# Patient Record
Sex: Female | Born: 1960 | Race: White | Hispanic: No | Marital: Single
Health system: Midwestern US, Academic
[De-identification: ages and names within clinical notes are randomized; demographics above are authoritative.]

---

## 2017-01-01 MED ORDER — IOPAMIDOL 41 % IT SOLN
2.5 mL | Freq: Once | EPIDURAL | 0 refills | Status: CP
Start: 2017-01-01 — End: ?

## 2017-01-01 MED ORDER — DIAZEPAM 5 MG PO TAB
5 mg | ORAL | 0 refills | Status: DC | PRN
Start: 2017-01-01 — End: 2017-01-01

## 2017-01-01 MED ORDER — NORTRIPTYLINE 10 MG PO CAP
20 mg | ORAL_CAPSULE | Freq: Every evening | ORAL | 2 refills | Status: DC
Start: 2017-01-01 — End: 2017-07-02

## 2017-01-01 MED ORDER — TRIAMCINOLONE ACETONIDE 40 MG/ML IJ SUSP
80 mg | Freq: Once | EPIDURAL | 0 refills | Status: CP
Start: 2017-01-01 — End: ?

## 2017-01-24 MED ORDER — LIDOCAINE 5 % TP PTMD
1 | MEDICATED_PATCH | TOPICAL | 3 refills | 30.00000 days | Status: DC
Start: 2017-01-24 — End: 2018-10-06

## 2017-02-05 MED ORDER — TRIAMCINOLONE ACETONIDE 40 MG/ML IJ SUSP
80 mg | Freq: Once | EPIDURAL | 0 refills | Status: CP
Start: 2017-02-05 — End: ?

## 2017-02-05 MED ORDER — IOPAMIDOL 41 % IT SOLN
2.5 mL | Freq: Once | EPIDURAL | 0 refills | Status: CP
Start: 2017-02-05 — End: ?

## 2017-02-05 MED ORDER — DIAZEPAM 5 MG PO TAB
5 mg | ORAL | 0 refills | Status: DC | PRN
Start: 2017-02-05 — End: 2017-02-06

## 2017-02-20 MED ORDER — PREGABALIN 200 MG PO CAP
200 mg | ORAL_CAPSULE | Freq: Two times a day (BID) | ORAL | 2 refills | Status: DC
Start: 2017-02-20 — End: 2017-05-29

## 2017-03-05 ENCOUNTER — Encounter: Admit: 2017-03-05 | Discharge: 2017-03-05

## 2017-03-05 NOTE — Telephone Encounter
Nurse received v/m from [facility] asking for callback to try to move her follow up visit to sooner date.    Nurse returned call, Kristen Vaughn c/o continued and increased neck pain and headaches, stated the injection she received on 5/15 (C6-C7 ESI) did not help.  Nurse assisted patient with moving up her appointment and will route C-Spine MRI results from 4/3 for Dr. Katrinka BlazingSmith to review [name]- Kristen Vaughn said at her injection appointment he mentioned a possible surgical referral, she would like to get the ball rolling on this if needed.    Nurse to f/u with Kristen Vaughn by phone after Dr. Katrinka BlazingSmith reviews MRI.  Kristen Vaughn v/u and agrees to this plan.

## 2017-03-08 ENCOUNTER — Encounter: Admit: 2017-03-08 | Discharge: 2017-03-08

## 2017-03-08 DIAGNOSIS — M4802 Spinal stenosis, cervical region: Principal | ICD-10-CM

## 2017-03-08 NOTE — Telephone Encounter
[facility] let [facility] know Dr. 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  Patient is agreeable.  Nurse will enter referral & have scheduling contact patient. Pt requested to cancel 6/26 f/u with Dr. Katrinka BlazingSmith at this time.  Pam v/u and expressed satisfaction with this plan.

## 2017-03-12 ENCOUNTER — Encounter: Admit: 2017-03-12 | Discharge: 2017-03-12

## 2017-03-12 NOTE — Telephone Encounter
Neither Dr. Katrinka BlazingSmith or I realized this patient had seen Dr. Debroah LoopArnold for lbp surgical eval in July 2017.  A new referral was provided to Dr. Liam RogersBunch for cervical stenosis.  .  When the patient called to schedule she was told she needs to stay with Dr. Debroah LoopArnold.  The patient was upset, left v/m for the nurse.  Nurse called [facility], explained what happened.  Rinaldo [facility]amela was insistent that she wanted to see Dr. Liam RogersBunch for this appointment.  Email was sent to [facility] to let her know this patient has requested to transfer her care.  Nurse let Rinaldo Cloudamela know [facility] discuss.  Rinaldo CloudPamela v/u.

## 2017-03-19 ENCOUNTER — Encounter: Admit: 2017-03-19 | Discharge: 2017-03-19

## 2017-03-19 NOTE — Telephone Encounter
Attempt to reach patient to discuss referral to Dr. Liam RogersBunch for cervical stenosis. Will assist with scheduling.

## 2017-04-05 ENCOUNTER — Encounter: Admit: 2017-04-05 | Discharge: 2017-04-05

## 2017-04-05 DIAGNOSIS — M4802 Spinal stenosis, cervical region: Principal | ICD-10-CM

## 2017-04-17 ENCOUNTER — Encounter: Admit: 2017-04-17 | Discharge: 2017-04-17

## 2017-04-17 ENCOUNTER — Ambulatory Visit: Admit: 2017-04-17 | Discharge: 2017-04-17

## 2017-04-17 ENCOUNTER — Ambulatory Visit: Admit: 2017-04-17 | Discharge: 2017-04-17 | Payer: MEDICARE

## 2017-04-17 DIAGNOSIS — G959 Disease of spinal cord, unspecified: ICD-10-CM

## 2017-04-17 DIAGNOSIS — J189 Pneumonia, unspecified organism: ICD-10-CM

## 2017-04-17 DIAGNOSIS — K5900 Constipation, unspecified: ICD-10-CM

## 2017-04-17 DIAGNOSIS — G5600 Carpal tunnel syndrome, unspecified upper limb: ICD-10-CM

## 2017-04-17 DIAGNOSIS — I10 Essential (primary) hypertension: ICD-10-CM

## 2017-04-17 DIAGNOSIS — M4802 Spinal stenosis, cervical region: Principal | ICD-10-CM

## 2017-04-17 DIAGNOSIS — M5020 Other cervical disc displacement, unspecified cervical region: ICD-10-CM

## 2017-04-17 DIAGNOSIS — E785 Hyperlipidemia, unspecified: ICD-10-CM

## 2017-04-17 DIAGNOSIS — M797 Fibromyalgia: ICD-10-CM

## 2017-04-17 DIAGNOSIS — M5412 Radiculopathy, cervical region: ICD-10-CM

## 2017-04-17 NOTE — Progress Notes
SPINE CENTER HISTORY AND PHYSICAL      CHIEF COMPLAINT     Chief Complaint   Patient presents with   ??? Neck - Pain          HISTORY OF PRESENT ILLNESS   Kristen Vaughn is a 56 y.o. female.  Is a 56 year old female comes today for further evaluation of neck pain and bilateral arm pain and paresthesias.  Patient notes that this is been going on for quite some time.  She has had a recent intralaminar injection with Dr. Katrinka Blazing without significant long-lasting relief.  Patient notes that she additionally has some pain in her low back.  Patient notes that her neck and upper extremity symptoms are worse.  She has difficulty using her hands for fine motor test.  She has had increasing difficulty with writing and doing her buttons.  She notes that she does have some difficulty with balance as well.  She walks with a single-point cane.  Patient notes that she is trying to stop smoking.  She is down to 2 cigarettes a day.  She notes paresthesias in her thumb index and long finger.  She has significant neck pain at times with pain that radiates down into her right shoulder.  She notes that turning her head really causes the pain to increase.  No previous cervical spine operations patient notes that she has had issues dropping items and holding onto items she did have a recent EMG nerve conduction study completed showing right C6 and C7 radiculopathies in addition to the left C6 radiculopathy    Arm/Leg Pain (%) / Neck/Back Pain (%) -neck and bilateral hand paresthesias and weakness  Previous Spine Operations -  no    NSAIDs/Pain Meds -  Hydrocodone, celebrex  Physical Therapy -  Yes   Injections -  yes          PAST MEDICAL HISTORY     Past Medical History:   Diagnosis Date   ??? Carpal tunnel syndrome    ??? Chest pain    ??? Constipation    ??? Essential hypertension    ??? Fibromyalgia    ??? Hyperlipemia 08/09/2009   ??? Pneumonia        PAST SURGICAL HISTORY     Past Surgical History:   Procedure Laterality Date   ??? HYSTERECTOMY FAMILY HISTORY   family history includes Cancer in her sister and another family member; Coronary Artery Disease in her father, mother, and other family members; Premature Heart Disease in an other family member; Stroke in an other family member.    SOCIAL HISTORY     Social History     Social History   ??? Marital status: Single     Spouse name: N/A   ??? Number of children: N/A   ??? Years of education: N/A     Occupational History   ??? Not on file.     Social History Main Topics   ??? Smoking status: Current Every Day Smoker     Years: 33.00     Types: Cigarettes     Last attempt to quit: 08/08/2009   ??? Smokeless tobacco: Never Used   ??? Alcohol use 0.0 oz/week      Comment: social   ??? Drug use: No   ??? Sexual activity: Not on file     Other Topics Concern   ??? Not on file     Social History Narrative   ??? No narrative on file  ALLERGIES     Allergies   Allergen Reactions   ??? Ascorbic Acid ANAPHYLAXIS   ??? Strawberry UNKNOWN       MEDICATIONS     Current Outpatient Prescriptions:   ???  albuterol (PROAIR HFA) 90 mcg/actuation inhaler, Inhale 2 Puffs by mouth into the lungs every 6 hours as needed for Wheezing or Shortness of Breath. Shake well before use., Disp: , Rfl:   ???  celecoxib (CELEBREX) 200 mg capsule, Take 200 mg by mouth daily., Disp: , Rfl:   ???  desvenlafaxine(+) (PRISTIQ) 50 mg tablet, Take 50 mg by mouth daily., Disp: , Rfl:   ???  HYDROcodone/acetaminophen (NORCO) 5/325 mg tablet, Take 1 tablet by mouth every 4 hours as needed for Pain Indications: PAIN, Disp: 20 tablet, Rfl: 0  ???  HYDROcodone/acetaminophen(+) (NORCO) 10/325 mg tablet, Take 1 tablet by mouth at bedtime daily, Disp: , Rfl: 0  ???  hydrOXYzine (ATARAX) 10 mg tablet, Take 10 mg by mouth three times daily as needed for Itching., Disp: , Rfl:   ???  lidocaine (LIDODERM) 5 % topical patch, Apply 1 patch topically to affected area every 24 hours. Apply patch for 12 hours, then remove for 12 hours before repeating., Disp: 30 patch, Rfl: 3 ???  lidocaine/prilocaine (EMLA) 2.5/2.5 % topical cream, APPLY A SMALL AMOUNT TO AFFECTED AREA FOUR TIMES DAILY AS NEEDED, Disp: 30 g, Rfl: 3  ???  lisinopril (PRINIVIL; ZESTRIL) 10 mg tablet, Take 10 mg by mouth daily., Disp: , Rfl:   ???  LORazepam (ATIVAN) 0.5 mg tablet, TAKE 1 TABLET BY MOUTH TWICE DAILY AS NEEDED FOR ANXIETY, Disp: , Rfl: 0  ???  LORazepam (ATIVAN) 1 mg tablet, , Disp: , Rfl:   ???  mirtazapine (REMERON) 15 mg tablet, TAKE 1 TABLET BY MOUTH AT BEDTIME DO NOT TAKE WITH PAIN PILLS, ANXIETY MEDICATIONS OR MUSCLE RELAXER, Disp: , Rfl: 2  ???  mirtazapine (REMERON) 45 mg tablet, Take 45 mg by mouth at bedtime daily., Disp: , Rfl:   ???  nortriptyline (PAMELOR) 10 mg capsule, Take 2 capsules by mouth at bedtime daily., Disp: 60 capsule, Rfl: 2  ???  oxyCODONE/acetaminophen (PERCOCET) 5/325 mg tablet, Take 1 Tab by mouth every 4 hours as needed for Pain, Disp: , Rfl:   ???  pregabalin (LYRICA) 200 mg capsule, Take 1 capsule by mouth twice daily., Disp: 60 capsule, Rfl: 2  ???  simvastatin (ZOCOR) 20 mg tablet, Take 20 mg by mouth at bedtime daily., Disp: , Rfl:   ???  tiZANidine (ZANAFLEX) 4 mg tablet, Take 4 mg by mouth every 6 hours as needed., Disp: , Rfl:   ???  TRINTELLIX 10 mg tablet, TAKE 1 TABLET BY MOUTH EVERY DAY, Disp: , Rfl: 6    REVIEW OF SYSTEMS   Review of Systems   Constitutional: Positive for activity change and diaphoresis.   HENT: Positive for rhinorrhea and trouble swallowing.    Eyes: Positive for discharge and visual disturbance.   Respiratory: Positive for stridor.    Cardiovascular: Positive for palpitations.   Gastrointestinal: Positive for constipation, nausea and rectal pain.   Endocrine: Positive for heat intolerance.   Genitourinary: Positive for decreased urine volume and frequency.   Musculoskeletal: Positive for arthralgias, back pain, gait problem, joint swelling, myalgias, neck pain and neck stiffness.   Allergic/Immunologic: Positive for food allergies and immunocompromised state. Neurological: Positive for dizziness, weakness, numbness and headaches.   Psychiatric/Behavioral: Positive for confusion, decreased concentration and sleep disturbance. The patient is nervous/anxious.  All other systems reviewed and are negative.      PHYSICAL EXAM     Vitals:    04/17/17 0946   BP: 127/83   Pulse: 64   SpO2: 99%   Weight: 98.4 kg (217 lb)   Height: 170.2 cm (67)     Oswestry Total Score:: 68  Pain Score: Six  Body mass index is 33.99 kg/m???.    Constitutional: Alert, NAD  Psychiatric: Mood and affect appropriate  Eyes: EOMI  Respiratory: Unlabored breathing  Cardiovascular: Regular rate  Skin: No rashes or open wounds appreciated on neck  Musculoskeletal: 5/5 strength throughout bilat UEs (shoulder abduction, elbow flex/ext, wrist flex/ext, grip, hand intrinsics) with exception of decreased strength with grip in addition to more on the right-hand side elbow flexion and extension as well as hand intrinsic function, neck ROM limited secondary to pain  Neurologic: Sensation intact to light touch C5-T1 except paresthesias in her bilateral hands mainly in a C6 and C7 distribution, no hyperreflexia, negative Hoffmann's, negative inverted radial reflex, grossly positive Romberg in addition to slowed rapid alternating hand movements      RADIOGRAPHIC EVALUATION   Imaging was reviewed demonstrating significant disc space collapse and degeneration at C6-7.  Patient has rather marked stenosis at C6-7 with deformation of the cord and to a slightly less degree at C5-6.  I think about this is much less significant.  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  I think that based on the patient's symptoms and complaints I think she does have evidence of some early myelopathy.  That said I do not see any cord signal change on her MRI.  I do not think that this is an emergency.  I think that she would be a surgical candidate if she was able to stop smoking.  I would plan on doing an ACDF at C5-6 and C6-7.  I think that there is enough stenosis at C5-6 to warrant doing this one as well.  Ultimately I think that if her symptoms progressed we might proceed more quickly but I think that as long as her symptoms are where they are now I would really recommend that she try to stop smoking she is very confident that she will be able to do this.  I think that as soon as she was able to do that we put on the surgical schedule.  We will plan on seeing her back in a month to see if she has been able to stop at that point I think if she has been able to and will plan on going ahead and scheduling her for surgery.    This note was created using Dragon, a Chemical engineer.  Please contact my office for any clarification of documentation.

## 2017-05-08 ENCOUNTER — Encounter: Admit: 2017-05-08 | Discharge: 2017-05-08

## 2017-05-29 ENCOUNTER — Encounter: Admit: 2017-05-29 | Discharge: 2017-05-29

## 2017-05-29 MED ORDER — PREGABALIN 200 MG PO CAP
200 mg | ORAL_CAPSULE | Freq: Two times a day (BID) | ORAL | 2 refills | Status: AC
Start: 2017-05-29 — End: 2017-08-21

## 2017-05-29 NOTE — Telephone Encounter
Received faxed request for refill from [facility] for Lyrica 200mg  capsules, dispense qty 60, take 1 capsule by mouth twice daily 2 refills.    Written orders given from Dr. Katrinka BlazingSmith.

## 2017-06-17 ENCOUNTER — Encounter: Admit: 2017-06-17 | Discharge: 2017-06-17

## 2017-06-17 NOTE — Telephone Encounter
RN received v/m from [facility] late Friday afternoon asking Dr. Katrinka Blazing to prescribe something for her numbness, tingling & pain in hands - says Nortriptyline (20mg ) no longer working, unable to sleep well as this wakes her up through the night.    Routing to Dr. Katrinka Blazing for review, will f/u with patient by phone. Of note, patient already taking Lyrica 200mg  bid and has f/u with Dr. Liam Rogers on 06/28/17

## 2017-06-18 NOTE — Telephone Encounter
Kristen Rim, MD  Willette Alma, RN   Caller: Unspecified (4 days ago, 3:58 PM)            Recommend increasing to 30 mg qhs. If tolerated, may take up to 40 mg. If she has side effects we can wean off.        RN returned call, left detailed message on identified v/m - gave patient instructions OK to increase Nortriptyline from 20mg  qhs to 30mg  qhs and if this does not work and patient is not experiencing side effects may increase to 40mg  qhs - advised patient to call us if she has any side effects & she can be weaned off this med.  Also left direct # for nurse [phone].

## 2017-06-28 ENCOUNTER — Ambulatory Visit: Admit: 2017-06-28 | Discharge: 2017-06-29 | Payer: MEDICARE

## 2017-06-28 ENCOUNTER — Encounter: Admit: 2017-06-28 | Discharge: 2017-06-28

## 2017-06-28 NOTE — Progress Notes
SPINE CENTER CLINIC NOTE    Subjective     SUBJECTIVE   Kristen Vaughn is a 56 y.o. female who comes back in today for further evaluation of her neck and bilateral arm pain and hand paresthesias and weakness.  Patient notes that she continues to drop items.  She notes that she has difficulty with fine motor tasks including doing the buttons on her shirt.  Patient also struggling with a trigger finger on her right hand.  Patient would like to proceed with surgical treatment at this time.  She notes that she has stopped smoking for roughly 24 days.         REVIEW OF SYSTEMS   Review of Systems   Constitutional: Positive for activity change, appetite change, diaphoresis and fatigue.   HENT: Positive for mouth sores and trouble swallowing.    Eyes: Positive for photophobia and itching.   Respiratory: Positive for apnea.    Gastrointestinal: Positive for constipation, nausea and rectal pain.   Endocrine: Positive for cold intolerance and heat intolerance.   Genitourinary: Positive for frequency.   Musculoskeletal: Positive for arthralgias, back pain, gait problem, joint swelling, myalgias, neck pain and neck stiffness.   Allergic/Immunologic: Positive for food allergies.   Neurological: Positive for tremors, weakness, light-headedness, numbness and headaches.   Psychiatric/Behavioral: Positive for agitation, behavioral problems, confusion, decreased concentration, dysphoric mood, hallucinations, self-injury, sleep disturbance and suicidal ideas (no plan in place ). The patient is nervous/anxious and is hyperactive.    All other systems reviewed and are negative.      ALLERGIES     Allergies   Allergen Reactions   ??? Ascorbic Acid ANAPHYLAXIS   ??? Strawberry UNKNOWN       MEDICATIONS     Current Outpatient Prescriptions on File Prior to Visit   Medication Sig Dispense Refill   ??? albuterol (PROAIR HFA) 90 mcg/actuation inhaler Inhale 2 Puffs by mouth into the lungs every 6 hours as needed for Wheezing or Shortness of Breath. Shake well before use.     ??? celecoxib (CELEBREX) 200 mg capsule Take 200 mg by mouth daily.     ??? desvenlafaxine(+) (PRISTIQ) 50 mg tablet Take 50 mg by mouth daily.     ??? HYDROcodone/acetaminophen (NORCO) 5/325 mg tablet Take 1 tablet by mouth every 4 hours as needed for Pain Indications: PAIN 20 tablet 0   ??? HYDROcodone/acetaminophen(+) (NORCO) 10/325 mg tablet Take 1 tablet by mouth at bedtime daily  0   ??? hydrOXYzine (ATARAX) 10 mg tablet Take 10 mg by mouth three times daily as needed for Itching.     ??? lidocaine (LIDODERM) 5 % topical patch Apply 1 patch topically to affected area every 24 hours. Apply patch for 12 hours, then remove for 12 hours before repeating. 30 patch 3   ??? lidocaine/prilocaine (EMLA) 2.5/2.5 % topical cream APPLY A SMALL AMOUNT TO AFFECTED AREA FOUR TIMES DAILY AS NEEDED 30 g 3   ??? lisinopril (PRINIVIL; ZESTRIL) 10 mg tablet Take 10 mg by mouth daily.     ??? LORazepam (ATIVAN) 0.5 mg tablet TAKE 1 TABLET BY MOUTH TWICE DAILY AS NEEDED FOR ANXIETY  0   ??? LORazepam (ATIVAN) 1 mg tablet      ??? mirtazapine (REMERON) 15 mg tablet TAKE 1 TABLET BY MOUTH AT BEDTIME DO NOT TAKE WITH PAIN PILLS, ANXIETY MEDICATIONS OR MUSCLE RELAXER  2   ??? mirtazapine (REMERON) 45 mg tablet Take 45 mg by mouth at bedtime daily.     ???  nortriptyline (PAMELOR) 10 mg capsule Take 2 capsules by mouth at bedtime daily. 60 capsule 2   ??? oxyCODONE/acetaminophen (PERCOCET) 5/325 mg tablet Take 1 Tab by mouth every 4 hours as needed for Pain     ??? pregabalin (LYRICA) 200 mg capsule Take one capsule by mouth twice daily. 60 capsule 2   ??? simvastatin (ZOCOR) 20 mg tablet Take 20 mg by mouth at bedtime daily.     ??? tiZANidine (ZANAFLEX) 4 mg tablet Take 4 mg by mouth every 6 hours as needed.     ??? TRINTELLIX 10 mg tablet TAKE 1 TABLET BY MOUTH EVERY DAY  6     No current facility-administered medications on file prior to visit.        PHYSICAL EXAM     Vitals:    06/28/17 1505   BP: 121/79   Pulse: 66   SpO2: 98% Weight: 101.8 kg (224 lb 6.4 oz)   Height: 170.2 cm (67)     Oswestry Total Score:: 78  Pain Score: Seven  Body mass index is 35.15 kg/m???.    Constitutional: Alert, NAD  Head: Atraumatic  Eyes: EOMI  Respiratory: Unlabored breathing  Cardiovascular: Regular rate  Skin: No rashes or open wounds appreciated on back  Musculoskeletal: Strength stable  Neurologic: Sensation stable    RADIOLOGY   On review the patient's MRI again patient has evidence of more stenosis at C6-7 but also at C5-6.         ASSESSMENT / PLAN   Kristen Vaughn is a 56 y.o. female with evidence of early cervical myelopathy and cervical radiculopathy in the setting of worse stenosis at C6-7 but also at C5-6      I am the patient would like to try surgical treatment at this time.  She is already tried physical therapy and injections.  I discussed with her that she needs to continue to not smoke.  Were going to go ahead and let her pick out a surgical date today.  Prior to her preoperative visit we need to get a urine cotinine test to ensure that this is negative.  I discussed with her and been very upfront with her that if this is positive we would delay her surgery.  I think that the plan would be for an ACDF at C5-6 and C6-7.  I think it would make some sense to go ahead and address C5-6 although the stenosis is not quite as bad there is at C6-7.  I discussed with her that I think that a number of her complaints with her arms are secondary to other pain generators and I think that there is a chance she would continue to have pain following this.  I do think that she has significant stenosis and a portion of her pain is likely from this but it is hard to say exactly how much better she will be.  Goal of surgery is to stop the progression of the disease.  I think that she is a very clear expectation of her postoperative goals and prognosis.  She may call with any questions or concerns leading up to surgery.  We will also need to get a DEXA scan to evaluate her bone density.    This note was created using Dragon, a Chemical engineer.  Please contact my office for any clarification of documentation.

## 2017-06-29 DIAGNOSIS — M810 Age-related osteoporosis without current pathological fracture: Secondary | ICD-10-CM

## 2017-06-29 DIAGNOSIS — M5412 Radiculopathy, cervical region: Secondary | ICD-10-CM

## 2017-06-29 DIAGNOSIS — M4802 Spinal stenosis, cervical region: Principal | ICD-10-CM

## 2017-06-29 DIAGNOSIS — G959 Disease of spinal cord, unspecified: ICD-10-CM

## 2017-07-02 MED ORDER — NORTRIPTYLINE 10 MG PO CAP
ORAL_CAPSULE | Freq: Every evening | 2 refills
Start: 2017-07-02 — End: ?

## 2017-07-02 MED ORDER — NORTRIPTYLINE 25 MG PO CAP
25 mg | ORAL_CAPSULE | Freq: Every evening | ORAL | 0 refills | Status: AC
Start: 2017-07-02 — End: 2017-07-26

## 2017-07-02 NOTE — Telephone Encounter
RN received refill request from pharmacy for Nortriptyline 10mg  capsules, take 2 capsules at bedtime.    06/17/17 Dr. verbal order for patient to increase dose from 20mg  to 30mg  for increased symptoms.  Per patient phone call today, she has not increased dose yet but still experiencing increased numbness, tingling & pain in hands at night.      Verbal order from Dr Sandi Mealy, Nortriptyline 25mg  capsule, take 1 capsule by mouth at bedtime, if tolerated & no side effects after 1 week, may take 2 capsules at bedtime, dispense qty 45 capsules.

## 2017-07-15 ENCOUNTER — Ambulatory Visit: Admit: 2017-07-15 | Discharge: 2017-07-15 | Payer: MEDICARE

## 2017-07-15 DIAGNOSIS — M5412 Radiculopathy, cervical region: Principal | ICD-10-CM

## 2017-07-15 DIAGNOSIS — G959 Disease of spinal cord, unspecified: ICD-10-CM

## 2017-07-15 DIAGNOSIS — M4802 Spinal stenosis, cervical region: ICD-10-CM

## 2017-07-19 ENCOUNTER — Ambulatory Visit: Admit: 2017-07-19 | Discharge: 2017-07-19 | Payer: MEDICARE

## 2017-07-19 DIAGNOSIS — M810 Age-related osteoporosis without current pathological fracture: Principal | ICD-10-CM

## 2017-07-19 DIAGNOSIS — M5412 Radiculopathy, cervical region: ICD-10-CM

## 2017-07-19 DIAGNOSIS — M4802 Spinal stenosis, cervical region: ICD-10-CM

## 2017-07-19 DIAGNOSIS — G959 Disease of spinal cord, unspecified: ICD-10-CM

## 2017-07-26 ENCOUNTER — Encounter: Admit: 2017-07-26 | Discharge: 2017-07-26

## 2017-07-26 MED ORDER — NORTRIPTYLINE 25 MG PO CAP
25 mg | ORAL_CAPSULE | Freq: Every evening | ORAL | 0 refills | Status: AC
Start: 2017-07-26 — End: 2017-08-21

## 2017-08-20 ENCOUNTER — Encounter: Admit: 2017-08-20 | Discharge: 2017-08-20

## 2017-08-21 MED ORDER — LYRICA 200 MG PO CAP
ORAL_CAPSULE | Freq: Two times a day (BID) | 2 refills | Status: AC
Start: 2017-08-21 — End: 2017-11-25

## 2017-08-21 MED ORDER — NORTRIPTYLINE 25 MG PO CAP
25 mg | ORAL_CAPSULE | Freq: Every evening | ORAL | 0 refills | Status: AC
Start: 2017-08-21 — End: 2017-10-15

## 2017-09-12 ENCOUNTER — Encounter: Admit: 2017-09-12 | Discharge: 2017-09-12

## 2017-09-12 DIAGNOSIS — M818 Other osteoporosis without current pathological fracture: Principal | ICD-10-CM

## 2017-09-12 DIAGNOSIS — Z8639 Personal history of other endocrine, nutritional and metabolic disease: ICD-10-CM

## 2017-09-12 DIAGNOSIS — G959 Disease of spinal cord, unspecified: ICD-10-CM

## 2017-09-12 DIAGNOSIS — R937 Abnormal findings on diagnostic imaging of other parts of musculoskeletal system: ICD-10-CM

## 2017-09-12 DIAGNOSIS — R531 Weakness: ICD-10-CM

## 2017-09-12 DIAGNOSIS — E1169 Type 2 diabetes mellitus with other specified complication: ICD-10-CM

## 2017-09-12 NOTE — Telephone Encounter
Bone density completed, will need treatment for osteopenia prior surgery with Dr. Liam RogersBunch. Will place order for labs for work up.

## 2017-09-12 NOTE — Telephone Encounter
Patient requesting labs be completed closer to home. Will follow up and get information to facility requested.

## 2017-09-27 NOTE — Telephone Encounter
Faxed lab orders to PCP office [phone]

## 2017-10-15 ENCOUNTER — Encounter: Admit: 2017-10-15 | Discharge: 2017-10-15

## 2017-10-15 ENCOUNTER — Ambulatory Visit: Admit: 2017-10-15 | Discharge: 2017-10-15

## 2017-10-15 ENCOUNTER — Ambulatory Visit: Admit: 2017-10-15 | Discharge: 2017-10-15 | Payer: MEDICARE

## 2017-10-15 DIAGNOSIS — E785 Hyperlipidemia, unspecified: ICD-10-CM

## 2017-10-15 DIAGNOSIS — M810 Age-related osteoporosis without current pathological fracture: Principal | ICD-10-CM

## 2017-10-15 DIAGNOSIS — I10 Essential (primary) hypertension: ICD-10-CM

## 2017-10-15 DIAGNOSIS — M797 Fibromyalgia: ICD-10-CM

## 2017-10-15 DIAGNOSIS — G5600 Carpal tunnel syndrome, unspecified upper limb: ICD-10-CM

## 2017-10-15 DIAGNOSIS — Z8639 Personal history of other endocrine, nutritional and metabolic disease: ICD-10-CM

## 2017-10-15 DIAGNOSIS — J189 Pneumonia, unspecified organism: ICD-10-CM

## 2017-10-15 DIAGNOSIS — K5900 Constipation, unspecified: ICD-10-CM

## 2017-10-15 LAB — PARATHYROID HORMONE: Lab: 49 pg/mL (ref 10–65)

## 2017-10-15 MED ORDER — NORTRIPTYLINE 25 MG PO CAP
ORAL_CAPSULE | Freq: Every evening | 0 refills | Status: AC
Start: 2017-10-15 — End: 2018-10-06

## 2017-10-15 MED ORDER — CHOLECALCIFEROL (VITAMIN D3) 4,000 UNIT PO CAP
1 | ORAL_CAPSULE | Freq: Every day | ORAL | 3 refills | 84.00000 days | Status: AC
Start: 2017-10-15 — End: 2017-12-18
  Filled 2017-10-22 (×2): qty 12, 28d supply, fill #1

## 2017-10-15 MED ORDER — CHOLECALCIFEROL (VITAMIN D3) 50,000 UNIT PO CAP
50000 [IU] | ORAL_CAPSULE | ORAL | 0 refills | 84.00000 days | Status: AC | PRN
Start: 2017-10-15 — End: 2017-12-18

## 2017-10-15 MED ORDER — TERIPARATIDE 20 MCG/DOSE - 600 MCG/2.4 ML SC PNIJ
20 ug | Freq: Every day | SUBCUTANEOUS | 6 refills | 28.00000 days | Status: AC
Start: 2017-10-15 — End: 2019-05-26
  Filled 2017-10-22 (×2): qty 2, 28d supply, fill #1

## 2017-10-21 ENCOUNTER — Encounter: Admit: 2017-10-21 | Discharge: 2017-10-21

## 2017-10-21 MED ORDER — TERIPARATIDE 20 MCG/DOSE - 600 MCG/2.4 ML SC PNIJ
20 ug | Freq: Every day | SUBCUTANEOUS | 6 refills | Status: CN
Start: 2017-10-21 — End: ?

## 2017-10-21 MED ORDER — PEN NEEDLE, DIABETIC 31 GAUGE X 3/16" MISC NDLE
1 | 3 refills | 30.00000 days | Status: AC | PRN
Start: 2017-10-21 — End: ?
  Filled 2017-10-22 (×2): qty 300, 100d supply, fill #1

## 2017-10-22 ENCOUNTER — Encounter: Admit: 2017-10-22 | Discharge: 2017-10-22

## 2017-10-23 ENCOUNTER — Encounter: Admit: 2017-10-23 | Discharge: 2017-10-23

## 2017-10-25 ENCOUNTER — Encounter: Admit: 2017-10-25 | Discharge: 2017-10-25

## 2017-10-28 ENCOUNTER — Encounter: Admit: 2017-10-28 | Discharge: 2017-10-28

## 2017-10-30 ENCOUNTER — Encounter: Admit: 2017-10-30 | Discharge: 2017-10-30

## 2017-10-31 ENCOUNTER — Encounter: Admit: 2017-10-31 | Discharge: 2017-10-31

## 2017-11-05 ENCOUNTER — Encounter: Admit: 2017-11-05 | Discharge: 2017-11-05

## 2017-11-06 ENCOUNTER — Encounter: Admit: 2017-11-06 | Discharge: 2017-11-06

## 2017-11-13 ENCOUNTER — Encounter: Admit: 2017-11-13 | Discharge: 2017-11-13

## 2017-11-20 ENCOUNTER — Encounter: Admit: 2017-11-20 | Discharge: 2017-11-20

## 2017-11-20 MED ORDER — CHOLECALCIFEROL (VITAMIN D3) 50,000 UNIT PO CAP
50000 [IU] | ORAL_CAPSULE | ORAL | 0 refills | PRN
Start: 2017-11-20 — End: ?

## 2017-11-20 MED FILL — TERIPARATIDE 20 MCG/DOSE - 600 MCG/2.4 ML SC PNIJ: 20 mcg/dose - 600 mcg/2.4 mL | SUBCUTANEOUS | 28 days supply | Qty: 2 | Fill #2 | Status: AC

## 2017-11-22 ENCOUNTER — Encounter: Admit: 2017-11-22 | Discharge: 2017-11-22

## 2017-11-25 MED ORDER — LYRICA 200 MG PO CAP
ORAL_CAPSULE | Freq: Two times a day (BID) | 1 refills | Status: AC
Start: 2017-11-25 — End: 2017-12-18

## 2017-12-02 ENCOUNTER — Encounter: Admit: 2017-12-02 | Discharge: 2017-12-02

## 2017-12-02 MED ORDER — LYRICA 200 MG PO CAP
ORAL_CAPSULE | Freq: Two times a day (BID) | 0 refills
Start: 2017-12-02 — End: ?

## 2017-12-03 ENCOUNTER — Encounter: Admit: 2017-12-03 | Discharge: 2017-12-03

## 2017-12-11 ENCOUNTER — Encounter: Admit: 2017-12-11 | Discharge: 2017-12-11

## 2017-12-11 MED FILL — TERIPARATIDE 20 MCG/DOSE - 600 MCG/2.4 ML SC PNIJ: 20 mcg/dose - 600 mcg/2.4 mL | SUBCUTANEOUS | 28 days supply | Qty: 2 | Fill #3 | Status: AC

## 2017-12-12 ENCOUNTER — Encounter: Admit: 2017-12-12 | Discharge: 2017-12-12

## 2017-12-15 ENCOUNTER — Encounter: Admit: 2017-12-15 | Discharge: 2017-12-15

## 2017-12-16 ENCOUNTER — Encounter: Admit: 2017-12-16 | Discharge: 2017-12-16

## 2017-12-16 MED ORDER — CHOLECALCIFEROL (VITAMIN D3) 2,000 UNIT PO TAB
4000 [IU] | ORAL_TABLET | Freq: Every day | ORAL | 3 refills | 84.00000 days | Status: DC
Start: 2017-12-16 — End: 2017-12-18
  Filled 2017-12-17 (×2): qty 360, 30d supply, fill #1

## 2017-12-17 ENCOUNTER — Encounter: Admit: 2017-12-17 | Discharge: 2017-12-17

## 2017-12-18 ENCOUNTER — Encounter: Admit: 2017-12-18 | Discharge: 2017-12-18

## 2017-12-18 ENCOUNTER — Ambulatory Visit: Admit: 2017-12-18 | Discharge: 2017-12-19 | Payer: MEDICARE

## 2017-12-18 DIAGNOSIS — E785 Hyperlipidemia, unspecified: ICD-10-CM

## 2017-12-18 DIAGNOSIS — J189 Pneumonia, unspecified organism: ICD-10-CM

## 2017-12-18 DIAGNOSIS — K5900 Constipation, unspecified: ICD-10-CM

## 2017-12-18 DIAGNOSIS — G959 Disease of spinal cord, unspecified: ICD-10-CM

## 2017-12-18 DIAGNOSIS — I10 Essential (primary) hypertension: ICD-10-CM

## 2017-12-18 DIAGNOSIS — M797 Fibromyalgia: ICD-10-CM

## 2017-12-18 DIAGNOSIS — G5600 Carpal tunnel syndrome, unspecified upper limb: ICD-10-CM

## 2017-12-18 MED ORDER — PREGABALIN 200 MG PO CAP
200 mg | ORAL_CAPSULE | Freq: Two times a day (BID) | ORAL | 1 refills | Status: AC
Start: 2017-12-18 — End: 2020-01-04

## 2017-12-19 DIAGNOSIS — M722 Plantar fascial fibromatosis: ICD-10-CM

## 2017-12-19 DIAGNOSIS — M65341 Trigger finger, right ring finger: Principal | ICD-10-CM

## 2017-12-25 ENCOUNTER — Encounter: Admit: 2017-12-25 | Discharge: 2017-12-25

## 2017-12-26 ENCOUNTER — Encounter: Admit: 2017-12-26 | Discharge: 2017-12-26

## 2017-12-27 ENCOUNTER — Encounter: Admit: 2017-12-27 | Discharge: 2017-12-27

## 2017-12-27 MED FILL — TERIPARATIDE 20 MCG/DOSE - 600 MCG/2.4 ML SC PNIJ: 20 mcg/dose - 600 mcg/2.4 mL | SUBCUTANEOUS | 28 days supply | Qty: 2 | Fill #4 | Status: AC

## 2018-01-20 ENCOUNTER — Encounter: Admit: 2018-01-20 | Discharge: 2018-01-20

## 2018-01-20 MED ORDER — CHOLECALCIFEROL (VITAMIN D3) 2,000 UNIT PO TAB
4000 [IU] | ORAL_TABLET | Freq: Every day | ORAL | 3 refills
Start: 2018-01-20 — End: ?

## 2018-01-21 ENCOUNTER — Encounter: Admit: 2018-01-21 | Discharge: 2018-01-21

## 2018-01-21 MED FILL — TERIPARATIDE 20 MCG/DOSE - 600 MCG/2.4 ML SC PNIJ: 20 mcg/dose - 600 mcg/2.4 mL | SUBCUTANEOUS | 28 days supply | Qty: 2 | Fill #5 | Status: AC

## 2018-01-22 ENCOUNTER — Encounter: Admit: 2018-01-22 | Discharge: 2018-01-22

## 2018-01-24 ENCOUNTER — Encounter: Admit: 2018-01-24 | Discharge: 2018-01-24

## 2018-01-24 MED ORDER — CHOLECALCIFEROL (VITAMIN D3) 50,000 UNIT PO CAP
50000 [IU] | ORAL_CAPSULE | ORAL | 0 refills | PRN
Start: 2018-01-24 — End: ?

## 2018-02-11 ENCOUNTER — Encounter: Admit: 2018-02-11 | Discharge: 2018-02-11

## 2018-02-12 ENCOUNTER — Encounter: Admit: 2018-02-12 | Discharge: 2018-02-12

## 2018-02-12 MED FILL — PEN NEEDLE, DIABETIC 31 GAUGE X 3/16" MISC NDLE: 31 gauge x 3/16" | 100 days supply | Qty: 300 | Fill #2 | Status: AC

## 2018-02-12 MED FILL — TERIPARATIDE 20 MCG/DOSE - 600 MCG/2.4 ML SC PNIJ: 20 mcg/dose - 600 mcg/2.4 mL | SUBCUTANEOUS | 28 days supply | Qty: 2 | Fill #6 | Status: AC

## 2018-02-13 ENCOUNTER — Encounter: Admit: 2018-02-13 | Discharge: 2018-02-13

## 2018-02-27 ENCOUNTER — Encounter: Admit: 2018-02-27 | Discharge: 2018-02-27

## 2018-03-06 ENCOUNTER — Encounter: Admit: 2018-03-06 | Discharge: 2018-03-06

## 2018-03-06 MED FILL — TERIPARATIDE 20 MCG/DOSE - 600 MCG/2.4 ML SC PNIJ: 20 mcg/dose - 600 mcg/2.4 mL | SUBCUTANEOUS | 28 days supply | Qty: 2 | Fill #7 | Status: AC

## 2018-03-07 ENCOUNTER — Encounter: Admit: 2018-03-07 | Discharge: 2018-03-07

## 2018-03-24 ENCOUNTER — Encounter: Admit: 2018-03-24 | Discharge: 2018-03-24

## 2018-03-25 ENCOUNTER — Encounter: Admit: 2018-03-25 | Discharge: 2018-03-25

## 2018-03-25 MED ORDER — TERIPARATIDE 20 MCG/DOSE - 600 MCG/2.4 ML SC PNIJ
20 ug | Freq: Every day | SUBCUTANEOUS | 6 refills
Start: 2018-03-25 — End: ?

## 2018-03-26 ENCOUNTER — Encounter: Admit: 2018-03-26 | Discharge: 2018-03-26

## 2018-04-07 ENCOUNTER — Encounter: Admit: 2018-04-07 | Discharge: 2018-04-07

## 2018-04-08 ENCOUNTER — Encounter: Admit: 2018-04-08 | Discharge: 2018-04-08

## 2018-05-12 ENCOUNTER — Encounter: Admit: 2018-05-12 | Discharge: 2018-05-12

## 2018-05-22 ENCOUNTER — Encounter: Admit: 2018-05-22 | Discharge: 2018-05-22

## 2018-05-22 DIAGNOSIS — M5020 Other cervical disc displacement, unspecified cervical region: Principal | ICD-10-CM

## 2018-05-23 ENCOUNTER — Encounter: Admit: 2018-05-23 | Discharge: 2018-05-23

## 2018-05-23 ENCOUNTER — Ambulatory Visit: Admit: 2018-05-23 | Discharge: 2018-05-23

## 2018-05-23 ENCOUNTER — Ambulatory Visit: Admit: 2018-05-23 | Discharge: 2018-05-23 | Payer: MEDICARE

## 2018-05-23 DIAGNOSIS — J189 Pneumonia, unspecified organism: ICD-10-CM

## 2018-05-23 DIAGNOSIS — M810 Age-related osteoporosis without current pathological fracture: ICD-10-CM

## 2018-05-23 DIAGNOSIS — M5020 Other cervical disc displacement, unspecified cervical region: ICD-10-CM

## 2018-05-23 DIAGNOSIS — K5900 Constipation, unspecified: ICD-10-CM

## 2018-05-23 DIAGNOSIS — E785 Hyperlipidemia, unspecified: ICD-10-CM

## 2018-05-23 DIAGNOSIS — M4802 Spinal stenosis, cervical region: ICD-10-CM

## 2018-05-23 DIAGNOSIS — I10 Essential (primary) hypertension: ICD-10-CM

## 2018-05-23 DIAGNOSIS — G959 Disease of spinal cord, unspecified: Secondary | ICD-10-CM

## 2018-05-23 DIAGNOSIS — M797 Fibromyalgia: ICD-10-CM

## 2018-05-23 DIAGNOSIS — G5600 Carpal tunnel syndrome, unspecified upper limb: ICD-10-CM

## 2018-06-02 ENCOUNTER — Ambulatory Visit: Admit: 2018-06-02 | Discharge: 2018-06-02 | Payer: MEDICARE

## 2018-06-02 ENCOUNTER — Ambulatory Visit: Admit: 2018-06-02 | Discharge: 2018-06-03

## 2018-06-02 DIAGNOSIS — M4802 Spinal stenosis, cervical region: ICD-10-CM

## 2018-06-02 DIAGNOSIS — M810 Age-related osteoporosis without current pathological fracture: ICD-10-CM

## 2018-06-02 DIAGNOSIS — G959 Disease of spinal cord, unspecified: Principal | ICD-10-CM

## 2018-06-10 ENCOUNTER — Encounter: Admit: 2018-06-10 | Discharge: 2018-06-10

## 2018-06-10 ENCOUNTER — Ambulatory Visit: Admit: 2018-06-10 | Discharge: 2018-06-10 | Payer: MEDICARE

## 2018-06-10 ENCOUNTER — Ambulatory Visit: Admit: 2018-06-10 | Discharge: 2018-06-10

## 2018-06-10 DIAGNOSIS — K5900 Constipation, unspecified: ICD-10-CM

## 2018-06-10 DIAGNOSIS — M797 Fibromyalgia: ICD-10-CM

## 2018-06-10 DIAGNOSIS — Z8639 Personal history of other endocrine, nutritional and metabolic disease: ICD-10-CM

## 2018-06-10 DIAGNOSIS — M810 Age-related osteoporosis without current pathological fracture: Principal | ICD-10-CM

## 2018-06-10 DIAGNOSIS — E785 Hyperlipidemia, unspecified: ICD-10-CM

## 2018-06-10 DIAGNOSIS — I10 Essential (primary) hypertension: ICD-10-CM

## 2018-06-10 DIAGNOSIS — G5600 Carpal tunnel syndrome, unspecified upper limb: ICD-10-CM

## 2018-06-10 DIAGNOSIS — J189 Pneumonia, unspecified organism: ICD-10-CM

## 2018-06-10 LAB — COMPREHENSIVE METABOLIC PANEL
Lab: 137 MMOL/L (ref 137–147)
Lab: 22 MMOL/L (ref 21–30)
Lab: 4.3 MMOL/L (ref 3.5–5.1)

## 2018-06-10 LAB — CBC
Lab: 13 % (ref 11–15)
Lab: 13 g/dL (ref 12.0–15.0)
Lab: 287 K/UL (ref 150–400)
Lab: 30 pg (ref 26–34)
Lab: 33 g/dL (ref 32.0–36.0)
Lab: 4.5 M/UL — ABNORMAL HIGH (ref 4.0–5.0)
Lab: 41 % (ref 36–45)
Lab: 5.6 K/UL (ref 4.5–11.0)
Lab: 90 FL (ref 80–100)

## 2018-06-10 LAB — TSH WITH FREE T4 REFLEX: Lab: 1.1 uU/mL (ref 0.35–5.00)

## 2018-06-10 LAB — PARATHYROID HORMONE: Lab: 39 pg/mL (ref 10–65)

## 2018-06-10 MED ORDER — TERIPARATIDE 20 MCG/DOSE - 600 MCG/2.4 ML SC PNIJ
20 ug | Freq: Every day | SUBCUTANEOUS | 3 refills | 28.00000 days | Status: AC
Start: 2018-06-10 — End: 2018-11-03
  Filled 2018-06-17 (×2): qty 3, 30d supply, fill #1

## 2018-06-10 MED ORDER — PEN NEEDLE, DIABETIC 32 GAUGE X 5/32" MISC NDLE
1 | 3 refills | 30.00000 days | Status: AC | PRN
Start: 2018-06-10 — End: ?

## 2018-06-10 MED ORDER — CHOLECALCIFEROL (VITAMIN D3) 2,000 UNIT PO CAP
2 | ORAL_CAPSULE | Freq: Every day | ORAL | 11 refills | 84.00000 days | Status: AC
Start: 2018-06-10 — End: ?

## 2018-06-10 MED ORDER — CALCIUM CARB AND CITRATE-VITD3 600 MG CALCIUM- 500 UNIT PO TBER
1 | ORAL_TABLET | Freq: Three times a day (TID) | ORAL | 11 refills | 33.00000 days | Status: AC
Start: 2018-06-10 — End: 2019-05-26

## 2018-06-11 ENCOUNTER — Encounter: Admit: 2018-06-11 | Discharge: 2018-06-11

## 2018-06-11 LAB — 25-OH VITAMIN D (D2 + D3): Lab: 25 ng/mL — ABNORMAL LOW (ref 30–80)

## 2018-06-12 ENCOUNTER — Encounter: Admit: 2018-06-12 | Discharge: 2018-06-12

## 2018-06-16 ENCOUNTER — Encounter: Admit: 2018-06-16 | Discharge: 2018-06-16

## 2018-06-17 ENCOUNTER — Encounter: Admit: 2018-06-17 | Discharge: 2018-06-17

## 2018-06-17 MED FILL — PEN NEEDLE, DIABETIC 31 GAUGE X 3/16" MISC NDLE: 31 gauge x 3/16" | 100 days supply | Qty: 300 | Fill #3 | Status: AC

## 2018-07-07 ENCOUNTER — Encounter: Admit: 2018-07-07 | Discharge: 2018-07-07

## 2018-07-11 ENCOUNTER — Encounter: Admit: 2018-07-11 | Discharge: 2018-07-11

## 2018-07-14 MED FILL — TERIPARATIDE 20 MCG/DOSE - 600 MCG/2.4 ML SC PNIJ: 20 mcg/dose - 600 mcg/2.4 mL | SUBCUTANEOUS | 30 days supply | Qty: 3 | Fill #2 | Status: AC

## 2018-07-15 ENCOUNTER — Encounter: Admit: 2018-07-15 | Discharge: 2018-07-15

## 2018-08-11 ENCOUNTER — Encounter: Admit: 2018-08-11 | Discharge: 2018-08-11

## 2018-08-11 DIAGNOSIS — Z01818 Encounter for other preprocedural examination: ICD-10-CM

## 2018-08-11 DIAGNOSIS — G959 Disease of spinal cord, unspecified: Principal | ICD-10-CM

## 2018-08-11 DIAGNOSIS — R799 Abnormal finding of blood chemistry, unspecified: ICD-10-CM

## 2018-08-11 DIAGNOSIS — R791 Abnormal coagulation profile: ICD-10-CM

## 2018-08-14 ENCOUNTER — Encounter: Admit: 2018-08-14 | Discharge: 2018-08-14

## 2018-08-14 MED FILL — TERIPARATIDE 20 MCG/DOSE - 600 MCG/2.4 ML SC PNIJ: 20 mcg/dose - 600 mcg/2.4 mL | SUBCUTANEOUS | 28 days supply | Qty: 3 | Fill #3 | Status: AC

## 2018-09-11 ENCOUNTER — Encounter: Admit: 2018-09-11 | Discharge: 2018-09-11

## 2018-09-12 ENCOUNTER — Encounter: Admit: 2018-09-12 | Discharge: 2018-09-12

## 2018-09-15 ENCOUNTER — Encounter: Admit: 2018-09-15 | Discharge: 2018-09-15

## 2018-09-15 MED FILL — TERIPARATIDE 20 MCG/DOSE - 600 MCG/2.4 ML SC PNIJ: 20 mcg/dose - 600 mcg/2.4 mL | SUBCUTANEOUS | 28 days supply | Qty: 3 | Fill #4 | Status: AC

## 2018-09-18 ENCOUNTER — Encounter: Admit: 2018-09-18 | Discharge: 2018-09-18

## 2018-09-30 ENCOUNTER — Encounter: Admit: 2018-09-30 | Discharge: 2018-09-30

## 2018-09-30 DIAGNOSIS — J189 Pneumonia, unspecified organism: Secondary | ICD-10-CM

## 2018-09-30 DIAGNOSIS — G959 Disease of spinal cord, unspecified: Secondary | ICD-10-CM

## 2018-09-30 DIAGNOSIS — K5900 Constipation, unspecified: Secondary | ICD-10-CM

## 2018-09-30 DIAGNOSIS — M5020 Other cervical disc displacement, unspecified cervical region: Secondary | ICD-10-CM

## 2018-09-30 DIAGNOSIS — E785 Hyperlipidemia, unspecified: Secondary | ICD-10-CM

## 2018-09-30 DIAGNOSIS — M4802 Spinal stenosis, cervical region: Secondary | ICD-10-CM

## 2018-09-30 DIAGNOSIS — Z8679 Personal history of other diseases of the circulatory system: Secondary | ICD-10-CM

## 2018-09-30 DIAGNOSIS — I10 Essential (primary) hypertension: Secondary | ICD-10-CM

## 2018-09-30 DIAGNOSIS — M797 Fibromyalgia: Secondary | ICD-10-CM

## 2018-09-30 DIAGNOSIS — G5600 Carpal tunnel syndrome, unspecified upper limb: Secondary | ICD-10-CM

## 2018-10-06 ENCOUNTER — Encounter: Admit: 2018-10-06 | Discharge: 2018-10-06

## 2018-10-06 ENCOUNTER — Ambulatory Visit: Admit: 2018-10-06 | Discharge: 2018-10-07 | Payer: MEDICARE

## 2018-10-06 DIAGNOSIS — Z86010 Personal history of colonic polyps: Secondary | ICD-10-CM

## 2018-10-06 DIAGNOSIS — G5600 Carpal tunnel syndrome, unspecified upper limb: Secondary | ICD-10-CM

## 2018-10-06 DIAGNOSIS — M5020 Other cervical disc displacement, unspecified cervical region: Secondary | ICD-10-CM

## 2018-10-06 DIAGNOSIS — Z8679 Personal history of other diseases of the circulatory system: Secondary | ICD-10-CM

## 2018-10-06 DIAGNOSIS — I10 Essential (primary) hypertension: Secondary | ICD-10-CM

## 2018-10-06 DIAGNOSIS — M797 Fibromyalgia: Secondary | ICD-10-CM

## 2018-10-06 DIAGNOSIS — G959 Disease of spinal cord, unspecified: Secondary | ICD-10-CM

## 2018-10-06 DIAGNOSIS — E785 Hyperlipidemia, unspecified: Secondary | ICD-10-CM

## 2018-10-06 DIAGNOSIS — Z8614 Personal history of Methicillin resistant Staphylococcus aureus infection: Secondary | ICD-10-CM

## 2018-10-06 DIAGNOSIS — K219 Gastro-esophageal reflux disease without esophagitis: Secondary | ICD-10-CM

## 2018-10-06 DIAGNOSIS — K5900 Constipation, unspecified: Secondary | ICD-10-CM

## 2018-10-06 DIAGNOSIS — M4802 Spinal stenosis, cervical region: Secondary | ICD-10-CM

## 2018-10-06 DIAGNOSIS — J189 Pneumonia, unspecified organism: Secondary | ICD-10-CM

## 2018-10-06 DIAGNOSIS — F419 Anxiety disorder, unspecified: Secondary | ICD-10-CM

## 2018-10-06 LAB — URINALYSIS DIPSTICK
Lab: NEGATIVE
Lab: NEGATIVE
Lab: 1 (ref 1.003–1.035)
Lab: NEGATIVE
Lab: NEGATIVE
Lab: NEGATIVE
Lab: NEGATIVE
Lab: NEGATIVE
Lab: NEGATIVE

## 2018-10-06 LAB — CBC AND DIFF
Lab: 0 10*3/uL (ref 0–0.20)
Lab: 0.2 10*3/uL (ref 0–0.45)
Lab: 0.4 10*3/uL (ref 0–0.80)
Lab: 1 % (ref 0–2)
Lab: 1.7 10*3/uL (ref 1.0–4.8)
Lab: 13 % (ref 11–15)
Lab: 14 g/dL (ref 12.0–15.0)
Lab: 242 10*3/uL (ref 150–400)
Lab: 29 % (ref 24–44)
Lab: 3.6 10*3/uL (ref 1.8–7.0)
Lab: 31 pg (ref 26–34)
Lab: 33 g/dL (ref 32.0–36.0)
Lab: 4 % (ref 0–5)
Lab: 4.5 M/UL (ref 4.0–5.0)
Lab: 42 % (ref 36–45)
Lab: 5.9 10*3/uL (ref 4.5–11.0)
Lab: 59 % (ref 41–77)
Lab: 7 % (ref 4–12)
Lab: 9.2 FL (ref 7–11)
Lab: 92 FL (ref 80–100)

## 2018-10-06 LAB — COMPREHENSIVE METABOLIC PANEL
Lab: 0.7 mg/dL (ref 0.4–1.00)
Lab: 10 mg/dL (ref 8.5–10.6)
Lab: 105 MMOL/L — ABNORMAL LOW (ref 98–110)
Lab: 142 MMOL/L — ABNORMAL LOW (ref 137–147)
Lab: 17 U/L (ref 7–40)
Lab: 29 MMOL/L (ref 21–30)
Lab: 4.3 g/dL (ref 3.5–5.0)
Lab: 4.4 MMOL/L (ref 3.5–5.1)
Lab: 7.2 g/dL — ABNORMAL LOW (ref 6.0–8.0)
Lab: 8 (ref 3–12)
Lab: 86 U/L (ref 25–110)
Lab: 92 mg/dL (ref 70–100)
Lab: >60 mL/min (ref >60)
Lab: >60 mL/min (ref >60)

## 2018-10-06 LAB — PTT (APTT): Lab: 29 s (ref 24.0–36.5)

## 2018-10-06 LAB — HEMOGLOBIN A1C: Lab: 5.8 % (ref 4.0–6.0)

## 2018-10-06 LAB — PROTIME INR (PT): Lab: 0.9 U/L (ref 0.8–1.2)

## 2018-10-06 LAB — PREALBUMIN: Lab: 36 mg/dL — ABNORMAL HIGH (ref 17–34)

## 2018-10-06 LAB — URINALYSIS, MICROSCOPIC

## 2018-10-07 DIAGNOSIS — R799 Abnormal finding of blood chemistry, unspecified: Secondary | ICD-10-CM

## 2018-10-07 DIAGNOSIS — G959 Disease of spinal cord, unspecified: Secondary | ICD-10-CM

## 2018-10-07 DIAGNOSIS — R791 Abnormal coagulation profile: Secondary | ICD-10-CM

## 2018-10-07 DIAGNOSIS — Z01818 Encounter for other preprocedural examination: Secondary | ICD-10-CM

## 2018-10-07 LAB — MRSA SCREEN

## 2018-10-08 ENCOUNTER — Ambulatory Visit: Admit: 2018-10-08 | Discharge: 2018-10-09 | Payer: MEDICARE

## 2018-10-08 ENCOUNTER — Encounter: Admit: 2018-10-08 | Discharge: 2018-10-08

## 2018-10-08 DIAGNOSIS — Z86010 Personal history of colonic polyps: Secondary | ICD-10-CM

## 2018-10-08 DIAGNOSIS — E785 Hyperlipidemia, unspecified: Secondary | ICD-10-CM

## 2018-10-08 DIAGNOSIS — Z8614 Personal history of Methicillin resistant Staphylococcus aureus infection: Secondary | ICD-10-CM

## 2018-10-08 DIAGNOSIS — M797 Fibromyalgia: Secondary | ICD-10-CM

## 2018-10-08 DIAGNOSIS — Z8679 Personal history of other diseases of the circulatory system: Secondary | ICD-10-CM

## 2018-10-08 DIAGNOSIS — G959 Disease of spinal cord, unspecified: Secondary | ICD-10-CM

## 2018-10-08 DIAGNOSIS — F419 Anxiety disorder, unspecified: Secondary | ICD-10-CM

## 2018-10-08 DIAGNOSIS — G5600 Carpal tunnel syndrome, unspecified upper limb: Secondary | ICD-10-CM

## 2018-10-08 DIAGNOSIS — M4802 Spinal stenosis, cervical region: Secondary | ICD-10-CM

## 2018-10-08 DIAGNOSIS — M5020 Other cervical disc displacement, unspecified cervical region: Secondary | ICD-10-CM

## 2018-10-08 DIAGNOSIS — I10 Essential (primary) hypertension: Secondary | ICD-10-CM

## 2018-10-08 DIAGNOSIS — K5900 Constipation, unspecified: Secondary | ICD-10-CM

## 2018-10-08 DIAGNOSIS — K219 Gastro-esophageal reflux disease without esophagitis: Secondary | ICD-10-CM

## 2018-10-08 DIAGNOSIS — J189 Pneumonia, unspecified organism: Secondary | ICD-10-CM

## 2018-10-09 DIAGNOSIS — G959 Disease of spinal cord, unspecified: Secondary | ICD-10-CM

## 2018-10-09 DIAGNOSIS — Z01818 Encounter for other preprocedural examination: Secondary | ICD-10-CM

## 2018-10-10 ENCOUNTER — Encounter: Admit: 2018-10-10 | Discharge: 2018-10-10

## 2018-10-10 MED FILL — TERIPARATIDE 20 MCG/DOSE - 600 MCG/2.4 ML SC PNIJ: 20 mcg/dose - 600 mcg/2.4 mL | SUBCUTANEOUS | 28 days supply | Qty: 3 | Fill #5 | Status: AC

## 2018-10-14 ENCOUNTER — Ambulatory Visit: Admit: 2018-10-14 | Discharge: 2018-10-14 | Payer: MEDICARE

## 2018-10-14 ENCOUNTER — Encounter: Admit: 2018-10-14 | Discharge: 2018-10-14

## 2018-10-14 DIAGNOSIS — M810 Age-related osteoporosis without current pathological fracture: Secondary | ICD-10-CM

## 2018-10-15 ENCOUNTER — Encounter: Admit: 2018-10-15 | Discharge: 2018-10-15

## 2018-10-16 ENCOUNTER — Encounter: Admit: 2018-10-16 | Discharge: 2018-10-16

## 2018-10-17 ENCOUNTER — Encounter: Admit: 2018-10-17 | Discharge: 2018-10-17

## 2018-10-24 ENCOUNTER — Encounter: Admit: 2018-10-24 | Discharge: 2018-10-24

## 2018-10-24 ENCOUNTER — Ambulatory Visit: Admit: 2018-10-24 | Discharge: 2018-10-25 | Payer: MEDICARE

## 2018-10-24 DIAGNOSIS — M5020 Other cervical disc displacement, unspecified cervical region: Secondary | ICD-10-CM

## 2018-10-24 DIAGNOSIS — K219 Gastro-esophageal reflux disease without esophagitis: Secondary | ICD-10-CM

## 2018-10-24 DIAGNOSIS — M797 Fibromyalgia: Secondary | ICD-10-CM

## 2018-10-24 DIAGNOSIS — Z8614 Personal history of Methicillin resistant Staphylococcus aureus infection: Secondary | ICD-10-CM

## 2018-10-24 DIAGNOSIS — G959 Disease of spinal cord, unspecified: Secondary | ICD-10-CM

## 2018-10-24 DIAGNOSIS — M4802 Spinal stenosis, cervical region: Secondary | ICD-10-CM

## 2018-10-24 DIAGNOSIS — J189 Pneumonia, unspecified organism: Secondary | ICD-10-CM

## 2018-10-24 DIAGNOSIS — Z86010 Personal history of colonic polyps: Secondary | ICD-10-CM

## 2018-10-24 DIAGNOSIS — F419 Anxiety disorder, unspecified: Secondary | ICD-10-CM

## 2018-10-24 DIAGNOSIS — G5600 Carpal tunnel syndrome, unspecified upper limb: Secondary | ICD-10-CM

## 2018-10-24 DIAGNOSIS — K5900 Constipation, unspecified: Secondary | ICD-10-CM

## 2018-10-24 DIAGNOSIS — E785 Hyperlipidemia, unspecified: Secondary | ICD-10-CM

## 2018-10-24 DIAGNOSIS — R0789 Other chest pain: Secondary | ICD-10-CM

## 2018-10-24 DIAGNOSIS — I10 Essential (primary) hypertension: Secondary | ICD-10-CM

## 2018-10-24 DIAGNOSIS — Z8679 Personal history of other diseases of the circulatory system: Secondary | ICD-10-CM

## 2018-10-25 DIAGNOSIS — I10 Essential (primary) hypertension: Secondary | ICD-10-CM

## 2018-10-25 DIAGNOSIS — I4510 Unspecified right bundle-branch block: Secondary | ICD-10-CM

## 2018-10-25 DIAGNOSIS — Z01810 Encounter for preprocedural cardiovascular examination: Secondary | ICD-10-CM

## 2018-10-25 DIAGNOSIS — Z720 Tobacco use: Secondary | ICD-10-CM

## 2018-10-25 DIAGNOSIS — E7800 Pure hypercholesterolemia, unspecified: Secondary | ICD-10-CM

## 2018-10-25 DIAGNOSIS — R079 Chest pain, unspecified: Secondary | ICD-10-CM

## 2018-10-25 DIAGNOSIS — E782 Mixed hyperlipidemia: Secondary | ICD-10-CM

## 2018-10-25 DIAGNOSIS — M4802 Spinal stenosis, cervical region: Secondary | ICD-10-CM

## 2018-10-25 DIAGNOSIS — Z952 Presence of prosthetic heart valve: Secondary | ICD-10-CM

## 2018-10-25 DIAGNOSIS — M5412 Radiculopathy, cervical region: Secondary | ICD-10-CM

## 2018-10-25 DIAGNOSIS — Z8249 Family history of ischemic heart disease and other diseases of the circulatory system: Secondary | ICD-10-CM

## 2018-10-27 ENCOUNTER — Encounter: Admit: 2018-10-27 | Discharge: 2018-10-27

## 2018-10-27 ENCOUNTER — Ambulatory Visit: Admit: 2018-10-27 | Discharge: 2018-10-27 | Payer: MEDICARE

## 2018-10-27 DIAGNOSIS — R079 Chest pain, unspecified: Principal | ICD-10-CM

## 2018-10-27 MED ORDER — ALBUTEROL SULFATE 90 MCG/ACTUATION IN HFAA
2 | RESPIRATORY_TRACT | 0 refills | Status: DC | PRN
Start: 2018-10-27 — End: 2018-11-01

## 2018-10-27 MED ORDER — AMINOPHYLLINE 500 MG/20 ML IV SOLN
50 mg | INTRAVENOUS | 0 refills | Status: AC | PRN
Start: 2018-10-27 — End: ?
  Administered 2018-10-27: 17:00:00 50 mg via INTRAVENOUS

## 2018-10-27 MED ORDER — SODIUM CHLORIDE 0.9 % IV SOLP
250 mL | INTRAVENOUS | 0 refills | Status: AC | PRN
Start: 2018-10-27 — End: ?

## 2018-10-27 MED ORDER — BENZOCAINE-MENTHOL 6-10 MG MM LOZG
1 | Freq: Once | ORAL | 0 refills | Status: AC | PRN
Start: 2018-10-27 — End: ?

## 2018-10-27 MED ORDER — REGADENOSON 0.4 MG/5 ML IV SYRG
.4 mg | Freq: Once | INTRAVENOUS | 0 refills | Status: CP
Start: 2018-10-27 — End: ?
  Administered 2018-10-27: 17:00:00 0.4 mg via INTRAVENOUS

## 2018-10-27 MED ORDER — NITROGLYCERIN 0.4 MG SL SUBL
.4 mg | SUBLINGUAL | 0 refills | Status: AC | PRN
Start: 2018-10-27 — End: ?

## 2018-10-27 NOTE — Telephone Encounter
Attempt to call patient regarding plan of care and surgery tomorrow. NO answer at this time

## 2018-10-28 ENCOUNTER — Encounter: Admit: 2018-10-28 | Discharge: 2018-10-28

## 2018-10-28 ENCOUNTER — Encounter: Admit: 2018-10-28 | Discharge: 2018-10-30 | Payer: MEDICARE

## 2018-10-28 DIAGNOSIS — G959 Disease of spinal cord, unspecified: ICD-10-CM

## 2018-10-28 DIAGNOSIS — M5020 Other cervical disc displacement, unspecified cervical region: ICD-10-CM

## 2018-10-28 DIAGNOSIS — J189 Pneumonia, unspecified organism: ICD-10-CM

## 2018-10-28 DIAGNOSIS — F419 Anxiety disorder, unspecified: ICD-10-CM

## 2018-10-28 DIAGNOSIS — I10 Essential (primary) hypertension: ICD-10-CM

## 2018-10-28 DIAGNOSIS — E785 Hyperlipidemia, unspecified: ICD-10-CM

## 2018-10-28 DIAGNOSIS — M4802 Spinal stenosis, cervical region: ICD-10-CM

## 2018-10-28 DIAGNOSIS — K5900 Constipation, unspecified: ICD-10-CM

## 2018-10-28 DIAGNOSIS — M797 Fibromyalgia: ICD-10-CM

## 2018-10-28 DIAGNOSIS — G5600 Carpal tunnel syndrome, unspecified upper limb: ICD-10-CM

## 2018-10-28 DIAGNOSIS — Z8614 Personal history of Methicillin resistant Staphylococcus aureus infection: ICD-10-CM

## 2018-10-28 DIAGNOSIS — Z8679 Personal history of other diseases of the circulatory system: ICD-10-CM

## 2018-10-28 DIAGNOSIS — K219 Gastro-esophageal reflux disease without esophagitis: ICD-10-CM

## 2018-10-28 DIAGNOSIS — Z86010 Personal history of colonic polyps: ICD-10-CM

## 2018-10-28 MED ORDER — DEXAMETHASONE SODIUM PHOSPHATE 4 MG/ML IJ SOLN
INTRAVENOUS | 0 refills | Status: DC
Start: 2018-10-28 — End: 2018-10-28
  Administered 2018-10-28: 21:00:00 10 mg via INTRAVENOUS

## 2018-10-28 MED ORDER — ARIPIPRAZOLE 10 MG PO TAB
10 mg | Freq: Every day | ORAL | 0 refills | Status: DC
Start: 2018-10-28 — End: 2018-10-30
  Administered 2018-10-29 – 2018-10-30 (×2): 10 mg via ORAL

## 2018-10-28 MED ORDER — ACETAMINOPHEN 500 MG PO TAB
500-1000 mg | ORAL | 0 refills | Status: DC
Start: 2018-10-28 — End: 2018-10-29

## 2018-10-28 MED ORDER — SUCCINYLCHOLINE CHLORIDE 20 MG/ML IJ SOLN
INTRAVENOUS | 0 refills | Status: DC
Start: 2018-10-28 — End: 2018-10-28
  Administered 2018-10-28: 21:00:00 100 mg via INTRAVENOUS

## 2018-10-28 MED ORDER — PROPOFOL 10 MG/ML IV EMUL (INFUSION)(AM)(OR)
0 refills | Status: DC
Start: 2018-10-28 — End: 2018-10-28
  Administered 2018-10-28: 21:00:00 150 ug/kg/min via INTRAVENOUS

## 2018-10-28 MED ORDER — CEFAZOLIN INJ 1GM IVP
2 g | INTRAVENOUS | 0 refills | Status: CP
Start: 2018-10-28 — End: ?
  Administered 2018-10-29 (×2): 2 g via INTRAVENOUS

## 2018-10-28 MED ORDER — ELECTROLYTE-A IV SOLP
0 refills | Status: DC
Start: 2018-10-28 — End: 2018-10-28
  Administered 2018-10-28: 21:00:00 via INTRAVENOUS

## 2018-10-28 MED ORDER — ACETAMINOPHEN 500 MG PO TAB
1000 mg | Freq: Once | ORAL | 0 refills | Status: CP
Start: 2018-10-28 — End: ?
  Administered 2018-10-28: 20:00:00 1000 mg via ORAL

## 2018-10-28 MED ORDER — OXYCODONE 5 MG PO TAB
5-15 mg | ORAL | 0 refills | Status: DC | PRN
Start: 2018-10-28 — End: 2018-10-29
  Administered 2018-10-29 (×2): 10 mg via ORAL

## 2018-10-28 MED ORDER — CYCLOBENZAPRINE 10 MG PO TAB
5 mg | ORAL | 0 refills | Status: DC | PRN
Start: 2018-10-28 — End: 2018-10-29

## 2018-10-28 MED ORDER — LACTATED RINGERS IV SOLP
1000 mL | INTRAVENOUS | 0 refills | Status: DC
Start: 2018-10-28 — End: 2018-10-29
  Administered 2018-10-28: 19:00:00 1000 mL via INTRAVENOUS

## 2018-10-28 MED ORDER — HALOPERIDOL LACTATE 5 MG/ML IJ SOLN
1 mg | Freq: Once | INTRAVENOUS | 0 refills | Status: DC | PRN
Start: 2018-10-28 — End: 2018-10-29

## 2018-10-28 MED ORDER — PHENYLEPHRINE IN 0.9% NACL(PF) 1 MG/10 ML (100 MCG/ML) IV SYRG
0 refills | Status: DC
Start: 2018-10-28 — End: 2018-10-28
  Administered 2018-10-28 (×3): 100 ug via INTRAVENOUS

## 2018-10-28 MED ORDER — LIDOCAINE (PF) 200 MG/10 ML (2 %) IJ SYRG
0 refills | Status: DC
Start: 2018-10-28 — End: 2018-10-28
  Administered 2018-10-28: 21:00:00 100 mg via INTRAVENOUS

## 2018-10-28 MED ORDER — REMIFENTANYL 1000MCG IN NS 20ML (OR)
0 refills | Status: DC
Start: 2018-10-28 — End: 2018-10-28
  Administered 2018-10-28 (×2): .1 ug/kg/min via INTRAVENOUS

## 2018-10-28 MED ORDER — DULOXETINE 60 MG PO CPDR
60 mg | Freq: Every day | ORAL | 0 refills | Status: DC
Start: 2018-10-28 — End: 2018-10-29

## 2018-10-28 MED ORDER — LABETALOL 5 MG/ML IV SYRG
0 refills | Status: DC
Start: 2018-10-28 — End: 2018-10-28
  Administered 2018-10-28: 23:00:00 10 mg via INTRAVENOUS

## 2018-10-28 MED ORDER — KETAMINE 10 MG/ML IJ SOLN (INFUSION)(AM)(OR)
0 refills | Status: DC
Start: 2018-10-28 — End: 2018-10-28
  Administered 2018-10-28: 21:00:00 200 ug/kg/h via INTRAVENOUS

## 2018-10-28 MED ORDER — KETOROLAC 15 MG/ML IJ SOLN
15 mg | INTRAVENOUS | 0 refills | Status: DC
Start: 2018-10-28 — End: 2018-10-30
  Administered 2018-10-29 – 2018-10-30 (×6): 15 mg via INTRAVENOUS

## 2018-10-28 MED ORDER — PROMETHAZINE 25 MG/ML IJ SOLN
6.25 mg | INTRAVENOUS | 0 refills | Status: DC | PRN
Start: 2018-10-28 — End: 2018-10-29

## 2018-10-28 MED ORDER — DIPHENHYDRAMINE HCL 25 MG PO CAP
25 mg | ORAL | 0 refills | Status: DC | PRN
Start: 2018-10-28 — End: 2018-10-30

## 2018-10-28 MED ORDER — PROPOFOL INJ 10 MG/ML IV VIAL
0 refills | Status: DC
Start: 2018-10-28 — End: 2018-10-28
  Administered 2018-10-28: 21:00:00 170 mg via INTRAVENOUS
  Administered 2018-10-28: 21:00:00 30 mg via INTRAVENOUS
  Administered 2018-10-28 (×2): 100 mg via INTRAVENOUS

## 2018-10-28 MED ORDER — IMS MIXTURE TEMPLATE
200 mg | Freq: Two times a day (BID) | ORAL | 0 refills | Status: DC
Start: 2018-10-28 — End: 2018-10-30
  Administered 2018-10-29 – 2018-10-30 (×8): 200 mg via ORAL

## 2018-10-28 MED ORDER — BENZOCAINE-MENTHOL 6-10 MG MM LOZG
1 | ORAL | 0 refills | Status: DC | PRN
Start: 2018-10-28 — End: 2018-10-30

## 2018-10-28 MED ORDER — ONDANSETRON HCL (PF) 4 MG/2 ML IJ SOLN
4 mg | INTRAVENOUS | 0 refills | Status: DC | PRN
Start: 2018-10-28 — End: 2018-10-30

## 2018-10-28 MED ORDER — DULOXETINE 30 MG PO CPDR
90 mg | Freq: Every day | ORAL | 0 refills | Status: DC
Start: 2018-10-28 — End: 2018-10-30
  Administered 2018-10-29 – 2018-10-30 (×2): 90 mg via ORAL

## 2018-10-28 MED ORDER — ACETAMINOPHEN 500 MG PO TAB
1000 mg | ORAL | 0 refills | Status: DC
Start: 2018-10-28 — End: 2018-10-29
  Administered 2018-10-29 (×2): 1000 mg via ORAL

## 2018-10-28 MED ORDER — BISACODYL 10 MG RE SUPP
10 mg | Freq: Every day | RECTAL | 0 refills | Status: DC | PRN
Start: 2018-10-28 — End: 2018-10-30

## 2018-10-28 MED ORDER — PHENYLEPHRINE IV DRIP (DBL CONC)
0 refills | Status: DC
Start: 2018-10-28 — End: 2018-10-28
  Administered 2018-10-28 (×2): .4 ug/kg/min via INTRAVENOUS

## 2018-10-28 MED ORDER — SODIUM CHLORIDE 0.9 % IV SOLP
INTRAVENOUS | 0 refills | Status: DC
Start: 2018-10-28 — End: 2018-10-30
  Administered 2018-10-28 – 2018-10-29 (×2): 1000.000 mL via INTRAVENOUS
  Administered 2018-10-29: 13:00:00 1000 mL via INTRAVENOUS

## 2018-10-28 MED ORDER — OXYCODONE SR 10 MG PO 12HR TABLET
10 mg | Freq: Once | ORAL | 0 refills | Status: CP
Start: 2018-10-28 — End: ?
  Administered 2018-10-28: 20:00:00 10 mg via ORAL

## 2018-10-28 MED ORDER — CEFAZOLIN 1 GRAM IJ SOLR
0 refills | Status: DC
Start: 2018-10-28 — End: 2018-10-28
  Administered 2018-10-28: 21:00:00 2 g via INTRAVENOUS

## 2018-10-28 MED ORDER — FAMOTIDINE 20 MG PO TAB
20 mg | Freq: Two times a day (BID) | ORAL | 0 refills | Status: DC
Start: 2018-10-28 — End: 2018-10-30
  Administered 2018-10-29 – 2018-10-30 (×4): 20 mg via ORAL

## 2018-10-28 MED ORDER — CLONAZEPAM 0.5 MG PO TAB
.5 mg | Freq: Every day | ORAL | 0 refills | Status: DC | PRN
Start: 2018-10-28 — End: 2018-10-30

## 2018-10-28 MED ORDER — TRAZODONE 100 MG PO TAB
100 mg | Freq: Every evening | ORAL | 0 refills | Status: DC | PRN
Start: 2018-10-28 — End: 2018-10-30

## 2018-10-28 MED ORDER — DOCUSATE SODIUM 100 MG PO CAP
100 mg | Freq: Two times a day (BID) | ORAL | 0 refills | Status: DC
Start: 2018-10-28 — End: 2018-10-30
  Administered 2018-10-29 – 2018-10-30 (×4): 100 mg via ORAL

## 2018-10-28 MED ORDER — MAGNESIUM HYDROXIDE 2,400 MG/10 ML PO SUSP
10 mL | Freq: Every day | ORAL | 0 refills | Status: DC
Start: 2018-10-28 — End: 2018-10-30
  Administered 2018-10-29 – 2018-10-30 (×2): 10 mL via ORAL

## 2018-10-28 MED ORDER — OXYCODONE 5 MG PO TAB
5-10 mg | Freq: Once | ORAL | 0 refills | Status: CP | PRN
Start: 2018-10-28 — End: ?
  Administered 2018-10-29: 5 mg via ORAL

## 2018-10-28 MED ORDER — SIMVASTATIN 40 MG PO TAB
40 mg | Freq: Every evening | ORAL | 0 refills | Status: DC
Start: 2018-10-28 — End: 2018-10-30
  Administered 2018-10-29 – 2018-10-30 (×2): 40 mg via ORAL

## 2018-10-28 MED ORDER — ALBUTEROL SULFATE 90 MCG/ACTUATION IN HFAA
2 | RESPIRATORY_TRACT | 0 refills | Status: DC | PRN
Start: 2018-10-28 — End: 2018-10-30
  Administered 2018-10-29: 15:00:00 2 via RESPIRATORY_TRACT

## 2018-10-28 MED ORDER — HYDROMORPHONE (PF) 2 MG/ML IJ SYRG
.5-1 mg | INTRAVENOUS | 0 refills | Status: DC | PRN
Start: 2018-10-28 — End: 2018-10-29

## 2018-10-28 MED ORDER — GABAPENTIN 100 MG PO CAP
100 mg | ORAL | 0 refills | Status: DC
Start: 2018-10-28 — End: 2018-10-29
  Administered 2018-10-29: 100 mg via ORAL

## 2018-10-28 MED ORDER — MELATONIN 3 MG PO TAB
3 mg | Freq: Every evening | ORAL | 0 refills | Status: DC
Start: 2018-10-28 — End: 2018-10-30
  Administered 2018-10-29 – 2018-10-30 (×2): 3 mg via ORAL

## 2018-10-28 MED ORDER — THROMBIN (BOVINE) 5,000 UNIT TP SOLR
0 refills | Status: DC
Start: 2018-10-28 — End: 2018-10-28
  Administered 2018-10-28: 21:00:00 5000 [IU] via TOPICAL

## 2018-10-28 MED ORDER — HYDROMORPHONE (PF) 2 MG/ML IJ SYRG
.5-1 mg | INTRAVENOUS | 0 refills | Status: DC | PRN
Start: 2018-10-28 — End: 2018-10-30

## 2018-10-28 MED ORDER — LACTATED RINGERS IV SOLP
INTRAVENOUS | 0 refills | Status: DC
Start: 2018-10-28 — End: 2018-10-29

## 2018-10-28 MED ORDER — DIPHENHYDRAMINE HCL 50 MG/ML IJ SOLN
25 mg | Freq: Once | INTRAVENOUS | 0 refills | Status: DC | PRN
Start: 2018-10-28 — End: 2018-10-29

## 2018-10-28 MED ORDER — DEXAMETHASONE SODIUM PHOS (PF) 10 MG/ML IJ SOLN
4 mg | INTRAVENOUS | 0 refills | Status: AC
Start: 2018-10-28 — End: ?
  Administered 2018-10-29 (×2): 4 mg via INTRAVENOUS

## 2018-10-28 MED ORDER — FENTANYL CITRATE (PF) 50 MCG/ML IJ SOLN
25-50 ug | INTRAVENOUS | 0 refills | Status: DC | PRN
Start: 2018-10-28 — End: 2018-10-29
  Administered 2018-10-28: 23:00:00 50 ug via INTRAVENOUS

## 2018-10-28 MED ORDER — FENTANYL CITRATE (PF) 50 MCG/ML IJ SOLN
0 refills | Status: DC
Start: 2018-10-28 — End: 2018-10-28
  Administered 2018-10-28: 20:00:00 100 ug via INTRAVENOUS

## 2018-10-28 MED ORDER — DIPHENHYDRAMINE HCL 50 MG/ML IJ SOLN
25 mg | INTRAVENOUS | 0 refills | Status: DC | PRN
Start: 2018-10-28 — End: 2018-10-30

## 2018-10-28 MED ORDER — OXYCODONE 5 MG PO TAB
5-15 mg | ORAL | 0 refills | Status: DC | PRN
Start: 2018-10-28 — End: 2018-10-29

## 2018-10-28 MED ORDER — BACLOFEN 10 MG PO TAB
10 mg | Freq: Two times a day (BID) | ORAL | 0 refills | Status: DC
Start: 2018-10-28 — End: 2018-10-30
  Administered 2018-10-29 – 2018-10-30 (×4): 10 mg via ORAL

## 2018-10-28 MED ORDER — POLYETHYLENE GLYCOL 3350 17 GRAM PO PWPK
1 | Freq: Two times a day (BID) | ORAL | 0 refills | Status: DC
Start: 2018-10-28 — End: 2018-10-30
  Administered 2018-10-29 – 2018-10-30 (×4): 17 g via ORAL

## 2018-10-28 MED ORDER — KETAMINE 10 MG/ML IJ SOLN
0 refills | Status: DC
Start: 2018-10-28 — End: 2018-10-28
  Administered 2018-10-28: 21:00:00 40 mg via INTRAVENOUS

## 2018-10-29 ENCOUNTER — Encounter: Admit: 2018-10-29 | Discharge: 2018-10-29

## 2018-10-29 DIAGNOSIS — G959 Disease of spinal cord, unspecified: Principal | ICD-10-CM

## 2018-10-29 LAB — BASIC METABOLIC PANEL
Lab: 104 MMOL/L — ABNORMAL HIGH (ref 98–110)
Lab: 145 mg/dL — ABNORMAL HIGH (ref >60)

## 2018-10-29 MED ORDER — PNEUMOCOCCAL 23-VAL PS VACCINE 25 MCG/0.5 ML IJ SOLN
.5 mL | Freq: Once | INTRAMUSCULAR | 0 refills | Status: CP
Start: 2018-10-29 — End: ?
  Administered 2018-10-30: 14:00:00 0.5 mL via INTRAMUSCULAR

## 2018-10-29 MED ORDER — DOCUSATE SODIUM 100 MG PO CAP
100 mg | ORAL_CAPSULE | Freq: Two times a day (BID) | ORAL | 1 refills | Status: AC
Start: 2018-10-29 — End: 2019-03-04

## 2018-10-29 MED ORDER — OXYCODONE 5 MG PO TAB
5-15 mg | ORAL_TABLET | ORAL | 0 refills | 6.00000 days | Status: AC | PRN
Start: 2018-10-29 — End: 2018-10-30

## 2018-10-29 MED ORDER — ACETAMINOPHEN 500 MG PO TAB
1000 mg | ORAL_TABLET | ORAL | 1 refills | Status: AC | PRN
Start: 2018-10-29 — End: ?

## 2018-10-29 MED ORDER — OXYCODONE 5 MG PO TAB
5 mg | ORAL_TABLET | ORAL | 0 refills | 6.00000 days | Status: AC | PRN
Start: 2018-10-29 — End: 2018-10-30
  Filled 2018-10-29: qty 90, 7d supply, fill #1

## 2018-10-29 MED ORDER — HYDROCODONE-ACETAMINOPHEN 10-325 MG PO TAB
1-2 | ORAL | 0 refills | Status: DC | PRN
Start: 2018-10-29 — End: 2018-10-30
  Administered 2018-10-29: 23:00:00 1 via ORAL
  Administered 2018-10-30: 14:00:00 2 via ORAL
  Administered 2018-10-30 (×2): 1 via ORAL

## 2018-10-29 MED ORDER — OXYCODONE 5 MG PO TAB
5-10 mg | ORAL_TABLET | ORAL | 0 refills | 6.00000 days | Status: AC | PRN
Start: 2018-10-29 — End: 2018-10-30
  Filled 2018-10-29: qty 90, 7d supply, fill #1

## 2018-10-30 ENCOUNTER — Encounter: Admit: 2018-10-28 | Discharge: 2018-10-28

## 2018-10-30 ENCOUNTER — Encounter: Admit: 2018-10-30 | Discharge: 2018-10-30

## 2018-10-30 ENCOUNTER — Encounter: Admit: 2018-10-29 | Discharge: 2018-10-29

## 2018-10-30 ENCOUNTER — Encounter: Admit: 2018-10-06 | Discharge: 2018-10-06

## 2018-10-30 DIAGNOSIS — F419 Anxiety disorder, unspecified: ICD-10-CM

## 2018-10-30 DIAGNOSIS — Z7951 Long term (current) use of inhaled steroids: Secondary | ICD-10-CM

## 2018-10-30 DIAGNOSIS — Z791 Long term (current) use of non-steroidal anti-inflammatories (NSAID): ICD-10-CM

## 2018-10-30 DIAGNOSIS — E785 Hyperlipidemia, unspecified: Secondary | ICD-10-CM

## 2018-10-30 DIAGNOSIS — M4802 Spinal stenosis, cervical region: ICD-10-CM

## 2018-10-30 DIAGNOSIS — M5010 Cervical disc disorder with radiculopathy, unspecified cervical region: ICD-10-CM

## 2018-10-30 DIAGNOSIS — M50023 Cervical disc disorder at C6-C7 level with myelopathy: ICD-10-CM

## 2018-10-30 DIAGNOSIS — M2578 Osteophyte, vertebrae: ICD-10-CM

## 2018-10-30 DIAGNOSIS — Z7982 Long term (current) use of aspirin: ICD-10-CM

## 2018-10-30 DIAGNOSIS — G959 Disease of spinal cord, unspecified: Principal | ICD-10-CM

## 2018-10-30 DIAGNOSIS — M797 Fibromyalgia: Secondary | ICD-10-CM

## 2018-10-30 DIAGNOSIS — Z8614 Personal history of Methicillin resistant Staphylococcus aureus infection: Secondary | ICD-10-CM

## 2018-10-30 DIAGNOSIS — M5020 Other cervical disc displacement, unspecified cervical region: ICD-10-CM

## 2018-10-30 DIAGNOSIS — Z01818 Encounter for other preprocedural examination: ICD-10-CM

## 2018-10-30 DIAGNOSIS — K219 Gastro-esophageal reflux disease without esophagitis: ICD-10-CM

## 2018-10-30 DIAGNOSIS — F329 Major depressive disorder, single episode, unspecified: ICD-10-CM

## 2018-10-30 DIAGNOSIS — Z794 Long term (current) use of insulin: ICD-10-CM

## 2018-10-30 DIAGNOSIS — Z23 Encounter for immunization: ICD-10-CM

## 2018-10-30 DIAGNOSIS — Z79899 Other long term (current) drug therapy: Secondary | ICD-10-CM

## 2018-10-30 DIAGNOSIS — Z86010 Personal history of colonic polyps: ICD-10-CM

## 2018-10-30 DIAGNOSIS — Z8679 Personal history of other diseases of the circulatory system: ICD-10-CM

## 2018-10-30 DIAGNOSIS — Z881 Allergy status to other antibiotic agents status: Secondary | ICD-10-CM

## 2018-10-30 DIAGNOSIS — I10 Essential (primary) hypertension: ICD-10-CM

## 2018-10-30 DIAGNOSIS — Z87891 Personal history of nicotine dependence: ICD-10-CM

## 2018-10-30 DIAGNOSIS — J189 Pneumonia, unspecified organism: ICD-10-CM

## 2018-10-30 DIAGNOSIS — G5600 Carpal tunnel syndrome, unspecified upper limb: ICD-10-CM

## 2018-10-30 DIAGNOSIS — K5900 Constipation, unspecified: ICD-10-CM

## 2018-10-30 LAB — BASIC METABOLIC PANEL
Lab: 4.6 MMOL/L — ABNORMAL HIGH (ref <1.0)
Lab: 8.8 mg/dL — ABNORMAL HIGH (ref >60)

## 2018-10-30 LAB — CBC: Lab: 3.9 M/UL — ABNORMAL LOW (ref 4.0–5.0)

## 2018-10-30 MED ORDER — HYDROCODONE-ACETAMINOPHEN 10-325 MG PO TAB
1-2 | ORAL_TABLET | ORAL | 0 refills | 30.00000 days | Status: AC | PRN
Start: 2018-10-30 — End: 2019-03-04
  Filled 2018-10-30: qty 80, 14d supply, fill #1

## 2018-10-30 MED ORDER — HYDROCODONE-ACETAMINOPHEN 10-325 MG PO TAB
1 | ORAL_TABLET | ORAL | 0 refills | 15.00000 days | Status: AC | PRN
Start: 2018-10-30 — End: 2018-12-03
  Filled 2018-10-30: qty 80, 14d supply, fill #1

## 2018-10-30 MED ORDER — HYDROCODONE-ACETAMINOPHEN 10-325 MG PO TAB
1-2 | ORAL_TABLET | ORAL | 0 refills | 15.00000 days | Status: AC | PRN
Start: 2018-10-30 — End: 2019-03-04

## 2018-10-31 ENCOUNTER — Encounter: Admit: 2018-10-31 | Discharge: 2018-10-31

## 2018-11-03 ENCOUNTER — Encounter: Admit: 2018-11-03 | Discharge: 2018-11-03

## 2018-11-03 MED ORDER — TERIPARATIDE 20 MCG/DOSE - 600 MCG/2.4 ML SC PNIJ
20 ug | Freq: Every day | SUBCUTANEOUS | 3 refills | 28.00000 days | Status: AC
Start: 2018-11-03 — End: 2019-03-05
  Filled 2018-11-10 (×2): qty 2, 30d supply, fill #1

## 2018-11-10 ENCOUNTER — Encounter: Admit: 2018-11-10 | Discharge: 2018-11-10

## 2018-11-17 ENCOUNTER — Encounter: Admit: 2018-11-17 | Discharge: 2018-11-17

## 2018-11-17 NOTE — Telephone Encounter
Attempted to call Kristen Vaughn to verify compliance and assess tolerance of her specialty medications (Forteo). No answer. Left voicemail asking patient to return call to pharmacist at [phone].    Ladona Mow, PHARMD

## 2018-11-19 ENCOUNTER — Encounter: Admit: 2018-11-19 | Discharge: 2018-11-19

## 2018-11-19 NOTE — Telephone Encounter
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    Kristen Vaughn has been on therapy for 52 weeks. The regimen of 20 mcg subcutaneously daily is planned to continue 52 weeks with a maximum total lifetime duration of 2 years which is appropriate for Kristen Vaughn. No renal or hepatic adjustments are required.     At this time the there is no planned dose titration.    Response to Therapy  Patient assessments:   Subjective clinical assessment: on a scale of 1 to 10, the patient rates they are feeling 10 out of 10 while on treatment.   Subjective Quality of Life Measurement: Kristen Vaughn was unable to participate in social activities over the past 365 days.  Cites pain from walking due to back pain, either from arthritis or osteoporosis.    Overall fall risk assessment: na  DEXA T-scores / bone mineral density changes: 10/14/18  LUMBAR SPINE, L1-L4  Current: 0.866 g/cm2, T-score of -2.7       Previous: 0.755 g/cm2, T-score of -3.5  ???  LEFT FEMORAL NECK    Current: 0.799 g/cm2, T-score of -1.7       Previous: 0.768 g/cm2, T-score of -1.9  ???  LEFT TOTAL HIP    Current: 0.860 g/cm2, T-score of -1.2   Previous: 0.846 g/cm2, T-score of -1.3  ???  RIGHT FEMORAL NECK  Current: 0.690 g/cm2, T-score of -2.5       Previous: 0.782 g/cm2, T-score of -1.8  ???  RIGHT TOTAL HIP  Current: 0.862 g/cm2, T-score of -1.2   Previous: 0.836 g/cm2, T-score of 1.4    Fracture Risk Assessment: na  Lab Results   Component Value Date    VITD25 25.5 (L) 06/10/2018    CA 8.8 10/30/2018     Ht Readings from Last 3 Encounters:   10/28/18 172.7 cm (67.99)   10/24/18 170.2 cm (67)   10/08/18 170.5 cm (67.13)     Wt Readings from Last 3 Encounters:   10/28/18 106.6 kg (235 lb 0.2 oz)   10/24/18 105.6 kg (232 lb 11.2 oz)   10/08/18 105.7 kg (233 lb) As the patient is achieving therapeutic benefit from the therapy the plan is to continue.     Adverse Effects  MYCHAEL BIERNAT is not experiencing any significant adverse effects to this medication regimen.    Adherence  Refill and adherence history were reviewed with the patient. The patient is adherent with refills and is meeting their refill goal. They report no missed doses over the past 30 days.  The patient is meeting their adherence goal. The patient was reminded about the refill process and re-educated on the importance of adherence.    Allergies   Allergies   Allergen Reactions   ??? Ascorbic Acid ANAPHYLAXIS   ??? Erythromycin RASH     Rash on trunk   ??? Strawberry UNKNOWN        Vaccination Status Assessment   Immunization History   Administered Date(s) Administered   ??? Pneumococcal Vaccine (23-Val Adult) 10/30/2018     Vaccine history was reviewed with the patient. The patient was reminded about the importance of receiving an annual influenza vaccine as indicated.     Medication Reconciliation  A medication history and reconciliation were performed (including prescription medications, supplements, over the counter, and herbal products). The medication list was updated and the  patients??? current medication list is included below.     Home Medications    Medication Sig   acetaminophen (TYLENOL) 500 mg tablet Take two tablets by mouth every 6 hours as needed for Pain. Max of 4,000 mg of acetaminophen in 24 hours.   albuterol (PROAIR HFA) 90 mcg/actuation inhaler Inhale 2 Puffs by mouth into the lungs every 6 hours as needed for Wheezing or Shortness of Breath. Shake well before use.   ARIPiprazole (ABILIFY) 10 mg tablet Take 10 mg by mouth daily.   aspirin 325 mg tablet Take 325 mg by mouth daily as needed for Pain. Take with food.   baclofen (LIORESAL) 10 mg tablet Take 10 mg by mouth twice daily.   calcium carb and citrate-vit D3 600 mg calcium- 500 unit ER tablet Take 1 tablet by mouth three times daily. cetirizine (ZYRTEC) 10 mg tablet Take 10 mg by mouth daily as needed for Allergy symptoms.   Cholecalciferol (Vitamin D3) (VITAMIN D-3) 2,000 unit cap Take two capsules by mouth daily.   clonazePAM (KLONOPIN) 0.5 mg tablet Take 0.5 mg by mouth daily as needed.   diclofenac (VOLTAREN) 1 % topical gel Apply 4 g topically to affected area four times daily.   docusate (COLACE) 100 mg capsule Take one capsule by mouth twice daily.   duloxetine DR (CYMBALTA) 30 mg capsule Take 30 mg by mouth daily.   duloxetine DR (CYMBALTA) 60 mg capsule Take 60 mg by mouth daily.   HYDROcodone/acetaminophen (NORCO) 10/325 mg tablet Take one tablet to two tablets by mouth every 4 hours as needed   HYDROcodone/acetaminophen (NORCO) 10/325 mg tablet Take one tablet to two tablets by mouth every 6 hours as needed for Pain   HYDROcodone/acetaminophen (NORCO) 10/325 mg tablet Take one tablet by mouth every 6 hours as needed for Pain   insulin pen needles (disposable) (BD UF NANO PEN NEEDLES) 32 gauge x 5/32 pen needle Use one each as directed as Needed. Use with insulin injections.   insulin pen needles (disposable) (BD ULTRA-FINE MINI PEN NEEDLE) 31 gauge x 3/16 pen needle Use 1 each as directed as Needed. Use with Forteo pen.   lisinopril (PRINIVIL; ZESTRIL) 10 mg tablet Take 10 mg by mouth daily.   melatonin 3 mg tab Take 3 mg by mouth at bedtime daily.   omeprazole DR(+) (PRILOSEC) 40 mg capsule Take 40 mg by mouth daily before breakfast.   pregabalin (LYRICA) 200 mg capsule Take one capsule by mouth twice daily.   simvastatin (ZOCOR) 20 mg tablet Take 40 mg by mouth at bedtime daily.   teriparatide (FORTEO) 20 mcg/dose - 600 mcg/2.4 mL pnij injection pen Inject 20 mcg under the skin daily.   teriparatide(+) (FORTEO) 20 mcg/dose - 600 mcg/2.4 mL pnij injection pen Inject twenty mcg under the skin daily.   traZODone (DESYREL) 100 mg tablet Take 100 mg by mouth at bedtime as needed.

## 2018-11-20 ENCOUNTER — Encounter: Admit: 2018-11-20 | Discharge: 2018-11-20

## 2018-11-20 NOTE — Telephone Encounter
Patient called to request injections for her low back. Attempt to call patient back not answer at this time, VM left

## 2018-11-20 NOTE — Progress Notes
The Prior Authorization for Kristen Vaughn was approved for Kristen Vaughn from 11/20/2018 to 09/24/2019.  The copay is $0.    Kristen Vaughn has stated this copay is affordable.  The specialty pharmacy will pursue additional copay assistance as necessary.  The specialty pharmacy will reach out to the provider's nurse if the copay becomes unaffordable.    The medication will be delivered to patient's prescription address per the patient's request.    Shelva Majestic  Pharmacy Patient Advocate  [phone]

## 2018-12-01 ENCOUNTER — Encounter: Admit: 2018-12-01 | Discharge: 2018-12-01

## 2018-12-01 DIAGNOSIS — M5412 Radiculopathy, cervical region: ICD-10-CM

## 2018-12-01 DIAGNOSIS — M810 Age-related osteoporosis without current pathological fracture: ICD-10-CM

## 2018-12-01 DIAGNOSIS — Z01818 Encounter for other preprocedural examination: ICD-10-CM

## 2018-12-01 DIAGNOSIS — M4802 Spinal stenosis, cervical region: ICD-10-CM

## 2018-12-01 DIAGNOSIS — G959 Disease of spinal cord, unspecified: Principal | ICD-10-CM

## 2018-12-03 ENCOUNTER — Ambulatory Visit: Admit: 2018-12-03 | Discharge: 2018-12-03

## 2018-12-03 ENCOUNTER — Encounter: Admit: 2018-12-03 | Discharge: 2018-12-03

## 2018-12-03 ENCOUNTER — Ambulatory Visit: Admit: 2018-12-03 | Discharge: 2018-12-03 | Payer: MEDICARE

## 2018-12-03 DIAGNOSIS — K219 Gastro-esophageal reflux disease without esophagitis: ICD-10-CM

## 2018-12-03 DIAGNOSIS — I10 Essential (primary) hypertension: ICD-10-CM

## 2018-12-03 DIAGNOSIS — Z981 Arthrodesis status: ICD-10-CM

## 2018-12-03 DIAGNOSIS — G959 Disease of spinal cord, unspecified: ICD-10-CM

## 2018-12-03 DIAGNOSIS — E785 Hyperlipidemia, unspecified: ICD-10-CM

## 2018-12-03 DIAGNOSIS — M810 Age-related osteoporosis without current pathological fracture: ICD-10-CM

## 2018-12-03 DIAGNOSIS — M4802 Spinal stenosis, cervical region: ICD-10-CM

## 2018-12-03 DIAGNOSIS — M5412 Radiculopathy, cervical region: ICD-10-CM

## 2018-12-03 DIAGNOSIS — M5020 Other cervical disc displacement, unspecified cervical region: ICD-10-CM

## 2018-12-03 DIAGNOSIS — K5900 Constipation, unspecified: ICD-10-CM

## 2018-12-03 DIAGNOSIS — Z8614 Personal history of Methicillin resistant Staphylococcus aureus infection: ICD-10-CM

## 2018-12-03 DIAGNOSIS — J189 Pneumonia, unspecified organism: ICD-10-CM

## 2018-12-03 DIAGNOSIS — Z8679 Personal history of other diseases of the circulatory system: ICD-10-CM

## 2018-12-03 DIAGNOSIS — G5600 Carpal tunnel syndrome, unspecified upper limb: ICD-10-CM

## 2018-12-03 DIAGNOSIS — S32000A Wedge compression fracture of unspecified lumbar vertebra, initial encounter for closed fracture: ICD-10-CM

## 2018-12-03 DIAGNOSIS — M797 Fibromyalgia: ICD-10-CM

## 2018-12-03 DIAGNOSIS — Z86010 Personal history of colonic polyps: ICD-10-CM

## 2018-12-03 DIAGNOSIS — F419 Anxiety disorder, unspecified: ICD-10-CM

## 2018-12-03 NOTE — Progress Notes
[facility] CLINIC NOTE    Subjective     SUBJECTIVE   Kristen Vaughn is a 58 y.o. female who presents for initial post-operative check s/p ACDF C6-7 on 10/28/2018.  She is doing fairly well.  Neck pain is tolerable.  She has continued low back pain.  She is not able to sit or stand for prolonged periods of time.  She has continued to refrain from smoking.           REVIEW OF SYSTEMS   Review of Systems   Constitutional: Positive for activity change and unexpected weight change.   HENT: Positive for drooling and postnasal drip.    Eyes: Positive for discharge.   Respiratory: Positive for stridor.    Gastrointestinal: Positive for constipation.   Endocrine: Positive for heat intolerance and polydipsia.   Genitourinary: Positive for frequency.   Musculoskeletal: Positive for arthralgias, back pain, gait problem, myalgias, neck pain and neck stiffness.   Allergic/Immunologic: Positive for food allergies.   Neurological: Positive for tremors, weakness, light-headedness and headaches.   Hematological: Bruises/bleeds easily.   Psychiatric/Behavioral: Positive for agitation, decreased concentration and sleep disturbance. The patient is nervous/anxious.    All other systems reviewed and are negative.      ALLERGIES     Allergies   Allergen Reactions   ??? Ascorbic Acid ANAPHYLAXIS   ??? Erythromycin RASH     Rash on trunk   ??? Strawberry UNKNOWN       MEDICATIONS     Current Outpatient Medications on File Prior to Visit   Medication Sig Dispense Refill   ??? acetaminophen (TYLENOL) 500 mg tablet Take two tablets by mouth every 6 hours as needed for Pain. Max of 4,000 mg of acetaminophen in 24 hours. 500 tablet 1   ??? albuterol (PROAIR HFA) 90 mcg/actuation inhaler Inhale 2 Puffs by mouth into the lungs every 6 hours as needed for Wheezing or Shortness of Breath. Shake well before use.     ??? ARIPiprazole (ABILIFY) 10 mg tablet Take 10 mg by mouth daily.     ??? baclofen (LIORESAL) 10 mg tablet Take 10 mg by mouth twice daily. In the presence of Kristen Rosales, MD , I have taken down these notes, Kristen Vaughn, Scribe. December 03, 2018 10:00 AM

## 2018-12-04 ENCOUNTER — Encounter: Admit: 2018-12-04 | Discharge: 2018-12-04

## 2018-12-04 MED FILL — TERIPARATIDE 20 MCG/DOSE - 600 MCG/2.4 ML SC PNIJ: 20 mcg/dose - 600 mcg/2.4 mL | SUBCUTANEOUS | 30 days supply | Qty: 2 | Fill #2 | Status: AC

## 2018-12-17 ENCOUNTER — Encounter: Admit: 2018-12-17 | Discharge: 2018-12-17

## 2019-01-01 ENCOUNTER — Encounter: Admit: 2019-01-01 | Discharge: 2019-01-01

## 2019-01-02 ENCOUNTER — Encounter: Admit: 2019-01-02 | Discharge: 2019-01-02

## 2019-01-05 ENCOUNTER — Encounter: Admit: 2019-01-05 | Discharge: 2019-01-05

## 2019-01-05 MED FILL — TERIPARATIDE 20 MCG/DOSE - 600 MCG/2.4 ML SC PNIJ: 20 mcg/dose - 600 mcg/2.4 mL | SUBCUTANEOUS | 30 days supply | Qty: 2 | Fill #3 | Status: AC

## 2019-01-06 ENCOUNTER — Encounter: Admit: 2019-01-06 | Discharge: 2019-01-06

## 2019-01-08 ENCOUNTER — Encounter: Admit: 2019-01-08 | Discharge: 2019-01-08

## 2019-01-08 ENCOUNTER — Encounter: Admit: 2019-01-08 | Discharge: 2019-01-08 | Payer: MEDICARE

## 2019-01-08 DIAGNOSIS — M810 Age-related osteoporosis without current pathological fracture: ICD-10-CM

## 2019-01-08 DIAGNOSIS — S32000A Wedge compression fracture of unspecified lumbar vertebra, initial encounter for closed fracture: ICD-10-CM

## 2019-01-08 DIAGNOSIS — Z981 Arthrodesis status: Principal | ICD-10-CM

## 2019-01-20 ENCOUNTER — Encounter: Admit: 2019-01-20 | Discharge: 2019-01-20

## 2019-02-02 ENCOUNTER — Encounter: Admit: 2019-02-02 | Discharge: 2019-02-02

## 2019-02-02 DIAGNOSIS — Z981 Arthrodesis status: Principal | ICD-10-CM

## 2019-02-02 DIAGNOSIS — M5412 Radiculopathy, cervical region: ICD-10-CM

## 2019-02-02 DIAGNOSIS — M4802 Spinal stenosis, cervical region: ICD-10-CM

## 2019-02-04 ENCOUNTER — Encounter: Admit: 2019-02-04 | Discharge: 2019-02-04

## 2019-02-04 ENCOUNTER — Ambulatory Visit: Admit: 2019-02-04 | Discharge: 2019-02-04 | Payer: MEDICARE

## 2019-02-04 ENCOUNTER — Ambulatory Visit: Admit: 2019-02-04 | Discharge: 2019-02-04

## 2019-02-04 DIAGNOSIS — M797 Fibromyalgia: ICD-10-CM

## 2019-02-04 DIAGNOSIS — G959 Disease of spinal cord, unspecified: ICD-10-CM

## 2019-02-04 DIAGNOSIS — Z8614 Personal history of Methicillin resistant Staphylococcus aureus infection: ICD-10-CM

## 2019-02-04 DIAGNOSIS — M4802 Spinal stenosis, cervical region: ICD-10-CM

## 2019-02-04 DIAGNOSIS — G5600 Carpal tunnel syndrome, unspecified upper limb: ICD-10-CM

## 2019-02-04 DIAGNOSIS — Z981 Arthrodesis status: Principal | ICD-10-CM

## 2019-02-04 DIAGNOSIS — M5412 Radiculopathy, cervical region: ICD-10-CM

## 2019-02-04 DIAGNOSIS — E785 Hyperlipidemia, unspecified: ICD-10-CM

## 2019-02-04 DIAGNOSIS — M5020 Other cervical disc displacement, unspecified cervical region: ICD-10-CM

## 2019-02-04 DIAGNOSIS — I10 Essential (primary) hypertension: ICD-10-CM

## 2019-02-04 DIAGNOSIS — J189 Pneumonia, unspecified organism: ICD-10-CM

## 2019-02-04 DIAGNOSIS — Z8679 Personal history of other diseases of the circulatory system: ICD-10-CM

## 2019-02-04 DIAGNOSIS — F419 Anxiety disorder, unspecified: ICD-10-CM

## 2019-02-04 DIAGNOSIS — Z86010 Personal history of colonic polyps: ICD-10-CM

## 2019-02-04 DIAGNOSIS — K219 Gastro-esophageal reflux disease without esophagitis: ICD-10-CM

## 2019-02-04 DIAGNOSIS — K5900 Constipation, unspecified: ICD-10-CM

## 2019-02-04 NOTE — Progress Notes
No current facility-administered medications on file prior to visit.        PHYSICAL EXAM     Vitals:    02/04/19 1346   Weight: 106.1 kg (234 lb)   Height: 172.7 cm (68)   PainSc: Eight        Pain Score: Eight  Body mass index is 35.58 kg/m???.    Constitutional: Alert, NAD  Head: Atraumatic  Eyes: EOMI  Respiratory: Unlabored breathing  Cardiovascular: Regular rate  Skin: No rashes or open wounds appreciated on back  Musculoskeletal: Strength stable  Neurologic: Sensation stable    RADIOLOGY     Imaging is reviewed.  There is no evidence of loosening or failure.  Implants are in stable position.  Alignment looks good.    MRI shows no evidence of any significant stenosis.  She has some mild degenerative disc disease.         ASSESSMENT / PLAN   Kristen Vaughn is a 58 y.o. female s/p ACDF C6-7 on 10/28/2018 with mechanical low back pain.      She is doing well from the standpoint of her neck.  I have no real restrictions for her.  I have discussed fall prevention with her.  I would not plan on any surgical intervention for her low back.  She has an appointment to see Durel Salts in the near future.  I think trying conservative measures to include further injections would be reasonable.  We will plan to follow up with her in 3 months, if not sooner if she has any issues.          In the presence of Criss Rosales, MD , I have taken down these notes, [name], Scribe. Feb 04, 2019 2:04 PM

## 2019-02-05 ENCOUNTER — Encounter: Admit: 2019-02-05 | Discharge: 2019-02-05

## 2019-02-06 ENCOUNTER — Encounter: Admit: 2019-02-06 | Discharge: 2019-02-06

## 2019-02-09 ENCOUNTER — Encounter: Admit: 2019-02-09 | Discharge: 2019-02-09

## 2019-02-11 ENCOUNTER — Encounter: Admit: 2019-02-11 | Discharge: 2019-02-11

## 2019-02-11 MED FILL — TERIPARATIDE 20 MCG/DOSE - 600 MCG/2.4 ML SC PNIJ: 20 mcg/dose - 600 mcg/2.4 mL | SUBCUTANEOUS | 30 days supply | Qty: 2 | Fill #4 | Status: AC

## 2019-02-12 ENCOUNTER — Encounter: Admit: 2019-02-12 | Discharge: 2019-02-12

## 2019-02-20 ENCOUNTER — Encounter: Admit: 2019-02-20 | Discharge: 2019-02-20

## 2019-02-27 ENCOUNTER — Encounter: Admit: 2019-02-27 | Discharge: 2019-02-27

## 2019-03-04 ENCOUNTER — Encounter: Admit: 2019-03-04 | Discharge: 2019-03-04

## 2019-03-04 DIAGNOSIS — K219 Gastro-esophageal reflux disease without esophagitis: Secondary | ICD-10-CM

## 2019-03-04 DIAGNOSIS — M533 Sacrococcygeal disorders, not elsewhere classified: Principal | ICD-10-CM

## 2019-03-04 DIAGNOSIS — G5600 Carpal tunnel syndrome, unspecified upper limb: Secondary | ICD-10-CM

## 2019-03-04 DIAGNOSIS — Z86010 Personal history of colonic polyps: Secondary | ICD-10-CM

## 2019-03-04 DIAGNOSIS — M797 Fibromyalgia: Secondary | ICD-10-CM

## 2019-03-04 DIAGNOSIS — G959 Disease of spinal cord, unspecified: Secondary | ICD-10-CM

## 2019-03-04 DIAGNOSIS — F419 Anxiety disorder, unspecified: Secondary | ICD-10-CM

## 2019-03-04 DIAGNOSIS — Z8679 Personal history of other diseases of the circulatory system: Secondary | ICD-10-CM

## 2019-03-04 DIAGNOSIS — I10 Essential (primary) hypertension: Secondary | ICD-10-CM

## 2019-03-04 DIAGNOSIS — M5020 Other cervical disc displacement, unspecified cervical region: Secondary | ICD-10-CM

## 2019-03-04 DIAGNOSIS — M4802 Spinal stenosis, cervical region: Secondary | ICD-10-CM

## 2019-03-04 DIAGNOSIS — M5442 Lumbago with sciatica, left side: Secondary | ICD-10-CM

## 2019-03-04 DIAGNOSIS — J189 Pneumonia, unspecified organism: Secondary | ICD-10-CM

## 2019-03-04 DIAGNOSIS — Z8614 Personal history of Methicillin resistant Staphylococcus aureus infection: Secondary | ICD-10-CM

## 2019-03-04 DIAGNOSIS — E785 Hyperlipidemia, unspecified: Secondary | ICD-10-CM

## 2019-03-04 DIAGNOSIS — K5900 Constipation, unspecified: Secondary | ICD-10-CM

## 2019-03-04 DIAGNOSIS — G8929 Other chronic pain: Secondary | ICD-10-CM

## 2019-03-04 NOTE — Progress Notes
[facility] CLINIC NOTE       SUBJECTIVE:   Patient is a 58yo female with PMH anxiety, cervical stenosis (s/p decompression/fusion by Dr Liam Rogers 2020), fibromyalgia, and low back pain. She was last seen by our clinic in 11/2017. Previously she had TF L5-S1 followed by SI joint injections x2 for LBP, and had relief after the third injection. Today she reports her pain has worsened the last 4 months. Starts in lower back, radiates down left butt cheek, back of thigh, stops at the knee. Sometimes she feels like her back is going to 'snap at the bottom.' Sitting, walking distances, standing too long all worsen the pain. Pain is 7/10 today.+Associated numbness/tingling of the leg which was problematic in the past. She hasn't had any recent falls, but in the past she had issues with falling after the numbness/tingling set in, so she wants to get it under control now before that happens again. She uses SPC on right for ambulation. Walks her dog daily but no other exercise. She has been taking Tylenol 1000mg  tylenol BID lately because she lost her bottle of Celebrex and insurance won't cover a refill until 6/20. Voltaren gel doesn't help much.          Review of Systems   Constitutional: Positive for activity change and unexpected weight change.   HENT: Negative.    Eyes: Positive for itching.   Respiratory: Positive for apnea and cough.    Cardiovascular: Negative.    Gastrointestinal: Positive for abdominal distention and nausea.   Endocrine: Positive for heat intolerance and polydipsia.   Genitourinary: Positive for frequency.   Musculoskeletal: Positive for arthralgias, back pain, gait problem, myalgias and neck stiffness.   Skin: Positive for rash.   Allergic/Immunologic: Positive for environmental allergies and food allergies.   Neurological: Positive for numbness.   Hematological: Negative.    Psychiatric/Behavioral: Positive for decreased concentration, dysphoric mood and sleep disturbance. The patient is nervous/anxious.    All other systems reviewed and are negative.          Current Outpatient Medications:   ???  acetaminophen (TYLENOL) 500 mg tablet, Take two tablets by mouth every 6 hours as needed for Pain. Max of 4,000 mg of acetaminophen in 24 hours., Disp: 500 tablet, Rfl: 1  ???  albuterol (PROAIR HFA) 90 mcg/actuation inhaler, Inhale 2 Puffs by mouth into the lungs every 6 hours as needed for Wheezing or Shortness of Breath. Shake well before use., Disp: , Rfl:   ???  baclofen (LIORESAL) 10 mg tablet, Take 10 mg by mouth twice daily., Disp: , Rfl:   ???  calcium carb and citrate-vit D3 600 mg calcium- 500 unit ER tablet, Take 1 tablet by mouth three times daily., Disp: 300 tablet, Rfl: 11  ???  cetirizine (ZYRTEC) 10 mg tablet, Take 10 mg by mouth daily as needed for Allergy symptoms., Disp: , Rfl:   ???  Cholecalciferol (Vitamin D3) (VITAMIN D-3) 2,000 unit cap, Take two capsules by mouth daily., Disp: 300 capsule, Rfl: 11  ???  clonazePAM (KLONOPIN) 0.5 mg tablet, Take 0.5 mg by mouth daily as needed., Disp: , Rfl:   ???  cloNIDine (CATAPRESS) 0.1 mg tablet, Take 0.1 mg by mouth at bedtime daily., Disp: , Rfl:   ???  diclofenac (VOLTAREN) 1 % topical gel, Apply 4 g topically to affected area four times daily., Disp: , Rfl:   ???  duloxetine DR (CYMBALTA) 30 mg capsule, Take 30 mg by mouth daily., Disp: , Rfl:   ???  duloxetine DR (CYMBALTA) 60 mg capsule, Take 60 mg by mouth daily., Disp: , Rfl:   ???  insulin pen needles (disposable) (BD UF NANO PEN NEEDLES) 32 gauge x 5/32 pen needle, Use one each as directed as Needed. Use with insulin injections., Disp: 300 each, Rfl: 3  ???  insulin pen needles (disposable) (BD ULTRA-FINE MINI PEN NEEDLE) 31 gauge x 3/16 pen needle, Use 1 each as directed as Needed. Use with Forteo pen., Disp: 300 each, Rfl: 3  ???  lisinopril (PRINIVIL; ZESTRIL) 10 mg tablet, Take 10 mg by mouth daily., Disp: , Rfl: ???  omeprazole DR(+) (PRILOSEC) 40 mg capsule, Take 40 mg by mouth daily before breakfast., Disp: , Rfl:   ???  pregabalin (LYRICA) 200 mg capsule, Take one capsule by mouth twice daily., Disp: 180 capsule, Rfl: 1  ???  simvastatin (ZOCOR) 20 mg tablet, Take 40 mg by mouth at bedtime daily., Disp: , Rfl:   ???  teriparatide (FORTEO) 20 mcg/dose - 600 mcg/2.4 mL pnij injection pen, Inject 20 mcg under the skin daily., Disp: 2.4 mL, Rfl: 3  ???  teriparatide(+) (FORTEO) 20 mcg/dose - 600 mcg/2.4 mL pnij injection pen, Inject twenty mcg under the skin daily., Disp: 2.4 mL, Rfl: 6  ???  traZODone (DESYREL) 100 mg tablet, Take 100 mg by mouth at bedtime as needed., Disp: , Rfl:   Allergies   Allergen Reactions   ??? Ascorbic Acid ANAPHYLAXIS   ??? Erythromycin RASH     Rash on trunk   ??? Strawberry UNKNOWN     Physical Exam  Vitals:    03/04/19 1327   BP: 112/61   Pulse: 62   Resp: 20   Temp: 37 ???C (98.6 ???F)   TempSrc: Oral   SpO2: 98%   Weight: 106.6 kg (235 lb)   Height: 172.7 cm (68)   PainSc: Seven        Pain Score: Seven  Body mass index is 35.73 kg/m???.    Gen: Alert & Oriented X 3  HEENT: EOMI  Neck: Supple, no elevated JVP  Heart: Extremities well perfused  Lungs: non labored breathing  Abdomen: Soft, non-tender, non-distended  Skin: no gross lesions appreciated  Ext: purposeful movement of extremities   MS:  (+) facet loading  (+) exacerbation of LBP with left MMT   Root Right Left   Hip Flexion L2 5 5   Knee Flexion L5/S1 5 5   Knee Extension L3 5 5   Dorsiflexion L4 5 5   Plantarflexion S1 5 5   EHL Extension L5 5 5     Neuro:  DTR's 2+ in bilateral patella   Babinski Plantar Reflex is Downgoing Bilaterally   Lower Extremity Tone Normal            IMPRESSION:  1. Sacral pain    2. Chronic bilateral low back pain with left-sided sciatica    3. Obesity (BMI 30-39.9)        PLAN:   In summary, this is a 58 y.o. with a chronic >1y history of low back pain. In the past her low back pain responded well to SI joint injections. Recent MRI (April 2020) suggests possible sacral ala fracture, which does correlate to area of TTP. D/w patient the need to r/o sacral fracture before proceeding with repeating steroid injection.     1. Lifestyle: discussed importance of healthy weight.   2. Medications: No changes at this time. May need more vitamin D if fracture present (  mild deficiency, last checked 2019).   3. Interventions: Consider left or bilateral SI joint injection after MRI is done. Will postpone injection at least 6w if MRI shows sacral fracture.   4. Diagnostic studies: Ordering sacral MRI.   5. Follow up: will plan to see the patient back for reevaluation after MRI completed.  They were encouraged to call/contact the clinic with any additional concerns, questions, or change in symptoms.       Opal Sidles, DO  Physical Med & Rehabilitation, PGY2    ATTESTATION    I personally performed the key portions of the E/M visit, discussed case with resident and concur with resident documentation of history, physical exam, assessment, and treatment plan unless otherwise noted.    Ms. Fam is a 58 year old female with history of cervical stenosis status post ACDF by Dr. Liam Rogers, fibromyalgia, and anxiety, presents today for follow-up on left axial low back pain.  We have previously performed transforaminal epidural steroid use without benefit.  We have had sacroiliac joint injections, which did provide benefit.  More recently she has had lumbar spine MRI ordered by Dr. Liam Rogers, which demonstrated T2 hyperintensity within the sacral ala, which could be consistent with insufficiency fracture.  VAS pain score is rated as a 7/10.  She continues on Lyrica with some benefit.  She utilizes as needed acetaminophen.    On examination she demonstrates tenderness of the left L5-S1 facet joint, left PSIS, left sacral border.  She has decreased range of motion lumbar flexion and lumbar extension.  She is neurologically intact.  No upper motor neuron signs. MRI lumbar spine from 01/08/2019 was personally reviewed demonstrated T2 hyperintensity within the sacral ala, concerning for insufficiency fracture.  There is mild to moderate lower facet joint arthropathy.    Ms. Puma is a 58 year old female with history of cervical ACDF, who presents for follow-up on left axial low back pain.  History and physical examination consistent with sacral pain secondary to sacral alar insufficiency fracture.  She also has a degree of left arthropathy, although I believe the greatest area of pain is from the sacrum.  I would recommend getting MRI of sacrum for further evaluation.  If she continues to have increase fluid in the sacrum, I would recommend relative rest.  We would need to avoid injection at that time.  If she does not have any significant fluid, I recommend a left sacroiliac joint injection.  We could consider decreasing her dose of Lyrica as her stenosis pain has improved.  We will see her back after MRI.    Staff name: Curly Rim, MD  Date: 03/04/19

## 2019-03-05 ENCOUNTER — Encounter: Admit: 2019-03-05 | Discharge: 2019-03-05

## 2019-03-05 ENCOUNTER — Ambulatory Visit: Admit: 2019-03-04 | Discharge: 2019-03-05

## 2019-03-05 DIAGNOSIS — E669 Obesity, unspecified: Secondary | ICD-10-CM

## 2019-03-05 MED ORDER — FORTEO 20 MCG/DOSE - 600 MCG/2.4 ML SC PNIJ
20 ug | Freq: Every day | SUBCUTANEOUS | 3 refills | 28.00000 days | Status: DC
Start: 2019-03-05 — End: 2019-07-07
  Filled 2019-03-16: qty 2, 30d supply, fill #1

## 2019-03-06 ENCOUNTER — Encounter: Admit: 2019-03-06 | Discharge: 2019-03-06

## 2019-03-09 ENCOUNTER — Encounter: Admit: 2019-03-09 | Discharge: 2019-03-09

## 2019-03-16 ENCOUNTER — Encounter: Admit: 2019-03-16 | Discharge: 2019-03-16

## 2019-03-25 ENCOUNTER — Encounter: Admit: 2019-03-25 | Discharge: 2019-03-25

## 2019-03-25 ENCOUNTER — Ambulatory Visit: Admit: 2019-03-25 | Discharge: 2019-03-25

## 2019-03-25 DIAGNOSIS — M533 Sacrococcygeal disorders, not elsewhere classified: Secondary | ICD-10-CM

## 2019-04-01 ENCOUNTER — Encounter: Admit: 2019-04-01 | Discharge: 2019-04-01

## 2019-04-01 ENCOUNTER — Ambulatory Visit: Admit: 2019-04-01 | Discharge: 2019-04-02

## 2019-04-01 DIAGNOSIS — G8929 Other chronic pain: Secondary | ICD-10-CM

## 2019-04-01 DIAGNOSIS — F419 Anxiety disorder, unspecified: Secondary | ICD-10-CM

## 2019-04-01 DIAGNOSIS — I10 Essential (primary) hypertension: Secondary | ICD-10-CM

## 2019-04-01 DIAGNOSIS — G959 Disease of spinal cord, unspecified: Secondary | ICD-10-CM

## 2019-04-01 DIAGNOSIS — M461 Sacroiliitis, not elsewhere classified: Principal | ICD-10-CM

## 2019-04-01 DIAGNOSIS — Z86010 Personal history of colonic polyps: Secondary | ICD-10-CM

## 2019-04-01 DIAGNOSIS — Z8679 Personal history of other diseases of the circulatory system: Secondary | ICD-10-CM

## 2019-04-01 DIAGNOSIS — M5020 Other cervical disc displacement, unspecified cervical region: Secondary | ICD-10-CM

## 2019-04-01 DIAGNOSIS — E785 Hyperlipidemia, unspecified: Secondary | ICD-10-CM

## 2019-04-01 DIAGNOSIS — J189 Pneumonia, unspecified organism: Secondary | ICD-10-CM

## 2019-04-01 DIAGNOSIS — M797 Fibromyalgia: Secondary | ICD-10-CM

## 2019-04-01 DIAGNOSIS — M4802 Spinal stenosis, cervical region: Secondary | ICD-10-CM

## 2019-04-01 DIAGNOSIS — Z8614 Personal history of Methicillin resistant Staphylococcus aureus infection: Secondary | ICD-10-CM

## 2019-04-01 DIAGNOSIS — K219 Gastro-esophageal reflux disease without esophagitis: Secondary | ICD-10-CM

## 2019-04-01 DIAGNOSIS — M545 Low back pain: Secondary | ICD-10-CM

## 2019-04-01 DIAGNOSIS — G5600 Carpal tunnel syndrome, unspecified upper limb: Secondary | ICD-10-CM

## 2019-04-01 DIAGNOSIS — K5900 Constipation, unspecified: Secondary | ICD-10-CM

## 2019-04-01 NOTE — Progress Notes
SPINE CENTER CLINIC NOTE       SUBJECTIVE:   Patient is a 58yo female with PMH anxiety, cervical stenosis (s/p decompression/fusion by Dr Liam Rogers 2020), fibromyalgia, and low back pain. She was last seen by our clinic in 02/2019.  At that time, she was referred for MRI of the sacrum. Please see diagnostic section for full details. Since that time, the patient reports that her pain has been unchanged. She is taking Tylenol and Celebrex which previously provided her relief but is minimal now. VAS pain score is rated as a 7/10. She has associated numbness and tingling in the left leg. Denies any recent falls or balance difficulties. She continues to use a cane for ambulation.  Denies loss of bowel or bladder function. She is interested in repeated SI joint injection. She has had this done previously x3 with the last one providing the most benefit that lasted 6-8 months.          Review of Systems   Constitutional: Positive for activity change and unexpected weight change.   HENT: Positive for drooling.    Eyes: Negative.    Respiratory: Negative.    Cardiovascular: Negative.    Gastrointestinal: Positive for constipation.   Endocrine: Positive for heat intolerance, polydipsia and polyphagia.   Genitourinary: Negative.    Musculoskeletal: Positive for arthralgias, back pain, gait problem, myalgias, neck pain and neck stiffness.   Skin: Negative.    Allergic/Immunologic: Positive for food allergies.   Neurological: Positive for weakness, light-headedness and numbness.   Hematological: Bruises/bleeds easily.   Psychiatric/Behavioral: Positive for agitation, decreased concentration and sleep disturbance. The patient is nervous/anxious.    All other systems reviewed and are negative.      Current Outpatient Medications:   ???  acetaminophen (TYLENOL) 500 mg tablet, Take two tablets by mouth every 6 hours as needed for Pain. Max of 4,000 mg of acetaminophen in 24 hours., Disp: 500 tablet, Rfl: 1 ???  albuterol (PROAIR HFA) 90 mcg/actuation inhaler, Inhale 2 Puffs by mouth into the lungs every 6 hours as needed for Wheezing or Shortness of Breath. Shake well before use., Disp: , Rfl:   ???  baclofen (LIORESAL) 10 mg tablet, Take 10 mg by mouth twice daily., Disp: , Rfl:   ???  calcium carb and citrate-vit D3 600 mg calcium- 500 unit ER tablet, Take 1 tablet by mouth three times daily., Disp: 300 tablet, Rfl: 11  ???  cetirizine (ZYRTEC) 10 mg tablet, Take 10 mg by mouth daily as needed for Allergy symptoms., Disp: , Rfl:   ???  Cholecalciferol (Vitamin D3) (VITAMIN D-3) 2,000 unit cap, Take two capsules by mouth daily., Disp: 300 capsule, Rfl: 11  ???  clonazePAM (KLONOPIN) 0.5 mg tablet, Take 0.5 mg by mouth daily as needed., Disp: , Rfl:   ???  cloNIDine (CATAPRESS) 0.1 mg tablet, Take 0.1 mg by mouth at bedtime daily., Disp: , Rfl:   ???  diclofenac (VOLTAREN) 1 % topical gel, Apply 4 g topically to affected area four times daily., Disp: , Rfl:   ???  duloxetine DR (CYMBALTA) 30 mg capsule, Take 30 mg by mouth daily., Disp: , Rfl:   ???  duloxetine DR (CYMBALTA) 60 mg capsule, Take 60 mg by mouth daily., Disp: , Rfl:   ???  insulin pen needles (disposable) (BD UF NANO PEN NEEDLES) 32 gauge x 5/32 pen needle, Use one each as directed as Needed. Use with insulin injections., Disp: 300 each, Rfl: 3  ???  insulin pen  needles (disposable) (BD ULTRA-FINE MINI PEN NEEDLE) 31 gauge x 3/16 pen needle, Use 1 each as directed as Needed. Use with Forteo pen., Disp: 300 each, Rfl: 3  ???  lisinopril (PRINIVIL; ZESTRIL) 10 mg tablet, Take 10 mg by mouth daily., Disp: , Rfl:   ???  omeprazole DR(+) (PRILOSEC) 40 mg capsule, Take 40 mg by mouth daily before breakfast., Disp: , Rfl:   ???  pregabalin (LYRICA) 200 mg capsule, Take one capsule by mouth twice daily., Disp: 180 capsule, Rfl: 1  ???  simvastatin (ZOCOR) 20 mg tablet, Take 40 mg by mouth at bedtime daily., Disp: , Rfl: ???  teriparatide (FORTEO) 20 mcg/dose - 600 mcg/2.4 mL pnij injection pen, Inject 20 mcg under the skin daily., Disp: 2.4 mL, Rfl: 3  ???  teriparatide(+) (FORTEO) 20 mcg/dose - 600 mcg/2.4 mL pnij injection pen, Inject twenty mcg under the skin daily., Disp: 2.4 mL, Rfl: 6  ???  traZODone (DESYREL) 100 mg tablet, Take 100 mg by mouth at bedtime as needed., Disp: , Rfl:   Allergies   Allergen Reactions   ??? Ascorbic Acid ANAPHYLAXIS   ??? Erythromycin RASH     Rash on trunk   ??? Strawberry UNKNOWN     Physical Exam  Vitals:    04/01/19 1308   BP: 114/60   BP Source: Arm, Right Upper   Patient Position: Sitting   Pulse: 69   Resp: 20   Temp: 36.7 ???C (98 ???F)   TempSrc: Oral   SpO2: 99%   Weight: 106.6 kg (235 lb)   Height: 172.7 cm (68)   PainSc: Seven        Pain Score: Seven  Body mass index is 35.73 kg/m???.  General: 58 y.o. female appears stated age, in no acute distress  HEENT: Normocephalic, atraumatic  Neck: No thyroidmegaly  Cardiovascular: Well perfused  Pulmonary: Unlabored respirations  Extremities: No cyanosis, clubbing, or edema  Skin: Warm and dry  Psychiatric:  Appropriate mood and affect  Musculoskeletal: Decreased range of motion with lumbar extension and lateral rotation, otherwise full range of motion with flexion. Tender to palpation at PSIS left greater than right extending down the left sacral border more than L5-S1, otherwise nontender to palpation along lumbar paraspinals, lumbar facets, bilateral gluteus medius, and bilateral trochanteric bursa. Facet loading is positive bilaterally.   Neurologic: Lower extremity myotomes are all 5/5.  Lower extremity dermatomes are all intact to light touch.  Deep tendon reflexes are symmetric at patella and achilles.  Negative slump test bilaterally.  Downward Babinski.  No ankle clonus.     Diagnostics:  MRI of sacrococcygeal region on 03/25/2019 was personally reviewed and demonstrated T1 hypointense and T2 hyperintense lesion adjacent to the left sacroiliac joint on the inferior sacral margin consistent with degenerative changes.  No evidence of fracture noted.         IMPRESSION:  1. Chronic bilateral low back pain without sciatica    2. Sacroiliitis (HCC)    3. Arthropathy of left sacroiliac joint          PLAN:   1. Lifestyle modifications. Recommend activity as tolerated.  2. Medications. No changes at this time.  3. Therapy. None indicated at this time.  4. Imaging. None indicated at this time. Reviewed MRI of sacrum with patient today.  5. Interventions. Recommend scheduling for bilateral SI injections for therapeutic benefit.  6. Follow up. Patient to follow up for procedure or sooner if needed.

## 2019-04-02 DIAGNOSIS — M47818 Spondylosis without myelopathy or radiculopathy, sacral and sacrococcygeal region: Secondary | ICD-10-CM

## 2019-04-08 ENCOUNTER — Encounter: Admit: 2019-04-08 | Discharge: 2019-04-08

## 2019-04-13 ENCOUNTER — Encounter: Admit: 2019-04-13 | Discharge: 2019-04-13

## 2019-04-17 ENCOUNTER — Encounter: Admit: 2019-04-17 | Discharge: 2019-04-17

## 2019-04-17 MED FILL — FORTEO 20 MCG/DOSE - 600 MCG/2.4 ML SC PNIJ: 20 mcg/dose - 600 mcg/2.4 mL | SUBCUTANEOUS | 30 days supply | Qty: 2 | Fill #2 | Status: AC

## 2019-04-27 ENCOUNTER — Ambulatory Visit: Admit: 2019-04-27 | Discharge: 2019-04-27

## 2019-04-27 ENCOUNTER — Encounter: Admit: 2019-04-27 | Discharge: 2019-04-27

## 2019-04-27 DIAGNOSIS — M5020 Other cervical disc displacement, unspecified cervical region: Secondary | ICD-10-CM

## 2019-04-27 DIAGNOSIS — Z8679 Personal history of other diseases of the circulatory system: Secondary | ICD-10-CM

## 2019-04-27 DIAGNOSIS — K219 Gastro-esophageal reflux disease without esophagitis: Secondary | ICD-10-CM

## 2019-04-27 DIAGNOSIS — G5600 Carpal tunnel syndrome, unspecified upper limb: Secondary | ICD-10-CM

## 2019-04-27 DIAGNOSIS — F419 Anxiety disorder, unspecified: Secondary | ICD-10-CM

## 2019-04-27 DIAGNOSIS — K5900 Constipation, unspecified: Secondary | ICD-10-CM

## 2019-04-27 DIAGNOSIS — J189 Pneumonia, unspecified organism: Secondary | ICD-10-CM

## 2019-04-27 DIAGNOSIS — E785 Hyperlipidemia, unspecified: Secondary | ICD-10-CM

## 2019-04-27 DIAGNOSIS — Z86010 Personal history of colonic polyps: Secondary | ICD-10-CM

## 2019-04-27 DIAGNOSIS — G959 Disease of spinal cord, unspecified: Secondary | ICD-10-CM

## 2019-04-27 DIAGNOSIS — I10 Essential (primary) hypertension: Secondary | ICD-10-CM

## 2019-04-27 DIAGNOSIS — Z8614 Personal history of Methicillin resistant Staphylococcus aureus infection: Secondary | ICD-10-CM

## 2019-04-27 DIAGNOSIS — M4802 Spinal stenosis, cervical region: Secondary | ICD-10-CM

## 2019-04-27 DIAGNOSIS — M461 Sacroiliitis, not elsewhere classified: Secondary | ICD-10-CM

## 2019-04-27 DIAGNOSIS — M797 Fibromyalgia: Secondary | ICD-10-CM

## 2019-04-27 MED ORDER — DIAZEPAM 5 MG PO TAB
5 mg | ORAL | 0 refills | Status: AC | PRN
Start: 2019-04-27 — End: ?

## 2019-04-27 MED ORDER — TRIAMCINOLONE ACETONIDE 40 MG/ML IJ SUSP
80 mg | Freq: Once | EPIDURAL | 0 refills | Status: CP
Start: 2019-04-27 — End: ?

## 2019-04-27 MED ORDER — IOHEXOL 300 MG IODINE/ML IV SOLN
2 mL | Freq: Once | 0 refills | Status: CP
Start: 2019-04-27 — End: ?

## 2019-04-27 MED ORDER — LIDOCAINE (PF) 10 MG/ML (1 %) IJ SOLN
2 mL | Freq: Once | INTRAMUSCULAR | 0 refills | Status: CP
Start: 2019-04-27 — End: ?

## 2019-04-27 NOTE — Procedures
Attending Surgeon: Curly Rim, MD    Anesthesia: Local    Pre-Procedure Diagnosis:   1. Sacroiliitis (HCC)        Post-Procedure Diagnosis:   1. Sacroiliitis (HCC)        SI Joint Inject Anesth/Steroid (Procedure)  Location: sacroiliac joint : Bilateral sacroiliac joint\    Consent:   Consent obtained: verbal and written  Consent given by: patient  Risks discussed: arrhythmia, damage to surrounding structures, infection, nerve damage, skin discoloration, pain and soft tissue reaction    Discussed with patient the purpose of the treatment/procedure, other ways of treating my condition, including no treatment/ procedure and the risks and benefits of the alternatives. Patient has decided to proceed with treatment/procedure.        Universal Protocol:  Relevant documents: relevant documents present and verified  Imaging studies: imaging studies available  Site marked: the operative site was marked  Patient identity confirmed: Patient identify confirmed verbally with patient.        Time out: Immediately prior to procedure a time out was called to verify the correct patient, procedure, equipment, support staff and site/side marked as required      Procedures Details:   Indications: pain and diagnostic evaluation   Prep: 2% chlorhexidine  Guidance: fluoroscopy  Needle size: 22 G  Patient tolerance: Patient tolerated the procedure well with no immediate complications. Pressure was applied, and hemostasis was accomplished.  Comments: DESCRIPTION OF PROCEDURE:  The procedure risks and benefits were explained.  Informed consent was obtained from the patient.  The patient was placed in prone position on the fluoroscopy table.  Blood pressure cuff and oxygen saturation monitors were attached and the patient was monitored throughout the procedure.  The left SI joint was identified with the use of fluoroscopy in the AP view.  The skin was prepped using Chlorhexadine and draped in aseptic fashion.  C-arm was rotated obliquely towards the right side to line up the anterior and posterior margins of the SI joint.  Skin and subcutaneous tissue were anesthetized using 2.5 mL of 1 percent lidocaine with 25-gauge needle.  A 3-1/2-inch 22-gauge spinal needle was advanced parallel to the x-ray beam towards the lower third of the joint line.  The needle was advanced slowly until the tip of the needle made contact with the bone.  The needle was walked off from the bone to the joint space.  We subsequently injected 0.4 mL of Isovue contrast dye.  An arthrogram was seen.  After negative aspiration, a solution containing 1 mL of 1 percent lidocaine and 40 mg of triamcinolone was injected.  The stylet was re-inserted and needle was then removed.  There was no sensory or motor deficit in the extremity noted.     Attention was then taken to the right SI joint, which was identified with the use of fluoroscopy in the AP view.  The skin was prepped using Chlorhexadine and draped in aseptic fashion.  The C-arm was rotated obliquely towards the left side to line up the anterior and posterior margins at the SI joint.  The skin and subcutaneous tissue was anesthetized using 2.5 mL of 1 percent lidocaine with 25-gauge needle.  A 3-1/2-inch, 22-gauge spinal needle was advanced parallel to the x-ray beam towards the lower third of the joint line.  The needle was advanced slowly until the tip of the needle made contact with the bone.  The needle was walked off from the bone to the joint space.  We injected 0.4  mL of Isovue contrast dye and an arthrogram was seen.  After negative aspiration, a solution containing 1 mL of 1 percent lidocaine and 40 mg of triamcinolone was injected.  The stylet was re-inserted and the needle was then removed.  There was no sensory or motor deficit in the extremity noted.      After the procedure, the patient's blood pressure, heart rate, oxygen saturation, and VAS were recorded in the chart.  There were no complications.  The patient tolerated the procedure well and was brought to the recovery room for observation in stable condition and discharged with written discharge instructions.     PLAN OF CARE:  The patient is to follow up in the interventional spine clinic in 4-6 weeks.     The patient was advised to contact the interventional spine center for any of the following:    Fever, chills, or night sweats.  New onset severe sharp pain.  Any new upper or lower extremity weakness or numbness.  Any questions regarding the procedure.     If unable to contact the interventional spine center, the patient was instructed to go to the local emergency room.           Estimated blood loss: none or minimal  Specimens: none  Patient tolerated the procedure well with no immediate complications. Pressure was applied, and hemostasis was accomplished.

## 2019-04-27 NOTE — Progress Notes
SPINE CENTER  INTERVENTIONAL PAIN PROCEDURE HISTORY AND PHYSICAL    Chief Complaint   Patient presents with   ??? Procedure       HISTORY OF PRESENT ILLNESS:  Kristen Vaughn is a 58 y.o. year old female who presents for injection.  Denies fevers, chills, or recent hospitalizations.  No allergies to contrast, latex, seafood, iodine, or shellfish.  Patient denies blood thinning medications.        Medical History:   Diagnosis Date   ??? Anxiety and depression    ??? Carpal tunnel syndrome    ??? Cervical myelopathy (HCC)    ??? Cervical stenosis of spine    ??? Chest pain    ??? Constipation    ??? Essential hypertension    ??? Fibromyalgia    ??? GERD (gastroesophageal reflux disease)    ??? Herniated disc, cervical    ??? History of abnormal electrocardiogram 01/2016   ??? History of colon polyps 2015   ??? History of MRSA infection 2019    right hand/[facility]   ??? Hyperlipemia 08/09/2009   ??? Pneumonia        Surgical History:   Procedure Laterality Date   ??? HX HYSTERECTOMY  1985   ??? UPPER GASTROINTESTINAL ENDOSCOPY  2015   ??? COLONOSCOPY  2015    hx colon polyps x2   ??? CARDIOVASCULAR STRESS TEST  01/2016   ??? FINGER TRIGGER RELEASE Right 2019   ??? CARPAL TUNNEL RELEASE Right 2019   ??? ANTERIOR CERVICAL DECOMPRESSION AND FUSION AT CERVICAL 6-7 N/A 10/28/2018    Performed by Criss Rosales, MD at [facility] OR   ??? ARTHRODESIS SPINE ANTERIOR INTERBODY WITH DISCECTOMY/ OSTEOPHYTECTOMY/ DECOMPRESSION - CERVICAL BELOW C2 - EACH ADDITIONAL INTERSPACE N/A 10/28/2018    Performed by Criss Rosales, MD at [facility] OR   ??? ANTERIOR INSTRUMENTATION - 2 TO 3 VERTEBRAL SEGMENTS N/A 10/28/2018    Performed by Criss Rosales, MD at [facility] OR   ??? DILATION AND CURETTAGE  1980's       family history includes Cancer in her sister and another family member; Coronary Artery Disease in her father, mother, and other family members; Premature Heart Disease in an other family member; Stroke in an other family member.    Social History     Socioeconomic History ??? Marital status: Single     Spouse name: Not on file   ??? Number of children: Not on file   ??? Years of education: Not on file   ??? Highest education level: Not on file   Occupational History   ??? Not on file   Tobacco Use   ??? Smoking status: Former Smoker     Packs/day: 1.00     Years: 40.00     Pack years: 40.00     Types: Cigarettes     Last attempt to quit: 12/18/2017     Years since quitting: 1.3   ??? Smokeless tobacco: Never Used   ??? Tobacco comment: variable 1-1/2 a day   Substance and Sexual Activity   ??? Alcohol use: Yes     Alcohol/week: 0.0 standard drinks     Comment: social   ??? Drug use: Yes     Types: Marijuana     Comment: rare for back pain   ??? Sexual activity: Not on file   Other Topics Concern   ??? Not on file   Social History Narrative   ??? Not on file  Allergies   Allergen Reactions   ??? Ascorbic Acid ANAPHYLAXIS   ??? Erythromycin RASH     Rash on trunk   ??? Strawberry UNKNOWN       Vitals:    04/27/19 1422   Temp: 36.2 ???C (97.2 ???F)       REVIEW OF SYSTEMS: 10 point ROS obtained and negative except for back pain      PHYSICAL EXAM:  General: 58 y.o. female appears stated age, in no acute distress  HEENT: Normocephalic, atraumatic  Neck: No thyroidmegaly  Cardiovascular: Well perfused  Pulmonary: Unlabored respirations  Extremities: No cyanosis, clubbing, or edema  Skin: No lesions seen on exposed skin  Psychiatric:  Appropriate mood and affect  Musculoskeletal: No atrophy.   Neurologic: Antigravity strength in all extremities. CN II -XII grossly intact.  Alert and oriented x 3.         IMPRESSION:    1. Sacroiliitis (HCC)         PLAN:   Bilateral SI joint injection

## 2019-04-27 NOTE — Discharge Instructions - Supplementary Instructions
GENERAL POST PROCEDURE INSTRUCTIONS    Physician: _________________________________    Procedure Completed Today:  o Joint Injection (hip, knee, shoulder)  o Cervical Epidural Steroid Injection  o Cervical Transforaminal Steroid Injection  o Trigger Point Injection  o Other___________________________ o Thoracic Epidural Steroid Injection  o Lumbar Epidural Steroid Injection  o Lumbar Transforaminal Steroid Injection  o Facet Joint Injection     Important information following your procedure today:  o You may drive today     o If you had sedation, you may NOT drive today  ? Rest at home for the next 6 hours.  You may then begin to resume your normal activities.  ? DO NOT drive any vehicle, operate any power tools, drink alcohol, make any major decisions, or sign any legal documents for the next 12 hours.  1. Pain relief may not be immediate. It is possible you may even experience an increase in pain during the first 24-48 hours followed by a gradual decrease of your pain.  2. Though the procedure is generally safe and complications are rare, we do ask that you be aware of any of the following:  ? Any swelling, persistent redness, new bleeding or drainage from the site of the injection.  ? You should not experience a severe headache.  ? You should not run a fever over 101oF.  ? New onset of sharp, severe back and or neck pain.  ? New onset of upper or lower extremity numbness or weakness.  ? New difficulty controlling bowel or bladder function after injection.  ? New shortness of breath.  If any of these occur, please call to report this occurrence to a nurse at [phone]. If you are calling after 4:00pm, on the weekends or holidays please call [phone] and ask to have the pain management resident physician on call for the physician paged or go to your local emergency room.   3. You may experience soreness at the injection site. Ice can be applied at 20 minute intervals for the first 24 hours. The following day you may alternate ice with heat if you are experiencing muscle tightness, otherwise continue with ice. Ice works best at decreasing pain. Avoid application of direct heat, hot showers or hot tubs today.  4. Avoid strenuous activity today. You many resume your regular activities and exercise tomorrow.  5. Patients with diabetes may see an elevation in blood sugars for 7-10 days after the injection. It is important to pay close attention to your diet, check your blood sugars daily and repost extreme elevations to the physician that treats your diabetes.  6. Patients taking daily blood thinners can resume their regular dose this evening.  7. It is important that you take all medications ordered by your pain physician. Taking medications as ordered is an important part of you pain care plan. If you cannot continue the medication plan, please notify the physician.    Possible side effects to steroids that may occur:  ? Flushing or redness of the face  ? Irritability  ? Fluid retention  ? Change in women's menses      Follow up appointment as needed if in the event you are unable to keep an appointment please notify the scheduler 24 hours in advance at [phone]. If you have questions for the surgery center, call [facility] at [phone].

## 2019-05-04 ENCOUNTER — Encounter: Admit: 2019-05-04 | Discharge: 2019-05-04

## 2019-05-04 DIAGNOSIS — G959 Disease of spinal cord, unspecified: Secondary | ICD-10-CM

## 2019-05-04 DIAGNOSIS — Z981 Arthrodesis status: Secondary | ICD-10-CM

## 2019-05-04 DIAGNOSIS — M4802 Spinal stenosis, cervical region: Secondary | ICD-10-CM

## 2019-05-04 DIAGNOSIS — M5412 Radiculopathy, cervical region: Secondary | ICD-10-CM

## 2019-05-04 NOTE — Patient Instructions
It was a pleasure seeing you in clinic today.    Neiko Trivedi RN, CNC  Dr. Joshua T. Bunch/Ortho Spine Surgery  [facility]  [facility]  [address]  [place],  [address]  Phone: [phone]   Fax [phone]  Scheduling [phone]  [email or web address]

## 2019-05-12 ENCOUNTER — Encounter: Admit: 2019-05-12 | Discharge: 2019-05-12 | Payer: MEDICARE

## 2019-05-12 MED FILL — FORTEO 20 MCG/DOSE - 600 MCG/2.4 ML SC PNIJ: 20 mcg/dose (600mcg/2.4mL) | SUBCUTANEOUS | 30 days supply | Qty: 2 | Fill #3 | Status: AC

## 2019-05-13 ENCOUNTER — Ambulatory Visit: Admit: 2019-05-13 | Discharge: 2019-05-13

## 2019-05-13 ENCOUNTER — Encounter: Admit: 2019-05-13 | Discharge: 2019-05-13

## 2019-05-13 DIAGNOSIS — M5412 Radiculopathy, cervical region: Secondary | ICD-10-CM

## 2019-05-13 DIAGNOSIS — M797 Fibromyalgia: Secondary | ICD-10-CM

## 2019-05-13 DIAGNOSIS — Z981 Arthrodesis status: Secondary | ICD-10-CM

## 2019-05-13 DIAGNOSIS — Z86010 Personal history of colonic polyps: Secondary | ICD-10-CM

## 2019-05-13 DIAGNOSIS — F419 Anxiety disorder, unspecified: Secondary | ICD-10-CM

## 2019-05-13 DIAGNOSIS — G959 Disease of spinal cord, unspecified: Secondary | ICD-10-CM

## 2019-05-13 DIAGNOSIS — M4802 Spinal stenosis, cervical region: Secondary | ICD-10-CM

## 2019-05-13 DIAGNOSIS — Z8679 Personal history of other diseases of the circulatory system: Secondary | ICD-10-CM

## 2019-05-13 DIAGNOSIS — E785 Hyperlipidemia, unspecified: Secondary | ICD-10-CM

## 2019-05-13 DIAGNOSIS — K219 Gastro-esophageal reflux disease without esophagitis: Secondary | ICD-10-CM

## 2019-05-13 DIAGNOSIS — G5600 Carpal tunnel syndrome, unspecified upper limb: Secondary | ICD-10-CM

## 2019-05-13 DIAGNOSIS — K5900 Constipation, unspecified: Secondary | ICD-10-CM

## 2019-05-13 DIAGNOSIS — Z8614 Personal history of Methicillin resistant Staphylococcus aureus infection: Secondary | ICD-10-CM

## 2019-05-13 DIAGNOSIS — I10 Essential (primary) hypertension: Secondary | ICD-10-CM

## 2019-05-13 DIAGNOSIS — J189 Pneumonia, unspecified organism: Secondary | ICD-10-CM

## 2019-05-13 DIAGNOSIS — M5020 Other cervical disc displacement, unspecified cervical region: Secondary | ICD-10-CM

## 2019-05-13 NOTE — Progress Notes
SPINE CENTER CLINIC NOTE    Subjective     SUBJECTIVE   Kristen Vaughn is a 58 y.o. female who returns for repeat evaluation 6 1/2 months s/p ACDF C6-7 on 10/28/2018.  She complains of quite a bit of stiffness.  Other than this, she reports she is doing fairly well.  She is ambulating with a cane.  She reports she had another fall.  She has fallen three times in the last month and a half.  She reports she is smoking.         REVIEW OF SYSTEMS   Review of Systems   Musculoskeletal: Positive for back pain and neck stiffness.   All other systems reviewed and are negative.      ALLERGIES     Allergies   Allergen Reactions   ??? Ascorbic Acid ANAPHYLAXIS   ??? Erythromycin RASH     Rash on trunk   ??? Strawberry UNKNOWN       MEDICATIONS     Current Outpatient Medications on File Prior to Visit   Medication Sig Dispense Refill   ??? acetaminophen (TYLENOL) 500 mg tablet Take two tablets by mouth every 6 hours as needed for Pain. Max of 4,000 mg of acetaminophen in 24 hours. 500 tablet 1   ??? albuterol (PROAIR HFA) 90 mcg/actuation inhaler Inhale 2 Puffs by mouth into the lungs every 6 hours as needed for Wheezing or Shortness of Breath. Shake well before use.     ??? baclofen (LIORESAL) 10 mg tablet Take 10 mg by mouth twice daily.     ??? calcium carb and citrate-vit D3 600 mg calcium- 500 unit ER tablet Take 1 tablet by mouth three times daily. 300 tablet 11   ??? cetirizine (ZYRTEC) 10 mg tablet Take 10 mg by mouth daily as needed for Allergy symptoms.     ??? Cholecalciferol (Vitamin D3) (VITAMIN D-3) 2,000 unit cap Take two capsules by mouth daily. 300 capsule 11   ??? clonazePAM (KLONOPIN) 0.5 mg tablet Take 0.5 mg by mouth daily as needed.     ??? cloNIDine (CATAPRESS) 0.1 mg tablet Take 0.1 mg by mouth at bedtime daily.     ??? diclofenac (VOLTAREN) 1 % topical gel Apply 4 g topically to affected area four times daily.     ??? duloxetine DR (CYMBALTA) 30 mg capsule Take 30 mg by mouth daily. ??? duloxetine DR (CYMBALTA) 60 mg capsule Take 60 mg by mouth daily.     ??? insulin pen needles (disposable) (BD UF NANO PEN NEEDLES) 32 gauge x 5/32 pen needle Use one each as directed as Needed. Use with insulin injections. 300 each 3   ??? insulin pen needles (disposable) (BD ULTRA-FINE MINI PEN NEEDLE) 31 gauge x 3/16 pen needle Use 1 each as directed as Needed. Use with Forteo pen. 300 each 3   ??? lisinopril (PRINIVIL; ZESTRIL) 10 mg tablet Take 10 mg by mouth daily.     ??? omeprazole DR(+) (PRILOSEC) 40 mg capsule Take 40 mg by mouth daily before breakfast.     ??? pregabalin (LYRICA) 200 mg capsule Take one capsule by mouth twice daily. 180 capsule 1   ??? simvastatin (ZOCOR) 20 mg tablet Take 40 mg by mouth at bedtime daily.     ??? teriparatide (FORTEO) 20 mcg/dose - 600 mcg/2.4 mL pnij injection pen Inject 20 mcg under the skin daily. 2.4 mL 3   ??? teriparatide(+) (FORTEO) 20 mcg/dose - 600 mcg/2.4 mL pnij injection pen Inject twenty  mcg under the skin daily. 2.4 mL 6   ??? traZODone (DESYREL) 100 mg tablet Take 100 mg by mouth at bedtime as needed.       Current Facility-Administered Medications on File Prior to Visit   Medication Dose Route Frequency Provider Last Rate Last Dose   ??? diazePAM (VALIUM) tablet 5 mg  5 mg Oral PRN Curly Rim, MD   5 mg at 04/27/19 1429       PHYSICAL EXAM     Vitals:    05/13/19 1217   BP: 110/76   Pulse: 66   Resp: 18   SpO2: 99%   Weight: 106.6 kg (235 lb)   Height: 172.7 cm (68)   PainSc: Seven        Pain Score: Seven  Body mass index is 35.73 kg/m???.    Constitutional: Alert, NAD  Head: Atraumatic  Eyes: EOMI  Respiratory: Unlabored breathing  Cardiovascular: Regular rate  Skin: No rashes or open wounds appreciated on back  Musculoskeletal: Strength stable  Neurologic: Sensation stable    RADIOLOGY     She has some subsidence at C6-7.  I don't see any new fractures above this.  This is probably slightly progressed from last time and consistent with patient's multiple falls. ASSESSMENT / PLAN   Kristen Vaughn is a 58 y.o. female 6 1/2 months s/p ACDF C6-7 on 10/28/2018.      She has unfortunately had several falls in the last month.  I think some physical therapy to work on her gait and the stiffness is certainly reasonable.  She is smoking.  I have stressed the importance of stopping smoking and refraining from nicotine products and that her continuing to smoke places her at increased for nonunion.  She voices understanding.  We will plan to see her back in six months.  If she has any issues in between now and then, I would be happy to see her back sooner.           In the presence of Criss Rosales, MD , I have taken down these notes, [name], Scribe. May 13, 2019 12:33 PM

## 2019-05-26 ENCOUNTER — Encounter: Admit: 2019-05-26 | Discharge: 2019-05-26

## 2019-05-26 ENCOUNTER — Ambulatory Visit: Admit: 2019-05-26 | Discharge: 2019-05-27

## 2019-05-26 DIAGNOSIS — E785 Hyperlipidemia, unspecified: Secondary | ICD-10-CM

## 2019-05-26 DIAGNOSIS — M5020 Other cervical disc displacement, unspecified cervical region: Secondary | ICD-10-CM

## 2019-05-26 DIAGNOSIS — Z8249 Family history of ischemic heart disease and other diseases of the circulatory system: Secondary | ICD-10-CM

## 2019-05-26 DIAGNOSIS — Z8679 Personal history of other diseases of the circulatory system: Secondary | ICD-10-CM

## 2019-05-26 DIAGNOSIS — I10 Essential (primary) hypertension: Secondary | ICD-10-CM

## 2019-05-26 DIAGNOSIS — G959 Disease of spinal cord, unspecified: Secondary | ICD-10-CM

## 2019-05-26 DIAGNOSIS — J189 Pneumonia, unspecified organism: Secondary | ICD-10-CM

## 2019-05-26 DIAGNOSIS — E782 Mixed hyperlipidemia: Secondary | ICD-10-CM

## 2019-05-26 DIAGNOSIS — Z8614 Personal history of Methicillin resistant Staphylococcus aureus infection: Secondary | ICD-10-CM

## 2019-05-26 DIAGNOSIS — E6609 Other obesity due to excess calories: Secondary | ICD-10-CM

## 2019-05-26 DIAGNOSIS — M797 Fibromyalgia: Secondary | ICD-10-CM

## 2019-05-26 DIAGNOSIS — G5600 Carpal tunnel syndrome, unspecified upper limb: Secondary | ICD-10-CM

## 2019-05-26 DIAGNOSIS — R296 Repeated falls: Secondary | ICD-10-CM

## 2019-05-26 DIAGNOSIS — G5732 Lesion of lateral popliteal nerve, left lower limb: Secondary | ICD-10-CM

## 2019-05-26 DIAGNOSIS — K219 Gastro-esophageal reflux disease without esophagitis: Secondary | ICD-10-CM

## 2019-05-26 DIAGNOSIS — F419 Anxiety disorder, unspecified: Secondary | ICD-10-CM

## 2019-05-26 DIAGNOSIS — M79605 Pain in left leg: Secondary | ICD-10-CM

## 2019-05-26 DIAGNOSIS — K5900 Constipation, unspecified: Secondary | ICD-10-CM

## 2019-05-26 DIAGNOSIS — Z86010 Personal history of colonic polyps: Secondary | ICD-10-CM

## 2019-05-26 DIAGNOSIS — M4802 Spinal stenosis, cervical region: Secondary | ICD-10-CM

## 2019-05-26 MED ORDER — ROSUVASTATIN 10 MG PO TAB
10 mg | ORAL_TABLET | Freq: Every day | ORAL | 3 refills | 90.00000 days | Status: DC
Start: 2019-05-26 — End: 2019-09-11

## 2019-05-26 NOTE — Progress Notes
Patient was identified as a high fall risk with fall risk screen today in clinic.   Yellow fall risk banner now active in patient's chart. High Fall risk sign posted outside of the patient's room.       High Risk Assessment:    Three or more co-existing diagnosis: Y  Incontinency/urgency/frequency (diuretic use): N  Fall in the past 6 months: Y  Taking four or more prescribed medications (consider medications that could affect gait, balance, strength, judgement): Y  Impaired functional mobility: Y    Patient will be accompanied by a staff member while ambulating in clinic via assist or wheelchair. High fall risk status will be communicated to receiving staff if patient visits another department while under [place] staff care. Patient verbalizes an understanding.

## 2019-05-26 NOTE — Progress Notes
Date of Service: 05/26/2019    Kristen Vaughn is a 58 y.o. female.       HPI     Patient is a 58 year old white female that has a history of hypertension, hyperlipidemia and currently on simvastatin, family history of premature coronary artery disease in first-degree relatives, obesity with a BMI of 38.18 a recent history of tobacco use.    This patient does have osteoarthritis and she does use a cane for ambulation.  She did undergo cervical spine surgery on 10/28/2018.  She recovered well from the surgery till she fell after trying to pick up a cigarette but from the street and sliding of the curve.  Fortunately, the imaging studies of the C-spine did not demonstrate any fracture.    Patient also has had issues with falling and that is probably multifactorial and due to physical deconditioning, obesity, osteoarthritis, gait disturbances, she does use a cane for ambulation.    From a cardiac standpoint patient has been reporting episodes of chest pain that lasts typically 30-40 seconds, the occur with physical activity, i.e. walking from the living room to the kitchen, patient has not had any heart palpitations.    She reports quitting smoking 4 days ago.    This patient was evaluated with a perfusion imaging study in February 2020 the tomographic pattern was negative for ischemia.    The most recent cholesterol profile was remarkable for a total cholesterol 191 mg/dL, triglycerides 454 mg/dL, HDL 57 mg/dL, and LDL 098 mg/dL.  Patient is currently on simvastatin 40 mg p.o. daily.         Vitals:    05/26/19 1001 05/26/19 1014   BP: 124/86 (!) 122/90   BP Source: Arm, Left Upper Arm, Right Upper   Pulse: 70    Temp: 36.7 ???C (98 ???F)    SpO2: 97%    Weight: 110.6 kg (243 lb 12.8 oz)    Height: 1.702 m (5' 7)    PainSc: Five      Body mass index is 38.18 kg/m???.     Past Medical History  Patient Active Problem List    Diagnosis Date Noted   ??? S/P cervical spinal fusion 02/04/2019 ??? Cervical stenosis of spine 10/28/2018   ??? Tobacco abuse 10/24/2018     Smoked 1/2 to 1 pack cigarettes daily for 40 years.  Stopped (almost) late October 2019     ??? Pre-operative cardiovascular examination 10/24/2018   ??? Osteoporosis without current pathological fracture 10/15/2017   ??? Cervical disc herniation 04/17/2017   ??? Stenosis of cervical spine 04/17/2017   ??? Cervical myelopathy (HCC) 04/17/2017   ??? Cervical radiculopathy 04/17/2017   ??? 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 Pain of left lower extremity 11/06/2016   ??? Hyperlipemia 08/09/2009   ??? Chest pain 08/09/2009     05/17 regadenoson sestamibi MPI: EF 74%, no ischemia.           Review of Systems   Constitution: Positive for malaise/fatigue and weight gain.   HENT: Positive for stridor.    Eyes: Negative.    Cardiovascular: Positive for chest pain and dyspnea on exertion.   Respiratory: Positive for cough, shortness of breath, sputum production and wheezing.    Endocrine: Positive for polyphagia.   Hematologic/Lymphatic: Negative.    Skin: Negative.    Musculoskeletal: Positive for arthritis, back pain, falls, joint pain, muscle cramps, muscle weakness, myalgias, neck pain and stiffness.   Gastrointestinal: Negative.    Genitourinary:  Negative.    Neurological: Positive for weakness.   Psychiatric/Behavioral: Negative.    Allergic/Immunologic: Positive for environmental allergies.       Physical Exam  General Appearance: obese  Skin: warm, moist, no ulcers or xanthomas  Eyes: conjunctivae and lids normal, pupils are equal and round  Lips & Oral Mucosa: no pallor or cyanosis  Neck Veins: neck veins are flat, neck veins are not distended  Chest Inspection: chest is normal in appearance  Respiratory Effort: breathing comfortably, no respiratory distress  Auscultation/Percussion: lungs clear to auscultation, no rales or rhonchi, no wheezing  Cardiac Rhythm: regular rhythm and normal rate  Cardiac Auscultation: S1, S2 normal, no rub, no gallop Murmurs: no murmur  Carotid Arteries: normal carotid upstroke bilaterally, no bruit  Abdominal aorta: could not be examined due to obese adomen  Lower Extremity Edema: no lower extremity edema  Abdominal Exam: soft, non-tender, no masses, bowel sounds normal  Liver & Spleen: no organomegaly  Language and Memory: patient responsive and seems to comprehend information  Neurologic Exam: neurological assessment grossly intact        Cardiovascular Studies      Problems Addressed Today  Encounter Diagnoses   Name Primary?   ??? Mixed hyperlipidemia Yes   ??? Neuropathy of left peroneal nerve    ??? Pain of left lower extremity    ??? Class 2 obesity due to excess calories without serious comorbidity with body mass index (BMI) of 38.0 to 38.9 in adult    ??? Family history of coronary artery disease    ??? Hypertension, unspecified type    ??? Falls frequently        Assessment and Plan     In summary: This is a 58 year old white female with a history of hypertension, hyperlipidemia, family history of premature CAD, obesity with a BMI of 38.18, recent tobacco use, presents with atypical chest pain, previous evaluation with a perfusion imaging study in February 2020 did not demonstrate ischemia.    Plan:    1.  Discontinue simvastatin  2.  Start rosuvastatin 10 mg p.o. daily  3.  Further evaluation with a 2D echo Doppler study???we will follow-up on the results and will call the patient with further recommendations  4.  We did discuss about continuing tobacco cessation and weight reduction, I did recommend to increase the level of physical activity and also decrease the caloric intake.  5.  Follow-up office visit in approximately 8 months         Current Medications (including today's revisions)  ??? acetaminophen (TYLENOL) 500 mg tablet Take two tablets by mouth every 6 hours as needed for Pain. Max of 4,000 mg of acetaminophen in 24 hours.   ??? albuterol (PROAIR HFA) 90 mcg/actuation inhaler Inhale 2 Puffs by mouth into the lungs every 6 hours as needed for Wheezing or Shortness of Breath. Shake well before use.   ??? baclofen (LIORESAL) 10 mg tablet Take 10 mg by mouth twice daily.   ??? calcium carbonate (CALCIUM 600 PO) Take 1 tablet by mouth daily.   ??? celecoxib (CELEBREX) 200 mg capsule Take 200 mg by mouth daily.   ??? cetirizine (ZYRTEC) 10 mg tablet Take 10 mg by mouth daily as needed for Allergy symptoms.   ??? Cholecalciferol (Vitamin D3) (VITAMIN D-3) 2,000 unit cap Take two capsules by mouth daily. (Patient taking differently: Take 1 capsule by mouth daily.)   ??? clonazePAM (KLONOPIN) 0.5 mg tablet Take 0.5 mg by mouth daily as needed.   ???  diclofenac (VOLTAREN) 1 % topical gel Apply 4 g topically to affected area four times daily.   ??? duloxetine DR (CYMBALTA) 60 mg capsule Take 60 mg by mouth twice daily.   ??? insulin pen needles (disposable) (BD UF NANO PEN NEEDLES) 32 gauge x 5/32 pen needle Use one each as directed as Needed. Use with insulin injections.   ??? insulin pen needles (disposable) (BD ULTRA-FINE MINI PEN NEEDLE) 31 gauge x 3/16 pen needle Use 1 each as directed as Needed. Use with Forteo pen.   ??? lisinopril (PRINIVIL; ZESTRIL) 10 mg tablet Take 10 mg by mouth daily.   ??? omeprazole DR(+) (PRILOSEC) 40 mg capsule Take 40 mg by mouth daily before breakfast.   ??? pregabalin (LYRICA) 200 mg capsule Take one capsule by mouth twice daily. (Patient taking differently: Take 200 mg by mouth three times daily.)   ??? simvastatin (ZOCOR) 40 mg tablet Take 40 mg by mouth at bedtime daily.   ??? teriparatide (FORTEO) 20 mcg/dose - 600 mcg/2.4 mL pnij injection pen Inject 20 mcg under the skin daily.

## 2019-05-26 NOTE — Telephone Encounter
05/26/2019 3:47 PM Patient saw Dr. Virgina Organ on 05/26/19.  Dr. Virgina Organ ordered an echo, to be completed at [facility].  No auth required.  Ref # [id number].  Faxed order and PA form to [facility].

## 2019-05-26 NOTE — Patient Instructions
1.  Stop taking simvastatin.    2.  Start taking rosuvastatin, 10 mg by mouth daily.    3. Complete echocardiogram at [facility].  We will fax an order and Kathe Mariner Scheduling will call you to make an appointment.

## 2019-06-05 ENCOUNTER — Encounter: Admit: 2019-06-05 | Discharge: 2019-06-05

## 2019-06-05 ENCOUNTER — Ambulatory Visit: Admit: 2019-06-05 | Discharge: 2019-06-05

## 2019-06-05 DIAGNOSIS — I10 Essential (primary) hypertension: Secondary | ICD-10-CM

## 2019-06-05 DIAGNOSIS — E785 Hyperlipidemia, unspecified: Secondary | ICD-10-CM

## 2019-06-08 ENCOUNTER — Encounter: Admit: 2019-06-08 | Discharge: 2019-06-08

## 2019-06-10 ENCOUNTER — Encounter: Admit: 2019-06-10 | Discharge: 2019-06-10

## 2019-06-10 NOTE — Telephone Encounter
SI Joint Inject Anesth/Steroid (Procedure)  Location: sacroiliac joint : Bilateral sacroiliac joint  04/27/2019    Patient states injection worked well for a few weeks. However, pain has returned and she feels has worsened in her back. She states that nothing helps relieve the pain. Made follow up appointment for patient to discuss her symptoms.

## 2019-06-11 ENCOUNTER — Encounter: Admit: 2019-06-11 | Discharge: 2019-06-11

## 2019-06-11 MED FILL — FORTEO 20 MCG/DOSE - 600 MCG/2.4 ML SC PNIJ: 20 mcg/dose - 600 mcg/2.4 mL | SUBCUTANEOUS | 30 days supply | Qty: 2 | Fill #4 | Status: AC

## 2019-07-06 ENCOUNTER — Encounter: Admit: 2019-07-06 | Discharge: 2019-07-06

## 2019-07-07 ENCOUNTER — Encounter: Admit: 2019-07-07 | Discharge: 2019-07-07

## 2019-07-07 MED ORDER — FORTEO 20 MCG/DOSE - 600 MCG/2.4 ML SC PNIJ
20 ug | Freq: Every day | SUBCUTANEOUS | 3 refills | 28.00000 days | Status: DC
Start: 2019-07-07 — End: 2019-11-26
  Filled 2019-08-07: qty 2, 30d supply, fill #2

## 2019-07-08 ENCOUNTER — Encounter: Admit: 2019-07-08 | Discharge: 2019-07-08

## 2019-07-08 NOTE — Telephone Encounter
Patient calling to ask to speak with Presley Raddle. States that she has pain all over and gets charlie horses at night that wake her up. Requesting another bone scan. RN informed patient that there wouldn't be much we could do for her pain in the osteoporosis clinic and that her best bet was to follow up with Dr. Tamala Julian (pt is scheduled for a FUV 10/16). Also warned patient that insurance likely would not cover another bone scan as she already had one at the start of the year. Patient states "Please ask and see if there is anything you can do". Told patient I would talk to Alexsis who is covering for Alysse.

## 2019-07-10 ENCOUNTER — Ambulatory Visit: Admit: 2019-07-10 | Discharge: 2019-07-11 | Payer: MEDICARE

## 2019-07-10 ENCOUNTER — Encounter: Admit: 2019-07-10 | Discharge: 2019-07-10

## 2019-07-10 DIAGNOSIS — Z86010 Personal history of colonic polyps: Secondary | ICD-10-CM

## 2019-07-10 DIAGNOSIS — M461 Sacroiliitis, not elsewhere classified: Secondary | ICD-10-CM

## 2019-07-10 DIAGNOSIS — M797 Fibromyalgia: Secondary | ICD-10-CM

## 2019-07-10 DIAGNOSIS — I10 Essential (primary) hypertension: Secondary | ICD-10-CM

## 2019-07-10 DIAGNOSIS — K5900 Constipation, unspecified: Secondary | ICD-10-CM

## 2019-07-10 DIAGNOSIS — M545 Low back pain: Secondary | ICD-10-CM

## 2019-07-10 DIAGNOSIS — Z8614 Personal history of Methicillin resistant Staphylococcus aureus infection: Secondary | ICD-10-CM

## 2019-07-10 DIAGNOSIS — M4802 Spinal stenosis, cervical region: Secondary | ICD-10-CM

## 2019-07-10 DIAGNOSIS — F419 Anxiety disorder, unspecified: Secondary | ICD-10-CM

## 2019-07-10 DIAGNOSIS — G5600 Carpal tunnel syndrome, unspecified upper limb: Secondary | ICD-10-CM

## 2019-07-10 DIAGNOSIS — M5020 Other cervical disc displacement, unspecified cervical region: Secondary | ICD-10-CM

## 2019-07-10 DIAGNOSIS — Z8679 Personal history of other diseases of the circulatory system: Secondary | ICD-10-CM

## 2019-07-10 DIAGNOSIS — J189 Pneumonia, unspecified organism: Secondary | ICD-10-CM

## 2019-07-10 DIAGNOSIS — G959 Disease of spinal cord, unspecified: Secondary | ICD-10-CM

## 2019-07-10 DIAGNOSIS — E785 Hyperlipidemia, unspecified: Secondary | ICD-10-CM

## 2019-07-10 DIAGNOSIS — K219 Gastro-esophageal reflux disease without esophagitis: Secondary | ICD-10-CM

## 2019-07-10 MED ORDER — RXAMB AMITR/GABAPEN/EMU OIL 4/4/10% CREAM (COMPOUND)
Freq: Three times a day (TID) | 3 refills | 22.50000 days | Status: CN | PRN
Start: 2019-07-10 — End: ?

## 2019-07-10 NOTE — Progress Notes
Obtained patient's verbal consent to treat them and their agreement to [facility] financial policy and NPP via this telehealth visit during the [facility]    SPINE CENTER CLINIC NOTE       SUBJECTIVE:   Kristen Vaughn is a 58 year old female with history of anxiety, cervical stenosis (s/p decompression/fusion by Dr Liam Rogers 2020), fibromyalgia who presents for follow-up on low back pain status post bilateral sacroiliac joint injection performed on 04/27/19.  Patient reports  relief with the injection. Patient reports at least 60-70% reduction in pain for 3-4 weeks. Patient reports for those few weeks she as more functional and felt her pain was more manageable. Overall, she was happy with the temporary pain relief she did have. She is interested in longer lasting procedures. She has had 4 sacroiliac joint injections. Pain has since recurred. Patient states with the recent weather changes she has noticed increasing pain.  She continues to have low back pain. She has intermittent numbness in the left leg. She continues to use a cane for ambulation. No recent falls. Pain is at preprocedure level. VAS pain score is rated a 7/10. She has not made any changes in her medications. She is asking for an increased dose of Lyrica.  Denies overt weakness in extremities.  Denies recent falls. Denies any loss of control of bowel or bladder.          Review of Systems   Musculoskeletal: Positive for back pain.   All other systems reviewed and are negative.      Current Outpatient Medications:   ?  acetaminophen (TYLENOL) 500 mg tablet, Take two tablets by mouth every 6 hours as needed for Pain. Max of 4,000 mg of acetaminophen in 24 hours., Disp: 500 tablet, Rfl: 1  ?  albuterol (PROAIR HFA) 90 mcg/actuation inhaler, Inhale 2 Puffs by mouth into the lungs every 6 hours as needed for Wheezing or Shortness of Breath. Shake well before use., Disp: , Rfl: ?  AMITR/GABAPEN/EMU OIL 12/26/08% CREAM (COMPOUND), Apply 0.5grams to affected area three times daily PRN for pain, Disp: 50 g, Rfl: 3  ?  baclofen (LIORESAL) 10 mg tablet, Take 10 mg by mouth twice daily., Disp: , Rfl:   ?  calcium carbonate (CALCIUM 600 PO), Take 1 tablet by mouth daily., Disp: , Rfl:   ?  celecoxib (CELEBREX) 200 mg capsule, Take 200 mg by mouth daily., Disp: , Rfl:   ?  cetirizine (ZYRTEC) 10 mg tablet, Take 10 mg by mouth daily as needed for Allergy symptoms., Disp: , Rfl:   ?  Cholecalciferol (Vitamin D3) (VITAMIN D-3) 2,000 unit cap, Take two capsules by mouth daily. (Patient taking differently: Take 1 capsule by mouth daily.), Disp: 300 capsule, Rfl: 11  ?  clonazePAM (KLONOPIN) 0.5 mg tablet, Take 0.5 mg by mouth daily as needed., Disp: , Rfl:   ?  diclofenac (VOLTAREN) 1 % topical gel, Apply 4 g topically to affected area four times daily., Disp: , Rfl:   ?  duloxetine DR (CYMBALTA) 60 mg capsule, Take 60 mg by mouth twice daily., Disp: , Rfl:   ?  insulin pen needles (disposable) (BD UF NANO PEN NEEDLES) 32 gauge x 5/32 pen needle, Use one each as directed as Needed. Use with insulin injections., Disp: 300 each, Rfl: 3  ?  insulin pen needles (disposable) (BD ULTRA-FINE MINI PEN NEEDLE) 31 gauge x 3/16 pen needle, Use 1 each as directed as Needed. Use with Forteo pen., Disp: 300 each, Rfl:  3  ?  lisinopril (PRINIVIL; ZESTRIL) 10 mg tablet, Take 10 mg by mouth daily., Disp: , Rfl:   ?  omeprazole DR(+) (PRILOSEC) 40 mg capsule, Take 40 mg by mouth daily before breakfast., Disp: , Rfl:   ?  pregabalin (LYRICA) 200 mg capsule, Take one capsule by mouth twice daily. (Patient taking differently: Take 200 mg by mouth three times daily.), Disp: 180 capsule, Rfl: 1  ?  rosuvastatin (CRESTOR) 10 mg tablet, Take one tablet by mouth daily. Indications: excessive fat in the blood, Disp: 30 tablet, Rfl: 3  ?  teriparatide (FORTEO) 20 mcg/dose - 600 mcg/2.4 mL pnij injection pen, Inject 20 mcg under the skin daily., Disp: 2.4 mL, Rfl: 3    Current Facility-Administered Medications:   ?  diazePAM (VALIUM) tablet 5 mg, 5 mg, Oral, PRN, Curly Rim, MD, 5 mg at 04/27/19 1429  Allergies   Allergen Reactions   ? Ascorbic Acid ANAPHYLAXIS   ? Erythromycin RASH     Rash on trunk   ? Strawberry UNKNOWN     Physical Exam  Vitals:    07/10/19 1408   Weight: 106.6 kg (235 lb)   Height: 172.7 cm (68)   PainSc: Seven        Pain Score: Seven  Body mass index is 35.73 kg/m?Marland Kitchen  General: 58 y.o. female appears stated age, in no acute distress  HEENT: Normocephalic, atraumatic  Neck: No thyroidmegaly  Cardiovascular: Well perfused  Pulmonary: Unlabored respirations  Extremities: No cyanosis, clubbing, or edema  Skin: No lesions seen on exposed skin  Psychiatric:  Appropriate mood and affect  Musculoskeletal: No atrophy. Decreased range of motion with flexion and extension.  Facet loading is positive bilaterally  Neurologic: Antigravity strength in all extremities. CN II -XII grossly intact.  Alert and oriented x 3. Neural tension signs are negative.         IMPRESSION:  1. Sacroiliitis (HCC)    2. Chronic bilateral low back pain without sciatica          PLAN:    1.  Lifestyle modifications.  Recommend activity as tolerated.  Avoid provocative maneuvers.  Keep spine in neutral position.  2.  Medications.  Would like to try compounding topical cream. Sent prescription to main campus pharmacy.   3.  Therapy. None indicated at this time.   4.  Imaging. None indicated at this time.   5.  Interventions. Recommend scheduling for radiofrequency ablation.  6.  Follow-up.  Patient to follow-up for procedure.     Todays visit took place via face-to-face encounter utilizing Zoom application. Total time 18 minutes.  Estimated counseling time 13 minutes.  Counseled Kristen Vaughn regarding treatment options.

## 2019-07-13 ENCOUNTER — Encounter: Admit: 2019-07-13 | Discharge: 2019-07-13

## 2019-07-14 ENCOUNTER — Encounter: Admit: 2019-07-14 | Discharge: 2019-07-14

## 2019-07-14 NOTE — Telephone Encounter
-----   Message from Cloyd Stagers, RN sent at 07/13/2019  9:11 AM CDT -----  Regarding: Compound cream prescription  Emu compound cream is not covered by patient insurance and co pay is unaffordable. Is there another cream patient may be prescribed instead or have her get Voltaren gel? Thank you!

## 2019-07-14 NOTE — Telephone Encounter
Called patient back, left VM. Will let her know per Brett Fairy, APRN that she may try Salonpas, which is over the counter instead and to follow up with our office to let us know if beneficial.

## 2019-07-16 ENCOUNTER — Encounter: Admit: 2019-07-16 | Discharge: 2019-07-16

## 2019-07-16 DIAGNOSIS — M461 Sacroiliitis, not elsewhere classified: Secondary | ICD-10-CM

## 2019-08-06 ENCOUNTER — Encounter: Admit: 2019-08-06 | Discharge: 2019-08-06

## 2019-08-07 ENCOUNTER — Encounter: Admit: 2019-08-07 | Discharge: 2019-08-07

## 2019-08-10 ENCOUNTER — Encounter: Admit: 2019-08-10 | Discharge: 2019-08-10

## 2019-08-10 DIAGNOSIS — M79605 Pain in left leg: Secondary | ICD-10-CM

## 2019-08-10 NOTE — Telephone Encounter
LVM for pt to return the call to [phone]

## 2019-08-11 ENCOUNTER — Encounter: Admit: 2019-08-11 | Discharge: 2019-08-11

## 2019-08-11 ENCOUNTER — Ambulatory Visit: Admit: 2019-08-11 | Discharge: 2019-08-11 | Payer: MEDICARE

## 2019-08-11 ENCOUNTER — Ambulatory Visit: Admit: 2019-08-11 | Discharge: 2019-08-11

## 2019-08-11 DIAGNOSIS — K5900 Constipation, unspecified: Secondary | ICD-10-CM

## 2019-08-11 DIAGNOSIS — G959 Disease of spinal cord, unspecified: Secondary | ICD-10-CM

## 2019-08-11 DIAGNOSIS — M79605 Pain in left leg: Secondary | ICD-10-CM

## 2019-08-11 DIAGNOSIS — M4802 Spinal stenosis, cervical region: Secondary | ICD-10-CM

## 2019-08-11 DIAGNOSIS — K219 Gastro-esophageal reflux disease without esophagitis: Secondary | ICD-10-CM

## 2019-08-11 DIAGNOSIS — Z8679 Personal history of other diseases of the circulatory system: Secondary | ICD-10-CM

## 2019-08-11 DIAGNOSIS — E785 Hyperlipidemia, unspecified: Secondary | ICD-10-CM

## 2019-08-11 DIAGNOSIS — J189 Pneumonia, unspecified organism: Secondary | ICD-10-CM

## 2019-08-11 DIAGNOSIS — F419 Anxiety disorder, unspecified: Secondary | ICD-10-CM

## 2019-08-11 DIAGNOSIS — G5600 Carpal tunnel syndrome, unspecified upper limb: Secondary | ICD-10-CM

## 2019-08-11 DIAGNOSIS — M797 Fibromyalgia: Secondary | ICD-10-CM

## 2019-08-11 DIAGNOSIS — M5020 Other cervical disc displacement, unspecified cervical region: Secondary | ICD-10-CM

## 2019-08-11 DIAGNOSIS — I10 Essential (primary) hypertension: Secondary | ICD-10-CM

## 2019-08-11 DIAGNOSIS — Z86010 Personal history of colonic polyps: Secondary | ICD-10-CM

## 2019-08-11 DIAGNOSIS — M461 Sacroiliitis, not elsewhere classified: Secondary | ICD-10-CM

## 2019-08-11 DIAGNOSIS — Z8614 Personal history of Methicillin resistant Staphylococcus aureus infection: Secondary | ICD-10-CM

## 2019-08-11 MED ORDER — IOHEXOL 240 MG IODINE/ML IV SOLN
2.5 mL | Freq: Once | EPIDURAL | 0 refills | Status: CP
Start: 2019-08-11 — End: ?
  Administered 2019-08-11: 17:00:00 2.5 mL via EPIDURAL

## 2019-08-11 MED ORDER — BUPIVACAINE (PF) 0.5 % (5 MG/ML) IJ SOLN
5 mL | Freq: Once | INTRAMUSCULAR | 0 refills | Status: CP
Start: 2019-08-11 — End: ?
  Administered 2019-08-11: 17:00:00 5 mL via INTRAMUSCULAR

## 2019-08-11 NOTE — Progress Notes
SPINE CENTER  INTERVENTIONAL PAIN PROCEDURE HISTORY AND PHYSICAL    No chief complaint on file.      HISTORY OF PRESENT ILLNESS:  Kristen Vaughn is a 58 y.o. year old female who presents for injection.  Denies fevers, chills, or recent hospitalizations.  No allergies to contrast, latex, seafood, iodine, or shellfish.  Patient denies blood thinning medications.        Medical History:   Diagnosis Date   ? Anxiety and depression    ? Carpal tunnel syndrome    ? Cervical myelopathy (HCC)    ? Cervical stenosis of spine    ? Chest pain    ? Constipation    ? Essential hypertension    ? Fibromyalgia    ? GERD (gastroesophageal reflux disease)    ? Herniated disc, cervical    ? History of abnormal electrocardiogram 01/2016   ? History of colon polyps 2015   ? History of MRSA infection 2019    right hand/[facility]   ? Hyperlipemia 08/09/2009   ? Pneumonia        Surgical History:   Procedure Laterality Date   ? HX HYSTERECTOMY  1985   ? UPPER GASTROINTESTINAL ENDOSCOPY  2015   ? COLONOSCOPY  2015    hx colon polyps x2   ? CARDIOVASCULAR STRESS TEST  01/2016   ? FINGER TRIGGER RELEASE Right 2019   ? CARPAL TUNNEL RELEASE Right 2019   ? ANTERIOR CERVICAL DECOMPRESSION AND FUSION AT CERVICAL 6-7 N/A 10/28/2018    Performed by Criss Rosales, MD at [facility] OR   ? ARTHRODESIS SPINE ANTERIOR INTERBODY WITH DISCECTOMY/ OSTEOPHYTECTOMY/ DECOMPRESSION - CERVICAL BELOW C2 - EACH ADDITIONAL INTERSPACE N/A 10/28/2018    Performed by Criss Rosales, MD at [facility] OR   ? ANTERIOR INSTRUMENTATION - 2 TO 3 VERTEBRAL SEGMENTS N/A 10/28/2018    Performed by Criss Rosales, MD at [facility] OR   ? DILATION AND CURETTAGE  1980's       family history includes Cancer in her sister and another family member; Coronary Artery Disease in her father, mother, and other family members; Premature Heart Disease in an other family member; Stroke in an other family member.    Social History     Socioeconomic History   ? Marital status: Single Spouse name: Not on file   ? Number of children: Not on file   ? Years of education: Not on file   ? Highest education level: Not on file   Occupational History   ? Not on file   Tobacco Use   ? Smoking status: Former Smoker     Packs/day: 1.00     Years: 40.00     Pack years: 40.00     Types: Cigarettes     Quit date: 12/18/2017     Years since quitting: 1.6   ? Smokeless tobacco: Never Used   ? Tobacco comment: variable 1-1/2 a day   Substance and Sexual Activity   ? Alcohol use: Yes     Alcohol/week: 0.0 standard drinks     Comment: social   ? Drug use: Yes     Types: Marijuana     Comment: rare for back pain   ? Sexual activity: Not on file   Other Topics Concern   ? Not on file   Social History Narrative   ? Not on file       Allergies   Allergen Reactions   ? Ascorbic Acid  ANAPHYLAXIS   ? Erythromycin RASH     Rash on trunk   ? Strawberry UNKNOWN       There were no vitals filed for this visit.    REVIEW OF SYSTEMS: 10 point ROS obtained and negative except for back pain      PHYSICAL EXAM:  General: 58 y.o. female appears stated age, in no acute distress  HEENT: Normocephalic, atraumatic  Neck: No thyroidmegaly  Cardiovascular: Well perfused  Pulmonary: Unlabored respirations  Extremities: No cyanosis, clubbing, or edema  Skin: No lesions seen on exposed skin  Psychiatric:  Appropriate mood and affect  Musculoskeletal: No atrophy.   Neurologic: Antigravity strength in all extremities. CN II -XII grossly intact.  Alert and oriented x 3.         IMPRESSION:    1. Sacroiliitis (HCC)         PLAN:   Bilateral SI joint injection with bupivacaine only

## 2019-08-11 NOTE — Progress Notes

## 2019-08-11 NOTE — Procedures
Attending Surgeon: Curly Rim, MD    Anesthesia: Local    Pre-Procedure Diagnosis:   1. Sacroiliitis (HCC)        Post-Procedure Diagnosis:   1. Sacroiliitis (HCC)        [place] [facility] SPINE SI JOINT INJECT ANESTH/STEROID PROC  Location: sacroiliac joint : Bilateral sacroiliac joint.    Consent:   Consent obtained: verbal and written  Consent given by: patient  Risks discussed: arrhythmia, damage to surrounding structures, infection, nerve damage, skin discoloration, pain and soft tissue reaction    Discussed with patient the purpose of the treatment/procedure, other ways of treating my condition, including no treatment/ procedure and the risks and benefits of the alternatives. Patient has decided to proceed with treatment/procedure.        Universal Protocol:  Relevant documents: relevant documents present and verified  Imaging studies: imaging studies available  Site marked: the operative site was marked  Patient identity confirmed: Patient identify confirmed verbally with patient.        Time out: Immediately prior to procedure a time out was called to verify the correct patient, procedure, equipment, support staff and site/side marked as required      Procedures Details:   Indications: diagnostic evaluation   Prep: 2% chlorhexidine  Guidance: fluoroscopy  Needle size: 22 G  Patient tolerance: Patient tolerated the procedure well with no immediate complications. Pressure was applied, and hemostasis was accomplished. Comments: DESCRIPTION OF PROCEDURE:  The procedure risks and benefits were explained.  Informed consent was obtained from the patient.  The patient was placed in prone position on the fluoroscopy table.  Blood pressure cuff and oxygen saturation monitors were attached and the patient was monitored throughout the procedure.  The left SI joint was identified with the use of fluoroscopy in the AP view.  The skin was prepped using Chlorhexadine and draped in aseptic fashion.  C-arm was rotated obliquely towards the right side to line up the anterior and posterior margins of the SI joint.  Skin and subcutaneous tissue were anesthetized using 2.5 mL of 1 percent lidocaine with 25-gauge needle.  A 3-1/2-inch 22-gauge spinal needle was advanced parallel to the x-ray beam towards the lower third of the joint line.  The needle was advanced slowly until the tip of the needle made contact with the bone.  The needle was walked off from the bone to the joint space.  We subsequently injected 0.4 mL of Isovue contrast dye.  An arthrogram was seen.  After negative aspiration, 2 of 0.5% bupivacaine was injected.  The stylet was re-inserted and needle was then removed.  There was no sensory or motor deficit in the extremity noted. Attention was then taken to the right SI joint, which was identified with the use of fluoroscopy in the AP view.  The skin was prepped using Chlorhexadine and draped in aseptic fashion.  The C-arm was rotated obliquely towards the left side to line up the anterior and posterior margins at the SI joint.  The skin and subcutaneous tissue was anesthetized using 2.5 mL of 1 percent lidocaine with 25-gauge needle.  A 3-1/2-inch, 22-gauge spinal needle was advanced parallel to the x-ray beam towards the lower third of the joint line.  The needle was advanced slowly until the tip of the needle made contact with the bone.  The needle was walked off from the bone to the joint space.  We injected 0.4 mL of Isovue contrast dye and an arthrogram was seen.  After negative aspiration,  a solution containing 2 mL of 0.5% bupivacaine was injected.  The stylet was re-inserted and the needle was then removed.  There was no sensory or motor deficit in the extremity noted.      After the procedure, the patient's blood pressure, heart rate, oxygen saturation, and VAS were recorded in the chart.  There were no complications.  The patient tolerated the procedure well and was brought to the recovery room for observation in stable condition and discharged with written discharge instructions.     PLAN OF CARE:  The patient is to follow up in the interventional spine clinic in 4-6 weeks.     The patient was advised to contact the interventional spine center for any of the following:    Fever, chills, or night sweats.  New onset severe sharp pain.  Any new upper or lower extremity weakness or numbness.  Any questions regarding the procedure.     If unable to contact the interventional spine center, the patient was instructed to go to the local emergency room.           Estimated blood loss: none or minimal  Specimens: none Patient tolerated the procedure well with no immediate complications. Pressure was applied, and hemostasis was accomplished.

## 2019-08-11 NOTE — Patient Instructions
Discharge Instructions for BILAT SI JOINT INJECTION    Important information following your procedure today: You may drive today    This injection is for diagnostic purposes, it is a test. Only short term results are expected.    1. Though the procedure is generally safe and complications are rare, we do ask that you be aware of any of the following:   ? Any swelling, persistent redness, new bleeding, or drainage from the site of the injection.  ? You should not experience a severe headache.  ? You should not run a fever over 101? F.  ? New onset of sharp, severe back & or neck pain.  ? New onset of upper or lower extremity numbness or weakness.  ? New difficulty controlling bowel or bladder function after the injection.  ? New shortness of breath.    If any of these occur, please call to report this occurrence to a nurse at [phone]. If you are calling after 4:00 p.m. or on weekends or holidays please call [phone] and ask to have the resident physician on call for the physician paged or go to your local emergency room.  2. You may experience soreness at the injection site. Ice can be applied at 20 minute intervals. Avoid application of direct heat, hot showers or hot tubs today.  3. Patients taking a daily blood thinner can resume their regular dose this evening.  4. It is important that you take all medications ordered by your pain physician. Taking medication as ordered is an important part of your pain care plan. If you cannot continue the medication plan, please notify the physician.   5. Remain active today. Do the activities that would normally cause you pain.  6. It is important for you to keep track of the results of this test on paper.  ? Did you get relief?  ? Percentage of relief?  ? How long did it last? Call back to report the results to a nurse on TUESDAY at [phone] . Use notes that you kept when giving your report. You may have to leave a message and a nurse will contact you to help you determine if you are a candidate for the Radiofrequency Ablation Procedure.    The following medications were used: Lidocaine  and Bupivicaine      PAIN DIARY  Please report pain on 0-10 scale for each hour listed following discharge.  (0 = No pain; 5 = Moderate pain; 10 = Worst pain of your life)  TIME DAY OF PROCEDURE LOCATION OF PAIN   Pain Level  Prior to Procedure     9:00 AM     10:00     11:00     12:00 (NOON)     1:00 PM     2:00     3:00     4:00     5:00     6:00     7:00     8:00     9:00     10:00     11:00 PM     12:00 AM (MIDNIGHT)              If you are unable to keep your upcoming appointment, please notify the Spine Center scheduler at [phone] at least 24 hours in advance.

## 2019-08-13 ENCOUNTER — Encounter: Admit: 2019-08-13 | Discharge: 2019-08-13

## 2019-08-13 NOTE — Telephone Encounter
[facility] SP[facility] SI JOINT INJECT ANESTH/STEROID PROC  Location: sacroiliac joint : Bilateral sacroiliac joint.   08/11/2019      Patient reports 70% relief from injection lasting 4 hours time. Please see procedure H&P for further interventions.

## 2019-08-14 ENCOUNTER — Encounter: Admit: 2019-08-14 | Discharge: 2019-08-14

## 2019-08-14 DIAGNOSIS — M533 Sacrococcygeal disorders, not elsewhere classified: Secondary | ICD-10-CM

## 2019-08-14 DIAGNOSIS — M545 Low back pain: Secondary | ICD-10-CM

## 2019-08-14 DIAGNOSIS — M47818 Spondylosis without myelopathy or radiculopathy, sacral and sacrococcygeal region: Secondary | ICD-10-CM

## 2019-08-14 DIAGNOSIS — M461 Sacroiliitis, not elsewhere classified: Secondary | ICD-10-CM

## 2019-09-11 ENCOUNTER — Encounter: Admit: 2019-09-11 | Discharge: 2019-09-11

## 2019-09-11 MED ORDER — ROSUVASTATIN 10 MG PO TAB
10 mg | ORAL_TABLET | Freq: Every day | ORAL | 3 refills | 90.00000 days | Status: AC
Start: 2019-09-11 — End: ?

## 2019-09-15 ENCOUNTER — Encounter: Admit: 2019-09-15 | Discharge: 2019-09-15

## 2019-09-21 ENCOUNTER — Encounter: Admit: 2019-09-21 | Discharge: 2019-09-21

## 2019-09-21 DIAGNOSIS — M461 Sacroiliitis, not elsewhere classified: Secondary | ICD-10-CM

## 2019-09-21 MED FILL — FORTEO 20 MCG/DOSE - 600 MCG/2.4 ML SC PNIJ: 20 mcg/dose (600mcg/2.4mL) | SUBCUTANEOUS | 30 days supply | Qty: 2 | Fill #3 | Status: AC

## 2019-09-30 NOTE — Telephone Encounter
Received vm from [facility] requesting callback, referenced a recent fall said she did not go to the ER, wondering what Dr. Katrinka Blazing would like her to do, has Simplicity RFA scheduled 10/13/19 with Dr. Katrinka Blazing.    RN returned call, left generic message and asked patient to check MyChart for a detailed message or she can call us back, waiting on patient response.

## 2019-10-09 ENCOUNTER — Encounter: Admit: 2019-10-09 | Discharge: 2019-10-09

## 2019-10-09 DIAGNOSIS — M461 Sacroiliitis, not elsewhere classified: Secondary | ICD-10-CM

## 2019-10-13 ENCOUNTER — Ambulatory Visit: Admit: 2019-10-13 | Discharge: 2019-10-13

## 2019-10-13 ENCOUNTER — Encounter: Admit: 2019-10-13 | Discharge: 2019-10-13

## 2019-10-13 ENCOUNTER — Ambulatory Visit: Admit: 2019-10-13 | Discharge: 2019-10-13 | Payer: MEDICARE

## 2019-10-13 DIAGNOSIS — G5600 Carpal tunnel syndrome, unspecified upper limb: Secondary | ICD-10-CM

## 2019-10-13 DIAGNOSIS — Z8679 Personal history of other diseases of the circulatory system: Secondary | ICD-10-CM

## 2019-10-13 DIAGNOSIS — M461 Sacroiliitis, not elsewhere classified: Secondary | ICD-10-CM

## 2019-10-13 DIAGNOSIS — Z86010 Personal history of colonic polyps: Secondary | ICD-10-CM

## 2019-10-13 DIAGNOSIS — J189 Pneumonia, unspecified organism: Secondary | ICD-10-CM

## 2019-10-13 DIAGNOSIS — E785 Hyperlipidemia, unspecified: Secondary | ICD-10-CM

## 2019-10-13 DIAGNOSIS — M47818 Spondylosis without myelopathy or radiculopathy, sacral and sacrococcygeal region: Secondary | ICD-10-CM

## 2019-10-13 DIAGNOSIS — G959 Disease of spinal cord, unspecified: Secondary | ICD-10-CM

## 2019-10-13 DIAGNOSIS — M4802 Spinal stenosis, cervical region: Secondary | ICD-10-CM

## 2019-10-13 DIAGNOSIS — M797 Fibromyalgia: Secondary | ICD-10-CM

## 2019-10-13 DIAGNOSIS — F419 Anxiety disorder, unspecified: Secondary | ICD-10-CM

## 2019-10-13 DIAGNOSIS — I10 Essential (primary) hypertension: Secondary | ICD-10-CM

## 2019-10-13 DIAGNOSIS — K5900 Constipation, unspecified: Secondary | ICD-10-CM

## 2019-10-13 DIAGNOSIS — M5020 Other cervical disc displacement, unspecified cervical region: Secondary | ICD-10-CM

## 2019-10-13 DIAGNOSIS — K219 Gastro-esophageal reflux disease without esophagitis: Secondary | ICD-10-CM

## 2019-10-13 DIAGNOSIS — Z8614 Personal history of Methicillin resistant Staphylococcus aureus infection: Secondary | ICD-10-CM

## 2019-10-13 MED ORDER — KETOROLAC 30 MG/ML (1 ML) IJ SOLN
30 mg | Freq: Once | INTRAVENOUS | 0 refills | Status: CP
Start: 2019-10-13 — End: ?
  Administered 2019-10-13: 18:00:00 30 mg via INTRAVENOUS

## 2019-10-13 MED ORDER — FENTANYL CITRATE (PF) 50 MCG/ML IJ SOLN
25-100 ug | Freq: Once | INTRAVENOUS | 0 refills | Status: CP
Start: 2019-10-13 — End: ?
  Administered 2019-10-13: 17:00:00 100 ug via INTRAVENOUS

## 2019-10-13 MED ORDER — FENTANYL CITRATE (PF) 50 MCG/ML IJ SOLN
50-100 ug | INTRAVENOUS | 0 refills | Status: DC | PRN
Start: 2019-10-13 — End: 2019-10-13
  Administered 2019-10-13 (×2): 50 ug via INTRAVENOUS

## 2019-10-13 MED ORDER — BUPIVACAINE (PF) 0.25 % (2.5 MG/ML) IJ SOLN
10 mL | Freq: Once | INTRAMUSCULAR | 0 refills | Status: CP
Start: 2019-10-13 — End: ?
  Administered 2019-10-13: 18:00:00 10 mL via INTRAMUSCULAR

## 2019-10-13 MED ORDER — MIDAZOLAM 1 MG/ML IJ SOLN
1-5 mg | Freq: Once | INTRAVENOUS | 0 refills | Status: CP
Start: 2019-10-13 — End: ?
  Administered 2019-10-13: 17:00:00 2 mg via INTRAVENOUS

## 2019-10-13 NOTE — Progress Notes
RADIO  FREQUENCY  ABLATION  PROCEDURE    Ground Location: Right Side  ______________________________________  Lead: 1  Location: Right L3  SENSORY STIMULATION  Positive reaction @ na volts  MOTOR STIMULATION TEST  Negative response @ 2.0 volts  ABLATION PROCEDURE  Time: 90 sec  Temperature 80 C  Impedance: 198 Ohms   ___________________________________________  Lead: 2  Location: left L3  SENSORY STIMULATION  Positive reaction @ na volts  MOTOR STIMULATION TEST  Negative response @ 2.0 volts  ABLATION PROCEDURE  Time: 90 sec  Temperature 80 C  Impedance: 220 Ohms  ___________________________________________     BIL:  S1-S4 (SIMP RFA)     Time: 60 sec     Temp: 80 C  ___________________________________________

## 2019-10-13 NOTE — Patient Instructions
Discharge Instructions for Radiofrequency Ablation    Important information following your procedure today: You may NOT drive today    1. Go directly home and rest. You may resume your regular activities and exercise tomorrow.   2. You may experience soreness at the injection site. Apply ice at 20 minute intervals for             the next 24 hours. Avoid application of direct heat, hot showers or hot tubs today.  3. It is not uncommon to experience an increase in pain for several days and up to a week after the procedure.  4. Though the procedure is generally safe and complications are rare, we do ask that you be aware of any of the following:   ? Any swelling, persistent redness, new bleeding, or drainage from the site of the injection.  ? You should not experience a severe headache.  ? You should not run a fever over 101 F.  ? New onset of sharp, severe back & or neck pain.  ? New onset of upper or lower extremity numbness or weakness.  ? New difficulty controlling bowel or bladder function after the injection.  ? New shortness of breath.    If any of these occur, please call to report this occurrence to Dr. Smith at [phone]. If you are calling after 4:00 p.m. or on weekends and holidays please call [phone] and ask to have the resident physician on call for the physician paged or go to your local emergency room.  5. The beneficial effect from the radiofrequency procedure may take several weeks to be demonstrated.  6. Take medications as directed.   7. Please call the nurse at the number listed above with any questions.        The following medications were used: Lidocaine , Bupivicaine  , Versed and Fentanyl    If you are unable to keep your upcoming appointment, please notify the Spine Center scheduler at [phone] at least 24 hours in advance.

## 2019-10-13 NOTE — Progress Notes
Sedation physician present in room.  Recent vitals and patient condition reviewed between sedating physician and nurse.  Reassessment completed.  Determination made to proceed with planned sedation.             Pain Procedure Plan Of Care    Risk of injury related to procedure  Patient identification, allergies verified, fall precautions implemented    Risk of injury and impaired skin integrity  Positioning devices applied as appropriate for procedure, patient transported with staff assistance    Management of Pain  Pain assessment completed on arrival, PAR scoring following procedure and at discharge, sedation administered as ordered, patient positioned for comfort    Risk of anxiety related to procedure and disease process  Patient education on procedure and expectations, provide coping support to patient, provide relaxation techniques    Outcomes:  The patient is free of injury during and following their procedure.  Skin is intact and free of bruising.  The patients pain is managed during their stay.  Alleviation of patient anxiety exhibited.

## 2019-10-13 NOTE — Progress Notes
SPINE CENTER  INTERVENTIONAL PAIN PROCEDURE HISTORY AND PHYSICAL    Chief Complaint   Patient presents with   ? Lower Back - Pain       HISTORY OF PRESENT ILLNESS:  ALLISA JURY is a 59 y.o. year old female who presents for injection.  Denies fevers, chills, or recent hospitalizations.  No allergies to contrast, latex, seafood, iodine, or shellfish.  Patient denies blood thinning medications.        Medical History:   Diagnosis Date   ? Anxiety and depression    ? Carpal tunnel syndrome    ? Cervical myelopathy (HCC)    ? Cervical stenosis of spine    ? Chest pain    ? Constipation    ? Essential hypertension    ? Fibromyalgia    ? GERD (gastroesophageal reflux disease)    ? Herniated disc, cervical    ? History of abnormal electrocardiogram 01/2016   ? History of colon polyps 2015   ? History of MRSA infection 2019    right hand/[facility]   ? Hyperlipemia 08/09/2009   ? Pneumonia        Surgical History:   Procedure Laterality Date   ? HX HYSTERECTOMY  1985   ? UPPER GASTROINTESTINAL ENDOSCOPY  2015   ? COLONOSCOPY  2015    hx colon polyps x2   ? CARDIOVASCULAR STRESS TEST  01/2016   ? FINGER TRIGGER RELEASE Right 2019   ? CARPAL TUNNEL RELEASE Right 2019   ? ANTERIOR CERVICAL DECOMPRESSION AND FUSION AT CERVICAL 6-7 N/A 10/28/2018    Performed by Criss Rosales, MD at [facility] [place]   ? ARTHRODESIS SPINE ANTERIOR INTERBODY WITH DISCECTOMY/ OSTEOPHYTECTOMY/ DECOMPRESSION - CERVICAL BELOW C2 - EACH ADDITIONAL INTERSPACE N/A 10/28/2018    Performed by Criss Rosales, MD at [facility] [place]   ? ANTERIOR INSTRUMENTATION - 2 TO 3 VERTEBRAL SEGMENTS N/A 10/28/2018    Performed by Criss Rosales, MD at [facility] [place]   ? DILATION AND CURETTAGE  1980's       family history includes Cancer in her sister and another family member; Coronary Artery Disease in her father, mother, and other family members; Premature Heart Disease in an other family member; Stroke in an other family member.    Social History     Socioeconomic History ? Marital status: Single     Spouse name: Not on file   ? Number of children: Not on file   ? Years of education: Not on file   ? Highest education level: Not on file   Occupational History   ? Not on file   Tobacco Use   ? Smoking status: Former Smoker     Packs/day: 1.00     Years: 40.00     Pack years: 40.00     Types: Cigarettes     Quit date: 12/18/2017     Years since quitting: 1.8   ? Smokeless tobacco: Never Used   ? Tobacco comment: variable 1-1/2 a day   Substance and Sexual Activity   ? Alcohol use: Yes     Alcohol/week: 0.0 standard drinks     Comment: social   ? Drug use: Yes     Types: Marijuana     Comment: rare for back pain   ? Sexual activity: Not on file   Other Topics Concern   ? Not on file   Social History Narrative   ? Not on file  Allergies   Allergen Reactions   ? Ascorbic Acid ANAPHYLAXIS   ? Erythromycin RASH     Rash on trunk   ? Strawberry UNKNOWN       Vitals:    10/13/19 1038   BP: 121/84   BP Source: Arm, Right Upper   Patient Position: Sitting   Pulse: 68   Resp: 18   Temp: 36.4 ?C (97.6 ?F)   TempSrc: Oral   SpO2: 97%   Weight: 110.2 kg (243 lb)   Height: 170.2 cm (67)   PainSc: Seven       REVIEW OF SYSTEMS: 10 point ROS obtained and negative except for back pain      PHYSICAL EXAM:  General: 59 y.o. female appears stated age, in no acute distress  HEENT: Normocephalic, atraumatic  Neck: No thyroidmegaly  Cardiovascular: Well perfused  Pulmonary: Unlabored respirations  Extremities: No cyanosis, clubbing, or edema  Skin: No lesions seen on exposed skin  Psychiatric:  Appropriate mood and affect  Musculoskeletal: No atrophy.   Neurologic: Antigravity strength in all extremities. CN II -XII grossly intact.  Alert and oriented x 3.         IMPRESSION:    1. Arthropathy of left sacroiliac joint         PLAN:   Bilateral L5-S4 simplicity RFA    General Pre Procedural Sedation ASA Classification I have discussed risks and alternatives of this type of sedation and procedure with: patient    NPO Status:Acceptable    Pregnancy Status: No    Prior Anesthetic Types: Anxiolysis    Patient's had previous experience with anesthesia and/or sedation complications: No    Family history of sedation complications: No    Airway: Airway assessment performed Mallampati  II (soft palate, uvula, fauces visible)    Head and Neck: No abnormalities noted    Mouth: No abnormalities noted    Medications for Procedural Sedation: Midazolam and Fentanyl    Anesthesia Classification:  ASA II (A normal patient with mild systemic disease)    Patient remains a candidate for procedure: Yes    The intention for the procedure today is Anxiolysis/Analgesia.

## 2019-10-13 NOTE — Procedures
INTERVENTIONAL PAIN MANAGEMENT PROCEDURE REPORT    L5-S4 Radiofrequency Ablation    Date of Service: 10/13/2019    Procedure Title(s):    1. Radiofrequency ablation of the bilateral L5 primary dorsal ramus and the S1, S2, S3, and S4 lateral branches   2. Intraoperative fluoroscopy     Attending Surgeon: Curly Rim, MD    Pre-Procedure Diagnosis:   1. Arthropathy of left sacroiliac joint        Post-Procedure Diagnosis:   1. Arthropathy of left sacroiliac joint        Anesthesia: Local                       Anxiolysis Yes 200 mcg fentanyl, 2 mg midazolam           Procedural Sedation No    Indications: Kristen Vaughn is a 59 y.o. female with a diagnosis of lumbar spondylosis. The patient's history and physical exam were reviewed. The patient has failed conservative measures including physical therapy and medication management. On exam the patient exhibits significant tenderness in the above stated levels which is exacerbated by extension and lateral flexion to the painful sides. The patient has had Two medial branch blocks with greater than 75% reduction in pain for the duration of the local anesthetic. The risks, benefits and alternatives to the procedure were discussed, and all questions were answered to the patient's satisfaction. The patient agreed to proceed, and written informed consent was obtained.     Procedure in Detail: IV was started? Yes    The patient was brought into the procedure room and placed in the prone position on the fluoroscopy table. Standard monitors were placed, and vital signs were observed throughout the procedure. The area of the lumbosacral spine was prepped with Hibaclens and draped in a sterile manner. Using AP fluoroscopy, the anatomic position of primary dorsal ramus of L5 bilaterally and the lateral aspect of the right S1, S2, S3, and S4 foramina bilaterally were identified and marked. A 18-gauge, 3.5 inch, 10 mm active tip radiofrequency probe was directed towards the targeted point under fluoroscopy until bone was contacted. Multiple views of AP, lateral, and oblique fluoroscopy were used to confirm correct needle placement. Prior to lesioning, 0.5 mL of 0.25% bupivacaine and 0.5 mL of 1% lidocaine was injected. Lesioning was carried out at 80 degrees Celsius for 90 seconds. After lesioning, the needles were removed, the patient's back cleaned, and bandages placed over the sites of needle insertion.    Disposition: The patient tolerated the procedure well, and there were no apparent complications. Vital signs remained stable througtout the procedure. The patient was taken to the recovery area where discharge instructions for the procedure were given.    Estimated Blood Loss: minimal    Specimens: none    Complications: none

## 2019-10-20 ENCOUNTER — Encounter: Admit: 2019-10-20 | Discharge: 2019-10-20

## 2019-10-21 MED FILL — FORTEO 20 MCG/DOSE - 600 MCG/2.4 ML SC PNIJ: 20 mcg/dose (600mcg/2.4mL) | SUBCUTANEOUS | 28 days supply | Qty: 2 | Fill #4 | Status: AC

## 2019-11-06 ENCOUNTER — Encounter: Admit: 2019-11-06 | Discharge: 2019-11-06

## 2019-11-06 ENCOUNTER — Ambulatory Visit: Admit: 2019-11-06 | Discharge: 2019-11-06 | Payer: MEDICARE

## 2019-11-06 DIAGNOSIS — F419 Anxiety disorder, unspecified: Secondary | ICD-10-CM

## 2019-11-06 DIAGNOSIS — M5020 Other cervical disc displacement, unspecified cervical region: Secondary | ICD-10-CM

## 2019-11-06 DIAGNOSIS — Z8614 Personal history of Methicillin resistant Staphylococcus aureus infection: Secondary | ICD-10-CM

## 2019-11-06 DIAGNOSIS — J189 Pneumonia, unspecified organism: Secondary | ICD-10-CM

## 2019-11-06 DIAGNOSIS — I10 Essential (primary) hypertension: Secondary | ICD-10-CM

## 2019-11-06 DIAGNOSIS — K219 Gastro-esophageal reflux disease without esophagitis: Secondary | ICD-10-CM

## 2019-11-06 DIAGNOSIS — Z8679 Personal history of other diseases of the circulatory system: Secondary | ICD-10-CM

## 2019-11-06 DIAGNOSIS — Z86010 Personal history of colonic polyps: Secondary | ICD-10-CM

## 2019-11-06 DIAGNOSIS — G5600 Carpal tunnel syndrome, unspecified upper limb: Secondary | ICD-10-CM

## 2019-11-06 DIAGNOSIS — M545 Low back pain: Secondary | ICD-10-CM

## 2019-11-06 DIAGNOSIS — G959 Disease of spinal cord, unspecified: Secondary | ICD-10-CM

## 2019-11-06 DIAGNOSIS — M461 Sacroiliitis, not elsewhere classified: Secondary | ICD-10-CM

## 2019-11-06 DIAGNOSIS — K5900 Constipation, unspecified: Secondary | ICD-10-CM

## 2019-11-06 DIAGNOSIS — E785 Hyperlipidemia, unspecified: Secondary | ICD-10-CM

## 2019-11-06 DIAGNOSIS — M4802 Spinal stenosis, cervical region: Secondary | ICD-10-CM

## 2019-11-06 DIAGNOSIS — M47816 Spondylosis without myelopathy or radiculopathy, lumbar region: Secondary | ICD-10-CM

## 2019-11-06 DIAGNOSIS — M797 Fibromyalgia: Secondary | ICD-10-CM

## 2019-11-06 NOTE — Telephone Encounter
First attempt to call patient to get her logged in for her appointment on telehealth. Left message to call if she cannot get logged in.

## 2019-11-06 NOTE — Progress Notes
Obtained patient's verbal consent to treat them and their agreement to [facility] financial policy and NPP via this telehealth visit during the [facility]    SPINE CENTER CLINIC NOTE       SUBJECTIVE:   Kristen Vaughn is a 59 y.o.-year-old female who presents for follow-up after Simplicity radiofrequency ablation on 10/13/19. Patient reports no relief with the procedure. Patient reports her pain is now worse post procedure. Pain was primarily on the left side but now on the right as well and extending into both glutes. She started aquatic therapy 2 weeks ago with some benefit. Her last MRI of lumbar spine was in April 2020.  Pain has since recurred.  She continues to have pain. Pain is worse with flexion and coming back up to neutral position. Pain is worse with sitting. Pain is at preprocedure level. VAS pain score is rated a 6/10. She has been continuing with formal physical therapy. She has not changed any medications. Denies balance difficulties or overt weakness in extremities. Denies recent falls. Denies any loss of control of bowel or bladder.        Review of Systems   Constitutional: Negative.    HENT: Negative.    Eyes: Negative.    Respiratory: Negative.    Cardiovascular: Negative.    Gastrointestinal: Negative.    Endocrine: Negative.    Genitourinary: Negative.    Musculoskeletal: Positive for back pain, myalgias, neck pain and neck stiffness.   Skin: Negative.    Allergic/Immunologic: Negative.    Neurological: Negative.    Hematological: Negative.    Psychiatric/Behavioral: Positive for sleep disturbance.   All other systems reviewed and are negative.      Current Outpatient Medications:   ?  acetaminophen (TYLENOL) 500 mg tablet, Take two tablets by mouth every 6 hours as needed for Pain. Max of 4,000 mg of acetaminophen in 24 hours., Disp: 500 tablet, Rfl: 1  ?  albuterol (PROAIR HFA) 90 mcg/actuation inhaler, Inhale 2 Puffs by mouth into the lungs every 6 hours as needed for Wheezing or Shortness of Breath. Shake well before use., Disp: , Rfl:   ?  AMITR/GABAPEN/EMU OIL 12/26/08% CREAM (COMPOUND), Apply 0.5grams to affected area three times daily PRN for pain, Disp: 50 g, Rfl: 3  ?  busPIRone (BUSPAR) 5 mg tablet, Take 5 mg by mouth daily., Disp: , Rfl:   ?  calcium carbonate (CALCIUM 600 PO), Take 1 tablet by mouth daily., Disp: , Rfl:   ?  celecoxib (CELEBREX) 200 mg capsule, Take 200 mg by mouth daily., Disp: , Rfl:   ?  cetirizine (ZYRTEC) 10 mg tablet, Take 10 mg by mouth daily as needed for Allergy symptoms., Disp: , Rfl:   ?  Cholecalciferol (Vitamin D3) (VITAMIN D-3) 2,000 unit cap, Take two capsules by mouth daily. (Patient taking differently: Take 1 capsule by mouth daily.), Disp: 300 capsule, Rfl: 11  ?  clonazePAM (KLONOPIN) 0.5 mg tablet, Take 0.5 mg by mouth daily as needed., Disp: , Rfl:   ?  diclofenac (VOLTAREN) 1 % topical gel, Apply 4 g topically to affected area four times daily., Disp: , Rfl:   ?  duloxetine DR (CYMBALTA) 60 mg capsule, Take 60 mg by mouth twice daily., Disp: , Rfl:   ?  insulin pen needles (disposable) (BD UF NANO PEN NEEDLES) 32 gauge x 5/32 pen needle, Use one each as directed as Needed. Use with insulin injections., Disp: 300 each, Rfl: 3  ?  insulin  pen needles (disposable) (BD ULTRA-FINE MINI PEN NEEDLE) 31 gauge x 3/16 pen needle, Use 1 each as directed as Needed. Use with Forteo pen., Disp: 300 each, Rfl: 3  ?  lisinopril (PRINIVIL; ZESTRIL) 10 mg tablet, Take 10 mg by mouth daily., Disp: , Rfl:   ?  omeprazole DR(+) (PRILOSEC) 40 mg capsule, Take 40 mg by mouth daily before breakfast., Disp: , Rfl:   ?  pregabalin (LYRICA) 200 mg capsule, Take one capsule by mouth twice daily. (Patient taking differently: Take 200 mg by mouth three times daily.), Disp: 180 capsule, Rfl: 1  ?  QUEtiapine (SEROQUEL) 25 mg tablet, Take 25 mg by mouth at bedtime daily., Disp: , Rfl:   ?  rosuvastatin (CRESTOR) 10 mg tablet, TAKE ONE TABLET BY MOUTH DAILY. INDICATIONS: EXCESSIVE FAT IN THE BLOOD, Disp: 90 tablet, Rfl: 3  ?  teriparatide (FORTEO) 20 mcg/dose (641mcg/2.4mL) pnij injection pen, Inject 20 mcg under the skin daily., Disp: 2.4 mL, Rfl: 3  ?  tiZANidine (ZANAFLEX) 4 mg tablet, Take 4 mg by mouth twice daily., Disp: , Rfl:     Current Facility-Administered Medications:   ?  diazePAM (VALIUM) tablet 5 mg, 5 mg, Oral, PRN, Curly Rim, MD, 5 mg at 04/27/19 1429  Allergies   Allergen Reactions   ? Ascorbic Acid ANAPHYLAXIS   ? Erythromycin RASH     Rash on trunk   ? Strawberry UNKNOWN     Physical Exam  Vitals:    11/06/19 1519   Weight: 110.2 kg (243 lb)   Height: 170.2 cm (67)   PainSc: Six        Pain Score: Six  Body mass index is 38.06 kg/m?Marland Kitchen  General: 59 y.o. female appears stated age, in no acute distress  HEENT: Normocephalic, atraumatic  Neck: No thyroidmegaly  Cardiovascular: Well perfused  Pulmonary: Unlabored respirations  Extremities: No cyanosis, clubbing, or edema  Skin: No lesions seen on exposed skin  Psychiatric:  Appropriate mood and affect  Musculoskeletal: No atrophy. Decreased range of motion with flexion and extension.   Neurologic: Antigravity strength in all extremities. CN II -XII grossly intact.  Alert and oriented x 3. Neural tension signs are negative.       IMPRESSION:  1. Sacroiliitis (HCC)    2. Chronic bilateral low back pain without sciatica    3. Lumbar facet arthropathy          PLAN:    1.  Lifestyle modifications.  Recommend activity as tolerated.  Avoid provocative maneuvers.  Keep spine in neutral position.  2.  Medications.  No changes indicated at this time. May continue with medications as previously prescribed.  3.  Therapy.  Continue with formal physical therapy/aquatic therapy efforts.   4.  Imaging.  May consider updated imaging of lumbar spine with MRI.  5.  Interventions. Will discuss possible additional interventions with Dr. Katrinka Blazing.  6.  Follow-up.  Patient to follow-up as needed.     Todays visit took place via face-to-face encounter utilizing Zoom application. Total time 25 minutes.  Estimated counseling time 20 minutes.  Counseled Ms. Bake regarding treatment options and imaging.

## 2019-11-10 ENCOUNTER — Encounter: Admit: 2019-11-10 | Discharge: 2019-11-10

## 2019-11-10 MED ORDER — FORTEO 20 MCG/DOSE (600MCG/2.4ML) SC PNIJ
20 ug | Freq: Every day | SUBCUTANEOUS | 3 refills | Status: CN
Start: 2019-11-10 — End: ?

## 2019-11-11 ENCOUNTER — Encounter: Admit: 2019-11-11 | Discharge: 2019-11-11

## 2019-11-12 ENCOUNTER — Encounter: Admit: 2019-11-12 | Discharge: 2019-11-12

## 2019-11-12 DIAGNOSIS — M5412 Radiculopathy, cervical region: Secondary | ICD-10-CM

## 2019-11-12 DIAGNOSIS — G959 Disease of spinal cord, unspecified: Secondary | ICD-10-CM

## 2019-11-12 DIAGNOSIS — Z981 Arthrodesis status: Secondary | ICD-10-CM

## 2019-11-12 DIAGNOSIS — M4802 Spinal stenosis, cervical region: Secondary | ICD-10-CM

## 2019-11-13 ENCOUNTER — Encounter: Admit: 2019-11-13 | Discharge: 2019-11-13

## 2019-11-16 ENCOUNTER — Encounter: Admit: 2019-11-16 | Discharge: 2019-11-16

## 2019-11-18 ENCOUNTER — Encounter: Admit: 2019-11-18 | Discharge: 2019-11-18

## 2019-11-19 ENCOUNTER — Encounter: Admit: 2019-11-19 | Discharge: 2019-11-19

## 2019-11-19 NOTE — Telephone Encounter
Tried calling patient x2 today.  Left voicemail messagex2 and informed pt I would try calling again tomorrow 2/26.

## 2019-11-19 NOTE — Telephone Encounter
Pt returned voicemail message.  Updated patient Kristen Vaughn had spoken with Dr Katrinka Blazing and wanted to confirm when bending forward there is pain in her glutes. Pt reports there is pain when doing this.  Suggested caudal epidural & pt reports having those before without relief.  Informed patient I would discuss with [name] tomorrow and call back.  Pt agrees & reports understanding.

## 2019-11-19 NOTE — Telephone Encounter
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    Kristen Vaughn has been on therapy for 96 weeks. The regimen of 20 mcg subcutaneously daily is planned to continue 8-12 weeks with a maximum total lifetime duration of 2 years which is appropriate for Kristen Vaughn. No renal or hepatic adjustments are required. Will transition to anti-resorptive therapy.    At this time the there is no planned dose titration.    Therapeutic Goals and Response to Therapy    Overall fall risk assessment: na  DEXA T-scores / bone mineral density changes: may repeat after completion  DEXA T-scores / bone mineral density changes: 10/14/18  LUMBAR SPINE, L1-L4  Current: 0.866 g/cm2, T-score of -2.7 ????  Previous: 0.755 g/cm2, T-score of -3.5  ?  LEFT FEMORAL NECK ?  Current: 0.799 g/cm2, T-score of -1.7 ????  Previous: 0.768 g/cm2, T-score of -1.9  ?  LEFT TOTAL HIP ?  Current: 0.860 g/cm2, T-score of -1.2   Previous: 0.846 g/cm2, T-score of -1.3  ?  RIGHT FEMORAL NECK  Current: 0.690 g/cm2, T-score of -2.5 ????  Previous: 0.782 g/cm2, T-score of -1.8  ?  RIGHT TOTAL HIP  Current: 0.862 g/cm2, T-score of -1.2   Previous: 0.836 g/cm2, T-score of 1.4 ?  Fracture Risk Assessment: na  Lab Results   Component Value Date    VITD25 25.5 (L) 06/10/2018    CA 9.5 04/02/2019     Ht Readings from Last 3 Encounters:   11/06/19 170.2 cm (67)   10/13/19 170.2 cm (67)   08/11/19 172.7 cm (68)     Wt Readings from Last 3 Encounters:   11/06/19 110.2 kg (243 lb)   10/13/19 110.2 kg (243 lb)   08/11/19 107 kg (236 lb)        As the patient is achieving therapeutic benefit from the therapy the plan is to continue.     Adverse Effects  Kristen Vaughn is not experiencing any significant adverse effects to this medication regimen.    Adherence  Refill and adherence history were reviewed with the patient. The patient is adherent with refills and is meeting their refill goal. They report no missed doses over the past 30 days.  The patient is meeting their adherence goal. The patient was reminded about the refill process and re-educated on the importance of adherence.    Allergies   Allergies   Allergen Reactions   ? Ascorbic Acid ANAPHYLAXIS   ? Erythromycin RASH     Rash on trunk   ? Strawberry UNKNOWN        Vaccination Status Assessment   Immunization History   Administered Date(s) Administered   ? Pneumococcal Vaccine (23-Val Adult) 10/30/2018     Vaccine history was reviewed with the patient. The patient was reminded about the importance of receiving an annual influenza vaccine as indicated.     Medication Reconciliation  A medication history and reconciliation were performed (including prescription medications, supplements, over the counter, and herbal products). The medication list was updated and the patients? current medication list is included below.     Home Medications    Medication Sig   acetaminophen (TYLENOL) 500 mg tablet Take two tablets by mouth every 6 hours as needed for Pain. Max of 4,000 mg of acetaminophen in 24 hours.   albuterol (PROAIR HFA) 90 mcg/actuation inhaler Inhale 2 Puffs  by mouth into the lungs every 6 hours as needed for Wheezing or Shortness of Breath. Shake well before use.   AMITR/GABAPEN/EMU OIL 12/26/08% CREAM (COMPOUND) Apply 0.5grams to affected area three times daily PRN for pain   busPIRone (BUSPAR) 5 mg tablet Take 5 mg by mouth daily.   calcium carbonate (CALCIUM 600 PO) Take 1 tablet by mouth daily.   celecoxib (CELEBREX) 200 mg capsule Take 200 mg by mouth daily.   cetirizine (ZYRTEC) 10 mg tablet Take 10 mg by mouth daily as needed for Allergy symptoms.   Cholecalciferol (Vitamin D3) (VITAMIN D-3) 2,000 unit cap Take two capsules by mouth daily.  Patient taking differently: Take 1 capsule by mouth daily.   clonazePAM (KLONOPIN) 0.5 mg tablet Take 0.5 mg by mouth daily as needed.   diclofenac (VOLTAREN) 1 % topical gel Apply 4 g topically to affected area four times daily.   duloxetine DR (CYMBALTA) 60 mg capsule Take 60 mg by mouth twice daily.   insulin pen needles (disposable) (BD UF NANO PEN NEEDLES) 32 gauge x 5/32 pen needle Use one each as directed as Needed. Use with insulin injections.   insulin pen needles (disposable) (BD ULTRA-FINE MINI PEN NEEDLE) 31 gauge x 3/16 pen needle Use 1 each as directed as Needed. Use with Forteo pen.   lisinopril (PRINIVIL; ZESTRIL) 10 mg tablet Take 10 mg by mouth daily.   omeprazole DR(+) (PRILOSEC) 40 mg capsule Take 40 mg by mouth daily before breakfast.   pregabalin (LYRICA) 200 mg capsule Take one capsule by mouth twice daily.  Patient taking differently: Take 200 mg by mouth three times daily.   QUEtiapine (SEROQUEL) 25 mg tablet Take 25 mg by mouth at bedtime daily.   rosuvastatin (CRESTOR) 10 mg tablet TAKE ONE TABLET BY MOUTH DAILY. INDICATIONS: EXCESSIVE FAT IN THE BLOOD   teriparatide (FORTEO) 20 mcg/dose (640mcg/2.4mL) pnij injection pen Inject 20 mcg under the skin daily.   tiZANidine (ZANAFLEX) 4 mg tablet Take 4 mg by mouth twice daily.        Drug-drug and drug-food interactions between the patients? specialty medication and their medication list were assessed and reviewed with the patient. The patient was instructed to speak with their health care provider before starting any new drugs, including prescription or over the counter, natural / herbal products, or vitamins.    No new significant drug-drug or drug-food interactions were identified.    Their regimen can be taken with or without food.    Pregnancy Status  Pregnancy status was assessed and determined to be: Female, not of child-bearing potential: education not applicable.     Risk Evaluation and Mitigation Strategy (REMS) Assessment  Forteo (teriparatide) REMS program requires a medication guide be dispensed with this medication. A guide will be provided to patient upon dispensing of medication as well as education.    Follow-up Plan   Kristen Vaughn was given the opportunity to ask questions and did not have any questions at this time. The patient was encouraged to call with questions. The patient will be contacted to complete another reassessment within 1 year.     Ladona Mow, PHARMD

## 2019-11-20 ENCOUNTER — Encounter: Admit: 2019-11-20 | Discharge: 2019-11-20

## 2019-11-20 NOTE — Telephone Encounter
Called pt.  Updated pt I had made [name], APRN aware of we had spoke yesterday and were unable to determine if having pain when touching toes and bending forward.  Reported patient feeling sciatica and that pt reported having several epidurals that didn't help.  Updated patient she could continue aquatic therapy and to determine further intervention would need an in person office visit.  Pt agrees with this plan and would like to schedule appt with Dr. Katrinka Vaughn.  Pt scheduled with Dr Kristen Vaughn on 3/8.

## 2019-11-23 ENCOUNTER — Encounter: Admit: 2019-11-23 | Discharge: 2019-11-23

## 2019-11-23 NOTE — Telephone Encounter
Spoke to Evelena Leyden today about plan going forward after completion of current Forteo pen.  Informed that Mariel Sleet, APRN-NP, recommends making appointment with PCP for anti-resorptive therapy. Patient appreciated the call and information.    Ladona Mow, PHARMD

## 2019-11-26 ENCOUNTER — Encounter: Admit: 2019-11-26 | Discharge: 2019-11-26

## 2019-11-26 DIAGNOSIS — M5412 Radiculopathy, cervical region: Secondary | ICD-10-CM

## 2019-11-26 DIAGNOSIS — Z981 Arthrodesis status: Secondary | ICD-10-CM

## 2019-11-26 DIAGNOSIS — G959 Disease of spinal cord, unspecified: Secondary | ICD-10-CM

## 2019-11-26 DIAGNOSIS — M4802 Spinal stenosis, cervical region: Secondary | ICD-10-CM

## 2019-11-26 MED ORDER — FORTEO 20 MCG/DOSE (600MCG/2.4ML) SC PNIJ
20 ug | Freq: Every day | SUBCUTANEOUS | 3 refills | 28.00000 days | Status: DC
Start: 2019-11-26 — End: 2020-02-08

## 2019-11-27 ENCOUNTER — Encounter: Admit: 2019-11-27 | Discharge: 2019-11-27

## 2019-12-02 ENCOUNTER — Encounter: Admit: 2019-12-02 | Discharge: 2019-12-02

## 2019-12-03 ENCOUNTER — Encounter: Admit: 2019-12-03 | Discharge: 2019-12-03

## 2020-01-04 ENCOUNTER — Ambulatory Visit: Admit: 2020-01-04 | Discharge: 2020-01-04 | Payer: MEDICARE

## 2020-01-04 ENCOUNTER — Encounter: Admit: 2020-01-04 | Discharge: 2020-01-04

## 2020-01-04 DIAGNOSIS — J189 Pneumonia, unspecified organism: Secondary | ICD-10-CM

## 2020-01-04 DIAGNOSIS — I10 Essential (primary) hypertension: Secondary | ICD-10-CM

## 2020-01-04 DIAGNOSIS — G5600 Carpal tunnel syndrome, unspecified upper limb: Secondary | ICD-10-CM

## 2020-01-04 DIAGNOSIS — Z8614 Personal history of Methicillin resistant Staphylococcus aureus infection: Secondary | ICD-10-CM

## 2020-01-04 DIAGNOSIS — K5900 Constipation, unspecified: Secondary | ICD-10-CM

## 2020-01-04 DIAGNOSIS — G959 Disease of spinal cord, unspecified: Secondary | ICD-10-CM

## 2020-01-04 DIAGNOSIS — M461 Sacroiliitis, not elsewhere classified: Secondary | ICD-10-CM

## 2020-01-04 DIAGNOSIS — M4802 Spinal stenosis, cervical region: Secondary | ICD-10-CM

## 2020-01-04 DIAGNOSIS — Z86010 Personal history of colonic polyps: Secondary | ICD-10-CM

## 2020-01-04 DIAGNOSIS — M792 Neuralgia and neuritis, unspecified: Secondary | ICD-10-CM

## 2020-01-04 DIAGNOSIS — F419 Anxiety disorder, unspecified: Secondary | ICD-10-CM

## 2020-01-04 DIAGNOSIS — Z8679 Personal history of other diseases of the circulatory system: Secondary | ICD-10-CM

## 2020-01-04 DIAGNOSIS — M797 Fibromyalgia: Secondary | ICD-10-CM

## 2020-01-04 DIAGNOSIS — M5136 Other intervertebral disc degeneration, lumbar region: Secondary | ICD-10-CM

## 2020-01-04 DIAGNOSIS — M47816 Spondylosis without myelopathy or radiculopathy, lumbar region: Secondary | ICD-10-CM

## 2020-01-04 DIAGNOSIS — M5020 Other cervical disc displacement, unspecified cervical region: Secondary | ICD-10-CM

## 2020-01-04 DIAGNOSIS — K219 Gastro-esophageal reflux disease without esophagitis: Secondary | ICD-10-CM

## 2020-01-04 DIAGNOSIS — E785 Hyperlipidemia, unspecified: Secondary | ICD-10-CM

## 2020-01-04 MED ORDER — PREGABALIN 225 MG PO CAP
225 mg | ORAL_CAPSULE | Freq: Two times a day (BID) | ORAL | 5 refills | Status: AC
Start: 2020-01-04 — End: ?

## 2020-01-04 NOTE — Progress Notes
SPINE CENTER CLINIC NOTE       SUBJECTIVE: [name] is a 59 year old female presents today for follow-up of low back and buttock pain.  She was last seen in clinic on 11/06/2019 at which time she was reporting no relief of her back pain after simplicity RFA on 10/13/2019.  At last visit on 11/06/2019 she was continued on current medications and prescribed aquatic therapy.  She reports burning pain in bilateral low back.  This stays mostly in the low back, but there is some pain going down her left posterior leg to her knee - none down to the feet.  No weakness in legs.  No loss of bowel or bladder function.  No falls.           Review of Systems    Current Outpatient Medications:   ?  acetaminophen (TYLENOL) 500 mg tablet, Take two tablets by mouth every 6 hours as needed for Pain. Max of 4,000 mg of acetaminophen in 24 hours., Disp: 500 tablet, Rfl: 1  ?  albuterol (PROAIR HFA) 90 mcg/actuation inhaler, Inhale 2 Puffs by mouth into the lungs every 6 hours as needed for Wheezing or Shortness of Breath. Shake well before use., Disp: , Rfl:   ?  AMITR/GABAPEN/EMU OIL 12/26/08% CREAM (COMPOUND), Apply 0.5grams to affected area three times daily PRN for pain, Disp: 50 g, Rfl: 3  ?  amitriptyline (ELAVIL) 25 mg tablet, Take 25 mg by mouth at bedtime daily., Disp: , Rfl:   ?  busPIRone (BUSPAR) 5 mg tablet, Take 5 mg by mouth daily., Disp: , Rfl:   ?  calcium carbonate (CALCIUM 600 PO), Take 1 tablet by mouth daily., Disp: , Rfl:   ?  celecoxib (CELEBREX) 200 mg capsule, Take 200 mg by mouth daily., Disp: , Rfl:   ?  cetirizine (ZYRTEC) 10 mg tablet, Take 10 mg by mouth daily as needed for Allergy symptoms., Disp: , Rfl:   ?  Cholecalciferol (Vitamin D3) (VITAMIN D-3) 2,000 unit cap, Take two capsules by mouth daily. (Patient taking differently: Take 1 capsule by mouth daily.), Disp: 300 capsule, Rfl: 11  ?  clonazePAM (KLONOPIN) 0.5 mg tablet, Take 0.5 mg by mouth daily as needed., Disp: , Rfl:   ?  diclofenac (VOLTAREN) 1 % topical gel, Apply 4 g topically to affected area four times daily., Disp: , Rfl:   ?  duloxetine DR (CYMBALTA) 60 mg capsule, Take 60 mg by mouth twice daily., Disp: , Rfl:   ?  insulin pen needles (disposable) (BD UF NANO PEN NEEDLES) 32 gauge x 5/32 pen needle, Use one each as directed as Needed. Use with insulin injections., Disp: 300 each, Rfl: 3  ?  insulin pen needles (disposable) (BD ULTRA-FINE MINI PEN NEEDLE) 31 gauge x 3/16 pen needle, Use 1 each as directed as Needed. Use with Forteo pen., Disp: 300 each, Rfl: 3  ?  lisinopril (PRINIVIL; ZESTRIL) 10 mg tablet, Take 10 mg by mouth daily., Disp: , Rfl:   ?  omeprazole DR(+) (PRILOSEC) 40 mg capsule, Take 40 mg by mouth daily before breakfast., Disp: , Rfl:   ?  QUEtiapine (SEROQUEL) 25 mg tablet, Take 25 mg by mouth at bedtime daily., Disp: , Rfl:   ?  rosuvastatin (CRESTOR) 10 mg tablet, TAKE ONE TABLET BY MOUTH DAILY. INDICATIONS: EXCESSIVE FAT IN THE BLOOD, Disp: 90 tablet, Rfl: 3  ?  teriparatide (FORTEO) 20 mcg/dose (662mcg/2.4mL) pnij injection pen, Inject 20 mcg under the skin daily., Disp: 2.4  mL, Rfl: 3    Current Facility-Administered Medications:   ?  diazePAM (VALIUM) tablet 5 mg, 5 mg, Oral, PRN, Curly Rim, MD, 5 mg at 04/27/19 1429     Allergies   Allergen Reactions   ? Ascorbic Acid UNKNOWN   ? Erythromycin RASH     Rash on trunk   ? Strawberry UNKNOWN     Physical Exam  Vitals:    01/04/20 1029   BP: 110/89   BP Source: Arm, Right Upper   Patient Position: Sitting   Pulse: 94   Resp: 20   Temp: 36.2 ?C (97.2 ?F)   TempSrc: Oral   SpO2: 96%   Weight: 110.2 kg (243 lb)   Height: 170.2 cm (67)   PainSc: Seven     Oswestry Total Score:: 56  Pain Score: Seven  Body mass index is 38.06 kg/m?Marland Kitchen    Constitutional: no acute distress, appears stated age  ENMT: supple  Cardiovascular: Well perfused  Respiratory: normal effort with respirations  Gastrointestinal: nondistended  Extremities: No edema  Skin: Warm and dry Psychiatric:  Appropriate mood and affect    Neuro:  MS:   Root Right Left   Hip Flexion L2 5 5   Knee Extension L3 5 5   Dorsiflexion L4 5 5   Plantarflexion S1 5 5   EHL Extension L5 5 5     Sensation: intact to light touch all BLE dermatomes.  Reflexes:  no hyperreflexia bilateral patella and Achilles  Negative Clonus bilaterally    MSK:  Inspection - no atrophy or swelling  Palpation - significantly ttp bilateral L5-S1 facets, a little less so L4-5 facets and PSIS bilaterally.   ROM:  Full ROM with flexion, decreased ROM with extension    Slump test Negative  Facet Loading positive bilaterally    FADIR Negative  FABER positive bilaterally, L > R         IMPRESSION:  1. Lumbar spondylosis    2. Neuropathic pain    3. Lumbar degenerative disc disease    4. Sacroiliitis (HCC)        Kristen Vaughn is a pleasant 59 y.o. female who is here today for follow up of low back pain.  History and examination are consistent with a diagnosis of lumbar spondylosis.    PLAN:    1. Lifestyle Modifications:  Continue activity as tolerated.    2. Medications:  Increased Lyrica dose to 225mg  BID. Continue other medications.  3. Therapy:  No new therapies at this time.  4. Diagnostics:  No new imaging at this time.  5. Interventions: We will schedule the patient for bilateral L4-5 and L5-S1 intra-articular facet steroid injections with 0.5% bupivacaine for diagnostic and therapeutic purposes.  6. Follow up:  Patient will follow up 2 to 3 weeks after procedure.  If pain not improved at that time, we will order MRI L-spine.    The risks and benefits of above medications and interventions was discussed and patient verbalized understanding.  Patient was seen and discussed with Dr. Katrinka Blazing.    Edd Fabian, M.D.  Interventional Spine and Musculoskeletal Medicine Fellow  Physical Medicine and Rehabilitation    Portions of this note may have been created using Dragon, a voice recognition software program.  Please contact my office for clarification on any documentation.    ATTESTATION    I personally performed the key portions of the E/M visit, discussed case with fellow and concur with fellow documentation of history, physical  exam, assessment, and treatment plan unless otherwise noted.    Ms. Mast is a 59 year old female with history of left peroneal neuropathy and cervical fusion, presents for follow-up on low back and gluteal pain.  Patient was last seen on 11/06/2019, at which point she had some persistence of pain after simplicity radiofrequency ablation.  Since that time she has had persistence of symptoms.  She has had increasing burning pain in the area of her low back.  She denies radiation past the knees.  She denies numbness or weakness.    On examination she demonstrates severe tenderness at L5-S1 facet joints greater than L4-L5 facet joints and PSIS.  Facet loading is grossly positive.  She has decreased range of motion with lumbar extension.    Ms. Mikrut is a 59 year old female who presents with increasing axial low back pain status post simplicity radiofrequency ablation.  I recommend for bilateral L4-L5, L5-S1 intra-articular facet injections with dexamethasone and 0.5% bupivacaine for diagnostic and therapeutic benefit.  I recommend increasing Lyrica to 225 mg twice a day.  If pain is improved, would recommend MRI.  We will see her back for procedure.    Staff name: Curly Rim, MD  Date: 01/04/20

## 2020-01-25 ENCOUNTER — Encounter: Admit: 2020-01-25 | Discharge: 2020-01-25

## 2020-01-25 DIAGNOSIS — M47818 Spondylosis without myelopathy or radiculopathy, sacral and sacrococcygeal region: Secondary | ICD-10-CM

## 2020-02-08 ENCOUNTER — Encounter: Admit: 2020-02-08 | Discharge: 2020-02-08

## 2020-02-08 DIAGNOSIS — M47818 Spondylosis without myelopathy or radiculopathy, sacral and sacrococcygeal region: Secondary | ICD-10-CM

## 2020-02-09 ENCOUNTER — Ambulatory Visit: Admit: 2020-02-09 | Discharge: 2020-02-09

## 2020-02-09 ENCOUNTER — Encounter: Admit: 2020-02-09 | Discharge: 2020-02-09

## 2020-02-09 ENCOUNTER — Ambulatory Visit: Admit: 2020-02-09 | Discharge: 2020-02-09 | Payer: MEDICARE

## 2020-02-09 DIAGNOSIS — G959 Disease of spinal cord, unspecified: Secondary | ICD-10-CM

## 2020-02-09 DIAGNOSIS — Z8614 Personal history of Methicillin resistant Staphylococcus aureus infection: Secondary | ICD-10-CM

## 2020-02-09 DIAGNOSIS — K5900 Constipation, unspecified: Secondary | ICD-10-CM

## 2020-02-09 DIAGNOSIS — M47818 Spondylosis without myelopathy or radiculopathy, sacral and sacrococcygeal region: Secondary | ICD-10-CM

## 2020-02-09 DIAGNOSIS — Z8679 Personal history of other diseases of the circulatory system: Secondary | ICD-10-CM

## 2020-02-09 DIAGNOSIS — M4802 Spinal stenosis, cervical region: Secondary | ICD-10-CM

## 2020-02-09 DIAGNOSIS — J189 Pneumonia, unspecified organism: Secondary | ICD-10-CM

## 2020-02-09 DIAGNOSIS — F419 Anxiety disorder, unspecified: Secondary | ICD-10-CM

## 2020-02-09 DIAGNOSIS — E785 Hyperlipidemia, unspecified: Secondary | ICD-10-CM

## 2020-02-09 DIAGNOSIS — K219 Gastro-esophageal reflux disease without esophagitis: Secondary | ICD-10-CM

## 2020-02-09 DIAGNOSIS — M797 Fibromyalgia: Secondary | ICD-10-CM

## 2020-02-09 DIAGNOSIS — Z86010 Personal history of colonic polyps: Secondary | ICD-10-CM

## 2020-02-09 DIAGNOSIS — M5020 Other cervical disc displacement, unspecified cervical region: Secondary | ICD-10-CM

## 2020-02-09 DIAGNOSIS — I10 Essential (primary) hypertension: Secondary | ICD-10-CM

## 2020-02-09 DIAGNOSIS — G5600 Carpal tunnel syndrome, unspecified upper limb: Secondary | ICD-10-CM

## 2020-02-09 DIAGNOSIS — M47816 Spondylosis without myelopathy or radiculopathy, lumbar region: Secondary | ICD-10-CM

## 2020-02-09 MED ORDER — IOPAMIDOL 41 % IT SOLN
2.5 mL | Freq: Once | EPIDURAL | 0 refills | Status: CP
Start: 2020-02-09 — End: ?
  Administered 2020-02-09: 17:00:00 2.5 mL via EPIDURAL

## 2020-02-09 MED ORDER — BUPIVACAINE (PF) 0.25 % (2.5 MG/ML) IJ SOLN
2 mL | Freq: Once | INTRAMUSCULAR | 0 refills | Status: CP
Start: 2020-02-09 — End: ?
  Administered 2020-02-09: 17:00:00 2 mL via INTRAMUSCULAR

## 2020-02-09 MED ORDER — DIAZEPAM 5 MG PO TAB
10 mg | ORAL | 0 refills | Status: DC | PRN
Start: 2020-02-09 — End: 2020-02-09
  Administered 2020-02-09: 17:00:00 10 mg via ORAL

## 2020-02-09 MED ORDER — TRIAMCINOLONE ACETONIDE 40 MG/ML IJ SUSP
80 mg | Freq: Once | EPIDURAL | 0 refills | Status: CP
Start: 2020-02-09 — End: ?
  Administered 2020-02-09: 17:00:00 80 mg via EPIDURAL

## 2020-02-09 NOTE — Progress Notes
SPINE CENTER  INTERVENTIONAL PAIN PROCEDURE HISTORY AND PHYSICAL    Chief Complaint   Patient presents with   ? Procedure       HISTORY OF PRESENT ILLNESS:  Kristen Vaughn is a 59 y.o. year old female who presents for injection.  Denies fevers, chills, or recent hospitalizations.  Patient denies currently taking blood thinning medications.        Medical History:   Diagnosis Date   ? Anxiety and depression    ? Carpal tunnel syndrome    ? Cervical myelopathy (HCC)    ? Cervical stenosis of spine    ? Chest pain    ? Constipation    ? Essential hypertension    ? Fibromyalgia    ? GERD (gastroesophageal reflux disease)    ? Herniated disc, cervical    ? History of abnormal electrocardiogram 01/2016   ? History of colon polyps 2015   ? History of MRSA infection 2019    right hand/[facility]   ? Hyperlipemia 08/09/2009   ? Pneumonia        Surgical History:   Procedure Laterality Date   ? HX HYSTERECTOMY  1985   ? UPPER GASTROINTESTINAL ENDOSCOPY  2015   ? COLONOSCOPY  2015    hx colon polyps x2   ? CARDIOVASCULAR STRESS TEST  01/2016   ? FINGER TRIGGER RELEASE Right 2019   ? CARPAL TUNNEL RELEASE Right 2019   ? ANTERIOR CERVICAL DECOMPRESSION AND FUSION AT CERVICAL 6-7 N/A 10/28/2018    Performed by Criss Rosales, MD at [facility] OR   ? ARTHRODESIS SPINE ANTERIOR INTERBODY WITH DISCECTOMY/ OSTEOPHYTECTOMY/ DECOMPRESSION - CERVICAL BELOW C2 - EACH ADDITIONAL INTERSPACE N/A 10/28/2018    Performed by Criss Rosales, MD at [facility] OR   ? ANTERIOR INSTRUMENTATION - 2 TO 3 VERTEBRAL SEGMENTS N/A 10/28/2018    Performed by Criss Rosales, MD at [facility] OR   ? DILATION AND CURETTAGE  1980's       family history includes Cancer in her sister and another family member; Coronary Artery Disease in her father, mother, and other family members; Premature Heart Disease in an other family member; Stroke in an other family member.    Social History     Socioeconomic History   ? Marital status: Single     Spouse name: Not on file   ? Number of children: Not on file   ? Years of education: Not on file   ? Highest education level: Not on file   Occupational History   ? Not on file   Tobacco Use   ? Smoking status: Former Smoker     Packs/day: 1.00     Years: 40.00     Pack years: 40.00     Types: Cigarettes     Quit date: 12/18/2017     Years since quitting: 2.1   ? Smokeless tobacco: Never Used   ? Tobacco comment: variable 1-1/2 a day   Substance and Sexual Activity   ? Alcohol use: Yes     Alcohol/week: 0.0 standard drinks     Comment: social   ? Drug use: Yes     Types: Marijuana     Comment: rare for back pain   ? Sexual activity: Not on file   Other Topics Concern   ? Not on file   Social History Narrative   ? Not on file       Allergies   Allergen Reactions   ?  Ascorbic Acid UNKNOWN   ? Erythromycin RASH     Rash on trunk   ? Strawberry UNKNOWN       Vitals:    02/09/20 1116   BP: 117/66   Pulse: 72   Resp: 17   Temp: 36.4 ?C (97.6 ?F)   SpO2: 96%   Weight: 104.3 kg (230 lb)   Height: 171.5 cm (67.5)   PainSc: Eight       REVIEW OF SYSTEMS: 10 point ROS obtained and negative except for back pain      PHYSICAL EXAM:  General: 59 y.o. female appears stated age, in no acute distress  HEENT: Normocephalic, atraumatic  Neck: No thyroidmegaly  Cardiovascular: Well perfused  Pulmonary: Unlabored respirations  Extremities: No cyanosis, clubbing, or edema  Skin: No lesions seen on exposed skin  Psychiatric:  Appropriate mood and affect  Musculoskeletal: No atrophy.   Neurologic: Antigravity strength in all extremities. CN II -XII grossly intact.  Alert and oriented x 3.         IMPRESSION:    1. Lumbar spondylosis         PLAN:   Bilateral L4-5, L5-S1 facet injections

## 2020-02-09 NOTE — Patient Instructions
Procedure Completed Today: Facet Joint Injection BILATERAL L4-S1    Important information following your procedure today: You may NOT drive today    1. Pain relief may not be immediate. It is possible you may even experience an increase in pain during the first 24-48 hours followed by a gradual decrease of your pain.  2. Though the procedure is generally safe and complications are rare, we do ask that you be aware of any of the following:   ? Any swelling, persistent redness, new bleeding, or drainage from the site of the injection.  ? You should not experience a severe headache.  ? You should not run a fever over 101? F.  ? New onset of sharp, severe back & or neck pain.  ? New onset of upper or lower extremity numbness or weakness.  ? New difficulty controlling bowel or bladder function after the injection.  ? New shortness of breath.    If any of these occur, please call to report this occurrence to a nurse at [phone]. If you are calling after 4:00 p.m., on weekends or holidays please call [phone] and ask to have the resident physician on call for the physician paged or go to your local emergency room.  3. You may experience soreness at the injection site. Ice can be applied at 20 minute intervals. Avoid application of direct heat, hot showers or hot tubs today.  4. Avoid strenuous activity today. You may resume your regular activities and exercise tomorrow.  5. Patients with diabetes may see an elevation in blood sugars for 7-10 days after the injection. It is important to pay close attention to your diet, check your blood sugars daily and report extreme elevations to the physician that treats your diabetes.  6. Patients taking a daily blood thinner can resume their regular dose this evening.  7. It is important that you take all medications ordered by your pain physician. Taking medication as ordered is an important part of your pain care plan. If you cannot continue the medication plan, please notify the physician.     Possible side effects to steroids that may occur:  ? Flushing or redness of the face  ? Irritability  ? Fluid retention  ? Change in women?s menses    The following medications were used: Lidocaine , Bupivicaine  , Triamcinolone  , Contrast Dye and Others VALIUM 10 MG      Recovery After Procedural Sedation (Adult)  You have been given medicine  to make you sleep during your procedure. This may have included both a pain medicine and sleeping medicine. Most of the effects have worn off. But you may still have some drowsiness for the next 6 to 8 hours.   Home care  Follow these guidelines when you get home:   ? For the next 8 hours, you should be watched by a responsible adult. This person should make sure your condition is not getting worse.  ? Don't drink any alcohol?for the next 24 hours.  ? Don't drive, operate dangerous machinery, or make important business or personal decisions?during the next 24 hours.  Note: Your healthcare provider may tell you not to take any medicine by mouth for pain or sleep in the next 4 hours. These medicines may react with the medicines you were given in the hospital. This could cause a much stronger response than usual.   Follow-up care  Follow up with your healthcare provider if you are not alert and back to your usual  level of activity within 12 hours.   When to seek medical advice  Call your healthcare provider right away if any of these occur:   ? Drowsiness gets worse  ? Weakness or dizziness gets worse  ? Repeated vomiting  ? You can't be awakened  ? Fever  ? New rash  [employer] last reviewed this educational content on 05/25/2018  ? 2000-2021 [employer], [place]. All rights reserved. This information is not intended as a substitute for professional medical care. Always follow your healthcare professional's instructions.

## 2020-02-09 NOTE — Procedures
Attending Surgeon: [name], MD    Anesthesia: Local    Pre-Procedure Diagnosis:   1. Lumbar spondylosis        Post-Procedure Diagnosis:   1. Lumbar spondylosis        [facility] INJECT PVRT FACET MBB JT LMBR/SAC  Procedure: paravertebral facet    Laterality: bilateral    Location: lumbar - L4-5 and L5-S1      Consent:   Consent obtained: verbal and written  Consent given by: patient  Risks discussed: allergic reaction, bleeding, bruising, nerve damage, infection, no change or worsening in pain and reaction to medication    Discussed with patient the purpose of the treatment/procedure, other ways of treating my condition, including no treatment/ procedure and the risks and benefits of the alternatives. Patient has decided to proceed with treatment/procedure.        Universal Protocol:  Relevant documents: relevant documents present and verified  Test results: test results available and properly labeled  Imaging studies: imaging studies available  Required items: required blood products, implants, devices, and special equipment available  Site marked: the operative site was marked  Patient identity confirmed: Patient identify confirmed verbally with patient.        Time out: Immediately prior to procedure a time out was called to verify the correct patient, procedure, equipment, support staff and site/side marked as required      Procedures Details:   Indications: pain and diagnostic evaluation   Prep: Betadine  Patient position: prone  Estimated Blood Loss: minimal  Specimens: none  Number of Levels: 2  Guidance: fluoroscopy  Needle and Epidural Catheter: quincke  Needle size: 25 G  Injection procedure: Negative aspiration for blood  Patient tolerance: Patient tolerated the procedure well with no immediate complications. Pressure was applied, and hemostasis was accomplished.  Comments: DESCRIPTION OF PROCEDURE:  The procedure risks and benefits were explained and informed consent was obtained from the patient.  The patient was placed in prone position on fluoroscopy table.  Blood pressure cuff and oxygen saturation monitors were attached and the patient was monitored throughout the procedure. The skin was prepped using betadine and draped in aseptic fashion.  The C-arm was rotated obliquely towards the right side to visualize the right L4-L5 and L5-S1 facet joints.  The skin and subcutaneous tissue was anesthetized using 2 mL of 1 percent lidocaine with 27-gauge needle.  A 3-1/2 inch 25-gauge spinal needle was advanced parallel to the x-ray beam towards the right L4-L5 and right L5-S1 facet joints.  The tip of the needle made contact with the bone and was slowly walked off and inserted into the facet joint.  After negative aspiration, 0.2 mL of Isovue contrast dye was injected and demonstrated articular flow.  Subsequently, a solution containing. 0.5 mL of 1 percent lidocaine and 20 mg of triamcinolone was injected at both the right L4-L5 and right L5-S1 facet levels.  The stylets were reinserted and the needles were then removed.     Subsequently, attention was taken to the left side, where the left L4-L5 and left L5-S1 facet joints were visualized with fluoroscopy in the oblique view.  The skin and subcutaneous tissue was anesthetized using 2 mL of 1 percent lidocaine with 27-gauge needle.  A 3-1/2 inch 25-gauge spinal needle was advanced parallel to the x-ray beam towards the left L4-L5 and left L5-S1 facet joints.  The needles were advanced slowly until the tip of the needle made contact with the bone and was subsequently  walked off and advanced into the facet joint.  After negative aspiration, 0.2 mL of Isovue contrast dye was injected and arthrogram was seen.  A mixture containing 0.5 mL of 1 percent lidocaine and 20 mg of triamcinolone was injected into the left L4-L5 and left L5-S1 facet joints.  The stylets were reinserted and needles were then removed.  There is no sensory or motor deficit of extremity noted.  After the procedure, the patient's blood pressure, heart rate, and oxygen saturation were recorded in the chart.     There were no complications.  The patient tolerated the procedure well and was brought to the recovery room for observation in stable condition and discharged with written discharge instructions.     PLAN OF CARE:  The patient is to followup with me at first available appointment.     The patient was advised to contact the [facility] for any of the following    Fever chills or night sweats.  New onset of severe sharp pain.  Any new upper or lower extremity weakness or numbness.  Any questions regarding the procedure.    If unable to contact the [facility], the patient instructed to go to the local emergency room.           Estimated blood loss: none or minimal  Specimens: none  Patient tolerated the procedure well with no immediate complications. Pressure was applied, and hemostasis was accomplished.

## 2020-02-09 NOTE — Progress Notes

## 2020-02-23 ENCOUNTER — Encounter: Admit: 2020-02-23 | Discharge: 2020-02-23

## 2020-02-23 ENCOUNTER — Ambulatory Visit: Admit: 2020-02-23 | Discharge: 2020-02-23 | Payer: MEDICARE

## 2020-02-23 DIAGNOSIS — Z86010 Personal history of colonic polyps: Secondary | ICD-10-CM

## 2020-02-23 DIAGNOSIS — M545 Low back pain: Secondary | ICD-10-CM

## 2020-02-23 DIAGNOSIS — J189 Pneumonia, unspecified organism: Secondary | ICD-10-CM

## 2020-02-23 DIAGNOSIS — Z8614 Personal history of Methicillin resistant Staphylococcus aureus infection: Secondary | ICD-10-CM

## 2020-02-23 DIAGNOSIS — I10 Essential (primary) hypertension: Secondary | ICD-10-CM

## 2020-02-23 DIAGNOSIS — Z8679 Personal history of other diseases of the circulatory system: Secondary | ICD-10-CM

## 2020-02-23 DIAGNOSIS — M47816 Spondylosis without myelopathy or radiculopathy, lumbar region: Secondary | ICD-10-CM

## 2020-02-23 DIAGNOSIS — M5020 Other cervical disc displacement, unspecified cervical region: Secondary | ICD-10-CM

## 2020-02-23 DIAGNOSIS — G5600 Carpal tunnel syndrome, unspecified upper limb: Secondary | ICD-10-CM

## 2020-02-23 DIAGNOSIS — G959 Disease of spinal cord, unspecified: Secondary | ICD-10-CM

## 2020-02-23 DIAGNOSIS — M4802 Spinal stenosis, cervical region: Secondary | ICD-10-CM

## 2020-02-23 DIAGNOSIS — M797 Fibromyalgia: Secondary | ICD-10-CM

## 2020-02-23 DIAGNOSIS — K219 Gastro-esophageal reflux disease without esophagitis: Secondary | ICD-10-CM

## 2020-02-23 DIAGNOSIS — E785 Hyperlipidemia, unspecified: Secondary | ICD-10-CM

## 2020-02-23 DIAGNOSIS — M5416 Radiculopathy, lumbar region: Secondary | ICD-10-CM

## 2020-02-23 DIAGNOSIS — F419 Anxiety disorder, unspecified: Secondary | ICD-10-CM

## 2020-02-23 DIAGNOSIS — K5900 Constipation, unspecified: Secondary | ICD-10-CM

## 2020-02-23 NOTE — Patient Instructions
It was a pleasure seeing you in clinic today.    Katyra Tomassetti RN, CNC  Dr. Joshua T. Bunch/Ortho Spine Surgery  [facility]  [facility]  [address]  [place], [place] [address]  Phone: [phone]   Fax [phone]  Scheduling [phone]  [email or web address]

## 2020-02-24 ENCOUNTER — Ambulatory Visit: Admit: 2020-02-24 | Discharge: 2020-02-24 | Payer: MEDICARE

## 2020-02-24 ENCOUNTER — Encounter: Admit: 2020-02-24 | Discharge: 2020-02-24

## 2020-02-24 ENCOUNTER — Ambulatory Visit: Admit: 2020-02-24 | Discharge: 2020-02-24

## 2020-02-24 DIAGNOSIS — G959 Disease of spinal cord, unspecified: Secondary | ICD-10-CM

## 2020-02-24 DIAGNOSIS — Z981 Arthrodesis status: Secondary | ICD-10-CM

## 2020-02-24 DIAGNOSIS — M5412 Radiculopathy, cervical region: Secondary | ICD-10-CM

## 2020-02-24 DIAGNOSIS — M5020 Other cervical disc displacement, unspecified cervical region: Secondary | ICD-10-CM

## 2020-02-24 DIAGNOSIS — Z86010 Personal history of colonic polyps: Secondary | ICD-10-CM

## 2020-02-24 DIAGNOSIS — Z8679 Personal history of other diseases of the circulatory system: Secondary | ICD-10-CM

## 2020-02-24 DIAGNOSIS — Z8614 Personal history of Methicillin resistant Staphylococcus aureus infection: Secondary | ICD-10-CM

## 2020-02-24 DIAGNOSIS — G5600 Carpal tunnel syndrome, unspecified upper limb: Secondary | ICD-10-CM

## 2020-02-24 DIAGNOSIS — E785 Hyperlipidemia, unspecified: Secondary | ICD-10-CM

## 2020-02-24 DIAGNOSIS — K5900 Constipation, unspecified: Secondary | ICD-10-CM

## 2020-02-24 DIAGNOSIS — K219 Gastro-esophageal reflux disease without esophagitis: Secondary | ICD-10-CM

## 2020-02-24 DIAGNOSIS — M797 Fibromyalgia: Secondary | ICD-10-CM

## 2020-02-24 DIAGNOSIS — F419 Anxiety disorder, unspecified: Secondary | ICD-10-CM

## 2020-02-24 DIAGNOSIS — M4802 Spinal stenosis, cervical region: Secondary | ICD-10-CM

## 2020-02-24 DIAGNOSIS — I10 Essential (primary) hypertension: Secondary | ICD-10-CM

## 2020-02-24 DIAGNOSIS — J189 Pneumonia, unspecified organism: Secondary | ICD-10-CM

## 2020-02-24 NOTE — Progress Notes
SPINE CENTER CLINIC NOTE    Subjective     SUBJECTIVE   Kristen Vaughn is a 59 y.o. female who returns for repeat evaluation 16 months s/p ACDF C6-7 on 10/28/2018.  She is overall doing well.  She is not falling anymore, but states she is dropping things all the time, which she attributes mostly to numbness in her fingers.  She is still smoking 3-4 cigarettes daily.  She is having some neck pain when she turns her neck to the left.  Other than this, she says she is better than before surgery.         REVIEW OF SYSTEMS   Review of Systems   Constitutional: Positive for activity change, diaphoresis and fatigue.   HENT: Positive for drooling and rhinorrhea.    Eyes: Positive for photophobia and visual disturbance.   Respiratory: Positive for stridor.    Cardiovascular: Positive for chest pain.   Gastrointestinal: Positive for constipation and rectal pain.   Endocrine: Positive for heat intolerance.   Genitourinary: Positive for decreased urine volume and frequency.   Musculoskeletal: Positive for arthralgias, back pain, gait problem and myalgias.   Allergic/Immunologic: Positive for food allergies.   Neurological: Positive for dizziness, weakness, light-headedness, numbness and headaches.   Psychiatric/Behavioral: Positive for decreased concentration and sleep disturbance. The patient is nervous/anxious.    All other systems reviewed and are negative.      ALLERGIES     Allergies   Allergen Reactions   ? Ascorbic Acid UNKNOWN   ? Erythromycin RASH     Rash on trunk   ? Strawberry UNKNOWN       MEDICATIONS     Current Outpatient Medications on File Prior to Visit   Medication Sig Dispense Refill   ? acetaminophen (TYLENOL) 500 mg tablet Take two tablets by mouth every 6 hours as needed for Pain. Max of 4,000 mg of acetaminophen in 24 hours. 500 tablet 1   ? albuterol (PROAIR HFA) 90 mcg/actuation inhaler Inhale 2 Puffs by mouth into the lungs every 6 hours as needed for Wheezing or Shortness of Breath. Shake well before use.     ? AMITR/GABAPEN/EMU OIL 12/26/08% CREAM (COMPOUND) Apply 0.5grams to affected area three times daily PRN for pain 50 g 3   ? amitriptyline (ELAVIL) 25 mg tablet Take 25 mg by mouth at bedtime daily.     ? aspirin EC 81 mg tablet Take 81 mg by mouth daily. Take with food.     ? busPIRone (BUSPAR) 5 mg tablet Take 5 mg by mouth daily.     ? calcium carbonate (CALCIUM 600 PO) Take 1 tablet by mouth daily.     ? celecoxib (CELEBREX) 200 mg capsule Take 200 mg by mouth daily.     ? cetirizine (ZYRTEC) 10 mg tablet Take 10 mg by mouth daily as needed for Allergy symptoms.     ? Cholecalciferol (Vitamin D3) (VITAMIN D-3) 2,000 unit cap Take two capsules by mouth daily. (Patient taking differently: Take 1 capsule by mouth daily.) 300 capsule 11   ? clonazePAM (KLONOPIN) 0.5 mg tablet Take 0.5 mg by mouth daily as needed.     ? diclofenac (VOLTAREN) 1 % topical gel Apply 4 g topically to affected area four times daily.     ? duloxetine DR (CYMBALTA) 60 mg capsule Take 60 mg by mouth twice daily.     ? insulin pen needles (disposable) (BD UF NANO PEN NEEDLES) 32 gauge x 5/32 pen needle Use  one each as directed as Needed. Use with insulin injections. 300 each 3   ? insulin pen needles (disposable) (BD ULTRA-FINE MINI PEN NEEDLE) 31 gauge x 3/16 pen needle Use 1 each as directed as Needed. Use with Forteo pen. 300 each 3   ? lisinopril (PRINIVIL; ZESTRIL) 10 mg tablet Take 10 mg by mouth daily.     ? omeprazole DR(+) (PRILOSEC) 40 mg capsule Take 40 mg by mouth daily before breakfast.     ? pregabalin (LYRICA) 225 mg capsule Take one capsule by mouth twice daily. 60 capsule 5   ? QUEtiapine (SEROQUEL) 25 mg tablet Take 25 mg by mouth at bedtime daily.     ? rosuvastatin (CRESTOR) 10 mg tablet TAKE ONE TABLET BY MOUTH DAILY. INDICATIONS: EXCESSIVE FAT IN THE BLOOD 90 tablet 3   ? tiZANidine (ZANAFLEX) 4 mg tablet Take 4 mg by mouth every 6 hours as needed.       Current Facility-Administered Medications on File Prior to Visit   Medication Dose Route Frequency Provider Last Rate Last Admin   ? diazePAM (VALIUM) tablet 5 mg  5 mg Oral PRN [name], MD   5 mg at 04/27/19 1429       PHYSICAL EXAM     Vitals:    02/24/20 1213   BP: 134/89   Pulse: 64   Resp: 16   Temp: 36.5 ?C (97.7 ?F)   TempSrc: Oral   SpO2: 99%   Weight: 102.5 kg (226 lb)   Height: 171.5 cm (67.5)   PainSc: Six        Pain Score: Six  Body mass index is 34.87 kg/m?Marland Kitchen    Constitutional: Alert, NAD  Head: Atraumatic  Eyes: EOMI  Respiratory: Unlabored breathing  Cardiovascular: Regular rate  Skin: No rashes or open wounds appreciated on back  Musculoskeletal: Strength stable  Neurologic: Sensation stable    RADIOLOGY     Imaging is reviewed.  There is no evidence of loosening or failure.  Implants are in stable position.  Alignment looks good.         ASSESSMENT / PLAN   Kristen Vaughn is a 59 y.o. female 16 months s/p ACDF C6-7 on 10/28/2018.       We are going to get a CT scan to evaluate for pseudarthrosis or any new pathology in the patient's cervical spine.  We will plan to follow up with her after this study is completed to review.          In the presence of Criss Rosales, MD , I have taken down these notes, [name], Scribe. February 24, 2020 1:05 PM

## 2020-02-25 ENCOUNTER — Encounter: Admit: 2020-02-25 | Discharge: 2020-02-25

## 2020-02-25 NOTE — Telephone Encounter
02/25/2020 3:07 PM   Patient called stating she has had 2 episodes of back pain with radiation to throat. The first was about a month ago, she had back pain which radiated to her neck and one side of her jaw. It lasted about 20 minutes. The second episode was a week ago, she again had the back back radiating to neck and both sides of her jaw. She also had nausea and near syncope. This episode lasted about a half an hour. She will see Dr Avie Arenas on March 03, 2020 in the [facility] at 9:45 AM. If she has pain before this appointment she will go to the ER.  Colvin Caroli, LPN

## 2020-03-01 ENCOUNTER — Encounter: Admit: 2020-03-01 | Discharge: 2020-03-01

## 2020-03-08 ENCOUNTER — Ambulatory Visit: Admit: 2020-03-08 | Discharge: 2020-03-08 | Payer: MEDICARE

## 2020-03-08 ENCOUNTER — Encounter: Admit: 2020-03-08 | Discharge: 2020-03-08

## 2020-03-08 DIAGNOSIS — M545 Low back pain: Secondary | ICD-10-CM

## 2020-03-23 ENCOUNTER — Encounter: Admit: 2020-03-23 | Discharge: 2020-03-23 | Payer: MEDICARE

## 2020-03-23 ENCOUNTER — Ambulatory Visit: Admit: 2020-03-23 | Discharge: 2020-03-23 | Payer: MEDICARE

## 2020-03-23 DIAGNOSIS — M5412 Radiculopathy, cervical region: Secondary | ICD-10-CM

## 2020-03-23 DIAGNOSIS — F419 Anxiety disorder, unspecified: Secondary | ICD-10-CM

## 2020-03-23 DIAGNOSIS — M4802 Spinal stenosis, cervical region: Secondary | ICD-10-CM

## 2020-03-23 DIAGNOSIS — G959 Disease of spinal cord, unspecified: Secondary | ICD-10-CM

## 2020-03-23 DIAGNOSIS — G5600 Carpal tunnel syndrome, unspecified upper limb: Secondary | ICD-10-CM

## 2020-03-23 DIAGNOSIS — L989 Disorder of the skin and subcutaneous tissue, unspecified: Secondary | ICD-10-CM

## 2020-03-23 DIAGNOSIS — Z981 Arthrodesis status: Secondary | ICD-10-CM

## 2020-03-23 DIAGNOSIS — E785 Hyperlipidemia, unspecified: Secondary | ICD-10-CM

## 2020-03-23 DIAGNOSIS — Z8614 Personal history of Methicillin resistant Staphylococcus aureus infection: Secondary | ICD-10-CM

## 2020-03-23 DIAGNOSIS — M797 Fibromyalgia: Secondary | ICD-10-CM

## 2020-03-23 DIAGNOSIS — Z86010 Personal history of colonic polyps: Secondary | ICD-10-CM

## 2020-03-23 DIAGNOSIS — M5416 Radiculopathy, lumbar region: Secondary | ICD-10-CM

## 2020-03-23 DIAGNOSIS — K219 Gastro-esophageal reflux disease without esophagitis: Secondary | ICD-10-CM

## 2020-03-23 DIAGNOSIS — K5900 Constipation, unspecified: Secondary | ICD-10-CM

## 2020-03-23 DIAGNOSIS — Z8679 Personal history of other diseases of the circulatory system: Secondary | ICD-10-CM

## 2020-03-23 DIAGNOSIS — I10 Essential (primary) hypertension: Secondary | ICD-10-CM

## 2020-03-23 DIAGNOSIS — J189 Pneumonia, unspecified organism: Secondary | ICD-10-CM

## 2020-03-23 DIAGNOSIS — M5020 Other cervical disc displacement, unspecified cervical region: Secondary | ICD-10-CM

## 2020-03-23 NOTE — Patient Instructions
Dr. Acquanetta Belling, [phone]

## 2020-03-23 NOTE — Progress Notes
SPINE CENTER CLINIC NOTE       SUBJECTIVE: Kristen Vaughn is 59 year old female with history of ACDF in 2020, presents for follow-up on low back pain.  We last performed paravertebral facet injections on 02/09/2020.  She denied sustained relief with procedure.  She subsequently saw nurse practitioner Suezanne Cheshire and estimated 30 to 40% improvement in pain.  And since then she has had a recurrence of pain.  She has had MRI completed which is detailed below.  Patient continues to have significant pain in the back.  She does have some symptoms extending into the legs.  She has concerns today about multiple skin lesions and excoriations.  She is interested in dermatology referral.         Review of Systems    Current Outpatient Medications:   ?  acetaminophen (TYLENOL) 500 mg tablet, Take two tablets by mouth every 6 hours as needed for Pain. Max of 4,000 mg of acetaminophen in 24 hours., Disp: 500 tablet, Rfl: 1  ?  albuterol (PROAIR HFA) 90 mcg/actuation inhaler, Inhale 2 Puffs by mouth into the lungs every 6 hours as needed for Wheezing or Shortness of Breath. Shake well before use., Disp: , Rfl:   ?  AMITR/GABAPEN/EMU OIL 12/26/08% CREAM (COMPOUND), Apply 0.5grams to affected area three times daily PRN for pain, Disp: 50 g, Rfl: 3  ?  amitriptyline (ELAVIL) 25 mg tablet, Take 25 mg by mouth at bedtime daily., Disp: , Rfl:   ?  aspirin EC 81 mg tablet, Take 81 mg by mouth daily. Take with food., Disp: , Rfl:   ?  busPIRone (BUSPAR) 5 mg tablet, Take 5 mg by mouth daily., Disp: , Rfl:   ?  calcium carbonate (CALCIUM 600 PO), Take 1 tablet by mouth daily., Disp: , Rfl:   ?  celecoxib (CELEBREX) 200 mg capsule, Take 200 mg by mouth daily., Disp: , Rfl:   ?  cetirizine (ZYRTEC) 10 mg tablet, Take 10 mg by mouth daily as needed for Allergy symptoms., Disp: , Rfl:   ?  Cholecalciferol (Vitamin D3) (VITAMIN D-3) 2,000 unit cap, Take two capsules by mouth daily. (Patient taking differently: Take 1 capsule by mouth daily.), Disp: 300 capsule, Rfl: 11  ?  clonazePAM (KLONOPIN) 0.5 mg tablet, Take 0.5 mg by mouth daily as needed., Disp: , Rfl:   ?  diclofenac (VOLTAREN) 1 % topical gel, Apply 4 g topically to affected area four times daily., Disp: , Rfl:   ?  duloxetine DR (CYMBALTA) 60 mg capsule, Take 60 mg by mouth twice daily., Disp: , Rfl:   ?  insulin pen needles (disposable) (BD UF NANO PEN NEEDLES) 32 gauge x 5/32 pen needle, Use one each as directed as Needed. Use with insulin injections., Disp: 300 each, Rfl: 3  ?  insulin pen needles (disposable) (BD ULTRA-FINE MINI PEN NEEDLE) 31 gauge x 3/16 pen needle, Use 1 each as directed as Needed. Use with Forteo pen., Disp: 300 each, Rfl: 3  ?  lisinopril (PRINIVIL; ZESTRIL) 10 mg tablet, Take 10 mg by mouth daily., Disp: , Rfl:   ?  omeprazole DR(+) (PRILOSEC) 40 mg capsule, Take 40 mg by mouth daily before breakfast., Disp: , Rfl:   ?  pregabalin (LYRICA) 225 mg capsule, Take one capsule by mouth twice daily., Disp: 60 capsule, Rfl: 5  ?  QUEtiapine (SEROQUEL) 25 mg tablet, Take 25 mg by mouth at bedtime daily., Disp: , Rfl:   ?  rosuvastatin (CRESTOR) 10 mg tablet, TAKE  ONE TABLET BY MOUTH DAILY. INDICATIONS: EXCESSIVE FAT IN THE BLOOD, Disp: 90 tablet, Rfl: 3  ?  tiZANidine (ZANAFLEX) 4 mg tablet, Take 4 mg by mouth every 6 hours as needed., Disp: , Rfl:     Current Facility-Administered Medications:   ?  diazePAM (VALIUM) tablet 5 mg, 5 mg, Oral, PRN, [name], MD, 5 mg at 04/27/19 1429  Allergies   Allergen Reactions   ? Ascorbic Acid UNKNOWN   ? Erythromycin RASH     Rash on trunk   ? Strawberry UNKNOWN     Physical Exam  Vitals:    03/23/20 1408   BP: 121/88   BP Source: Arm, Right Upper   Patient Position: Sitting   Temp: 36.5 ?C (97.7 ?F)   TempSrc: Oral   Weight: 101.4 kg (223 lb 9.6 oz)   Height: 171.5 cm (67.5)   PainSc: Seven     Oswestry Total Score:: 64  Pain Score: Seven  Body mass index is 34.5 kg/m?Marland Kitchen  General: 59 y.o. female appears stated age, in no acute distress HEENT: Normocephalic, atraumatic  Neck: No thyroidmegaly  Cardiovascular: Well perfused  Pulmonary: Unlabored respirations  Extremities: No cyanosis, clubbing, or edema  Skin: Scattered punctate lesions with areas of central clearing and surrounding erythema  Psychiatric:  Appropriate mood and affect  Musculoskeletal: No atrophy.   Neurologic: Antigravity strength in all extremities. CN II -XII grossly intact.  Alert and oriented x 3.     Diagnostics:  MRI of lumbar spine from 03/08/2020 was personally reviewed demonstrated broad-based disc bulge at L4-L5 with facet arthropathy which results in mild bilateral neuroforaminal stenosis.           IMPRESSION:  1. Skin lesion    2. Lumbar radiculopathy          PLAN:    1.  Lifestyle modification.  Recommend activity as tolerated.  I counseled her to quit smoking.  She voiced understanding.  2.  Medication.  She may continue with Celebrex.  3.  Therapy.  She has undergone physical therapy.  She may continue with home exercise program.  4.  Interventions.  I recommended bilateral L4 transforaminal epidural steroid injection for diagnostic and therapeutic benefit.  If this does not provide any long-lasting therapeutic relief, I recommend high-frequency spinal cord stimulation trial.  5.  Referral.  I will refer patient to dermatology for evaluation.  6.  Follow-up.  Patient is to follow-up for procedure.

## 2020-03-25 ENCOUNTER — Encounter: Admit: 2020-03-25 | Discharge: 2020-03-25 | Payer: MEDICARE

## 2020-03-25 ENCOUNTER — Ambulatory Visit: Admit: 2020-03-25 | Discharge: 2020-03-26 | Payer: MEDICARE

## 2020-03-25 DIAGNOSIS — E785 Hyperlipidemia, unspecified: Secondary | ICD-10-CM

## 2020-03-25 DIAGNOSIS — I10 Essential (primary) hypertension: Secondary | ICD-10-CM

## 2020-03-25 DIAGNOSIS — Z720 Tobacco use: Secondary | ICD-10-CM

## 2020-03-25 DIAGNOSIS — M4802 Spinal stenosis, cervical region: Secondary | ICD-10-CM

## 2020-03-25 DIAGNOSIS — K5900 Constipation, unspecified: Secondary | ICD-10-CM

## 2020-03-25 DIAGNOSIS — K219 Gastro-esophageal reflux disease without esophagitis: Secondary | ICD-10-CM

## 2020-03-25 DIAGNOSIS — J189 Pneumonia, unspecified organism: Secondary | ICD-10-CM

## 2020-03-25 DIAGNOSIS — Z981 Arthrodesis status: Secondary | ICD-10-CM

## 2020-03-25 DIAGNOSIS — Z8614 Personal history of Methicillin resistant Staphylococcus aureus infection: Secondary | ICD-10-CM

## 2020-03-25 DIAGNOSIS — M5020 Other cervical disc displacement, unspecified cervical region: Secondary | ICD-10-CM

## 2020-03-25 DIAGNOSIS — M797 Fibromyalgia: Secondary | ICD-10-CM

## 2020-03-25 DIAGNOSIS — Z8679 Personal history of other diseases of the circulatory system: Secondary | ICD-10-CM

## 2020-03-25 DIAGNOSIS — F419 Anxiety disorder, unspecified: Secondary | ICD-10-CM

## 2020-03-25 DIAGNOSIS — Z86010 Personal history of colonic polyps: Secondary | ICD-10-CM

## 2020-03-25 DIAGNOSIS — G5600 Carpal tunnel syndrome, unspecified upper limb: Secondary | ICD-10-CM

## 2020-03-25 DIAGNOSIS — G959 Disease of spinal cord, unspecified: Secondary | ICD-10-CM

## 2020-03-25 NOTE — Patient Instructions
It was a pleasure seeing you in clinic today.    Tonea Leiphart RN, CNC  Dr. Joshua T. Bunch/Ortho Spine Surgery  [facility]  [facility]  [address]  [place], [place] [address]  Phone: [phone]   Fax [phone]  Scheduling [phone]  [email or web address]    We appreciate the interest in your health.  The physician reviews both the radiology reports and the imaging independently and will contact you if there are any emergent findings.  If you would like, incidental and chronic changes seen in your images can be reviewed with Dr. Bunch at a follow up appointment.  At that appointment, the radiology report and terminology used in that report can also be explained. Please call our Schedule line if you would like to follow up.

## 2020-03-25 NOTE — Progress Notes
SPINE CENTER CLINIC NOTE    Subjective     SUBJECTIVE   Kristen Vaughn is a 59 y.o. female s/p ACDF C6-7 on 10/28/2018.  Patient comes in today and is doing okay.  Her biggest complaint at this point is discomfort when she turns her head to the left.  Were reviewing the CT scan today.         REVIEW OF SYSTEMS   Review of Systems   Musculoskeletal: Positive for back pain.   All other systems reviewed and are negative.      ALLERGIES     Allergies   Allergen Reactions   ? Ascorbic Acid UNKNOWN   ? Erythromycin RASH     Rash on trunk   ? Strawberry UNKNOWN       MEDICATIONS     Current Outpatient Medications on File Prior to Visit   Medication Sig Dispense Refill   ? acetaminophen (TYLENOL) 500 mg tablet Take two tablets by mouth every 6 hours as needed for Pain. Max of 4,000 mg of acetaminophen in 24 hours. 500 tablet 1   ? albuterol (PROAIR HFA) 90 mcg/actuation inhaler Inhale 2 Puffs by mouth into the lungs every 6 hours as needed for Wheezing or Shortness of Breath. Shake well before use.     ? AMITR/GABAPEN/EMU OIL 12/26/08% CREAM (COMPOUND) Apply 0.5grams to affected area three times daily PRN for pain 50 g 3   ? amitriptyline (ELAVIL) 25 mg tablet Take 25 mg by mouth at bedtime daily.     ? aspirin EC 81 mg tablet Take 81 mg by mouth daily. Take with food.     ? busPIRone (BUSPAR) 5 mg tablet Take 5 mg by mouth daily.     ? calcium carbonate (CALCIUM 600 PO) Take 1 tablet by mouth daily.     ? celecoxib (CELEBREX) 200 mg capsule Take 200 mg by mouth daily.     ? cetirizine (ZYRTEC) 10 mg tablet Take 10 mg by mouth daily as needed for Allergy symptoms.     ? Cholecalciferol (Vitamin D3) (VITAMIN D-3) 2,000 unit cap Take two capsules by mouth daily. (Patient taking differently: Take 1 capsule by mouth daily.) 300 capsule 11   ? clonazePAM (KLONOPIN) 0.5 mg tablet Take 0.5 mg by mouth daily as needed.     ? diclofenac (VOLTAREN) 1 % topical gel Apply 4 g topically to affected area four times daily.     ? duloxetine DR (CYMBALTA) 60 mg capsule Take 60 mg by mouth twice daily.     ? insulin pen needles (disposable) (BD UF NANO PEN NEEDLES) 32 gauge x 5/32 pen needle Use one each as directed as Needed. Use with insulin injections. 300 each 3   ? insulin pen needles (disposable) (BD ULTRA-FINE MINI PEN NEEDLE) 31 gauge x 3/16 pen needle Use 1 each as directed as Needed. Use with Forteo pen. 300 each 3   ? lisinopril (PRINIVIL; ZESTRIL) 10 mg tablet Take 10 mg by mouth daily.     ? omeprazole DR(+) (PRILOSEC) 40 mg capsule Take 40 mg by mouth daily before breakfast.     ? pregabalin (LYRICA) 225 mg capsule Take one capsule by mouth twice daily. 60 capsule 5   ? QUEtiapine (SEROQUEL) 25 mg tablet Take 25 mg by mouth at bedtime daily.     ? rosuvastatin (CRESTOR) 10 mg tablet TAKE ONE TABLET BY MOUTH DAILY. INDICATIONS: EXCESSIVE FAT IN THE BLOOD 90 tablet 3   ? tiZANidine (ZANAFLEX)  4 mg tablet Take 4 mg by mouth every 6 hours as needed.       Current Facility-Administered Medications on File Prior to Visit   Medication Dose Route Frequency Provider Last Rate Last Admin   ? diazePAM (VALIUM) tablet 5 mg  5 mg Oral PRN [name], MD   5 mg at 04/27/19 1429       PHYSICAL EXAM     Vitals:    03/25/20 1333   Weight: 101.2 kg (223 lb)   Height: 171.5 cm (67.5)   PainSc: Six        Pain Score: Six  Body mass index is 34.41 kg/m?Marland Kitchen    Constitutional: Alert, NAD  Head: Atraumatic  Eyes: EOMI  Respiratory: Unlabored breathing  Cardiovascular: Regular rate  Skin: No rashes or open wounds appreciated on back  Musculoskeletal: Strength stable  Neurologic: Sensation stable    RADIOLOGY     This largely seems to be healed at this point C6-7.  There appears to be incorporation of the bone graft centrally.  She does not necessarily have a solid fusion of the uncovertebral joints to the side.  With that said I do not see any evidence of any loosening of the screws.  There is no evidence of a vacuum disc.         ASSESSMENT / PLAN   Kristen Vaughn is a 59 y.o. female with s/p ACDF C6-7 on 10/28/2018 with continued tobacco use.      I think that this actually looks rather good I think that she actually probably does have a fusion centrally.  I think it be nice if this is not incorporate even further.  I have again discussed with her the importance of stopping all nicotine use.  Regular referral for physical therapy to see if that helps some with range of motion of her neck is her biggest complaint is pain when she turns her head to the left.  I think if that did not give her adequate relief then she could always consider further injections or other nonsurgical options.  Id be happy to see her back in the future

## 2020-03-30 ENCOUNTER — Encounter: Admit: 2020-03-30 | Discharge: 2020-03-30 | Payer: MEDICARE

## 2020-03-30 ENCOUNTER — Ambulatory Visit: Admit: 2020-03-30 | Discharge: 2020-03-31 | Payer: MEDICARE

## 2020-03-30 DIAGNOSIS — M4802 Spinal stenosis, cervical region: Secondary | ICD-10-CM

## 2020-03-30 DIAGNOSIS — E785 Hyperlipidemia, unspecified: Secondary | ICD-10-CM

## 2020-03-30 DIAGNOSIS — K5900 Constipation, unspecified: Secondary | ICD-10-CM

## 2020-03-30 DIAGNOSIS — B352 Tinea manuum: Secondary | ICD-10-CM

## 2020-03-30 DIAGNOSIS — M5020 Other cervical disc displacement, unspecified cervical region: Secondary | ICD-10-CM

## 2020-03-30 DIAGNOSIS — L281 Prurigo nodularis: Secondary | ICD-10-CM

## 2020-03-30 DIAGNOSIS — I10 Essential (primary) hypertension: Secondary | ICD-10-CM

## 2020-03-30 DIAGNOSIS — Z8679 Personal history of other diseases of the circulatory system: Secondary | ICD-10-CM

## 2020-03-30 DIAGNOSIS — M797 Fibromyalgia: Secondary | ICD-10-CM

## 2020-03-30 DIAGNOSIS — F419 Anxiety disorder, unspecified: Secondary | ICD-10-CM

## 2020-03-30 DIAGNOSIS — B353 Tinea pedis: Secondary | ICD-10-CM

## 2020-03-30 DIAGNOSIS — G5600 Carpal tunnel syndrome, unspecified upper limb: Secondary | ICD-10-CM

## 2020-03-30 DIAGNOSIS — G959 Disease of spinal cord, unspecified: Secondary | ICD-10-CM

## 2020-03-30 DIAGNOSIS — Z8614 Personal history of Methicillin resistant Staphylococcus aureus infection: Secondary | ICD-10-CM

## 2020-03-30 DIAGNOSIS — Z86010 Personal history of colonic polyps: Secondary | ICD-10-CM

## 2020-03-30 DIAGNOSIS — K219 Gastro-esophageal reflux disease without esophagitis: Secondary | ICD-10-CM

## 2020-03-30 DIAGNOSIS — J189 Pneumonia, unspecified organism: Secondary | ICD-10-CM

## 2020-03-30 MED ORDER — KETOCONAZOLE 2 % TP CREA
Freq: Two times a day (BID) | TOPICAL | 11 refills | 30.00000 days | Status: AC
Start: 2020-03-30 — End: ?

## 2020-03-30 MED ORDER — TRIAMCINOLONE ACETONIDE 0.1 % TP OINT
Freq: Two times a day (BID) | TOPICAL | 11 refills | Status: AC
Start: 2020-03-30 — End: ?

## 2020-03-30 MED ORDER — MUPIROCIN 2 % TP OINT
Freq: Two times a day (BID) | TOPICAL | 11 refills | 11.00000 days | Status: AC
Start: 2020-03-30 — End: ?

## 2020-03-30 MED ORDER — TRIAMCINOLONE ACETONIDE 0.1 % TP OINT
Freq: Two times a day (BID) | TOPICAL | 11 refills | Status: DC
Start: 2020-03-30 — End: 2020-03-30

## 2020-03-30 MED ORDER — MUPIROCIN 2 % TP OINT
Freq: Two times a day (BID) | TOPICAL | 11 refills | 11.00000 days | Status: DC
Start: 2020-03-30 — End: 2020-03-30

## 2020-03-30 NOTE — Progress Notes
ATTESTATION    I personally performed the key portions of the E/M visit, discussed case with resident and concur with resident documentation of history, physical exam, assessment, and treatment plan unless otherwise noted.    Staff name:  Darl Pikes, MD Date:  03/30/2020

## 2020-03-30 NOTE — Progress Notes
Date of Service: 03/30/2020    Subjective:             Kristen Vaughn is a 59 y.o. female.    History of Present Illness  NEW PATIENT referred by Dr. Katrinka Blazing    # Pruritic Lesions  - Pt reports pruritic lesions over abdomen, b/l legs attributed to oak mite bites  - Reports excoriated lesions in various stages of healing  - Admits to picking at bumps which impairs healing  - Current treatment: OTC lidocaine patches    # Finger, toenail changes  - Reports peeling of fingertips and green discoloration of nails  - No current treatment    # Patient has a history of brown and tan spots distributed over the head, trunk, arms and legs.    - These have been present for many years.   - There is + history of blistering sunburns.  - None are itching, bleeding, or painful       Review of Systems   Constitutional: Negative for appetite change and unexpected weight change.   Gastrointestinal: Negative for diarrhea, nausea and vomiting.         Objective:         ? acetaminophen (TYLENOL) 500 mg tablet Take two tablets by mouth every 6 hours as needed for Pain. Max of 4,000 mg of acetaminophen in 24 hours.   ? albuterol (PROAIR HFA) 90 mcg/actuation inhaler Inhale 2 Puffs by mouth into the lungs every 6 hours as needed for Wheezing or Shortness of Breath. Shake well before use.   ? AMITR/GABAPEN/EMU OIL 12/26/08% CREAM (COMPOUND) Apply 0.5grams to affected area three times daily PRN for pain   ? amitriptyline (ELAVIL) 25 mg tablet Take 25 mg by mouth at bedtime daily.   ? aspirin EC 81 mg tablet Take 81 mg by mouth daily. Take with food.   ? busPIRone (BUSPAR) 5 mg tablet Take 5 mg by mouth daily.   ? calcium carbonate (CALCIUM 600 PO) Take 1 tablet by mouth daily.   ? celecoxib (CELEBREX) 200 mg capsule Take 200 mg by mouth daily.   ? cetirizine (ZYRTEC) 10 mg tablet Take 10 mg by mouth daily as needed for Allergy symptoms.   ? Cholecalciferol (Vitamin D3) (VITAMIN D-3) 2,000 unit cap Take two capsules by mouth daily. (Patient taking differently: Take 1 capsule by mouth daily.)   ? clonazePAM (KLONOPIN) 0.5 mg tablet Take 0.5 mg by mouth daily as needed.   ? diclofenac (VOLTAREN) 1 % topical gel Apply 4 g topically to affected area four times daily.   ? duloxetine DR (CYMBALTA) 60 mg capsule Take 60 mg by mouth twice daily.   ? insulin pen needles (disposable) (BD UF NANO PEN NEEDLES) 32 gauge x 5/32 pen needle Use one each as directed as Needed. Use with insulin injections.   ? insulin pen needles (disposable) (BD ULTRA-FINE MINI PEN NEEDLE) 31 gauge x 3/16 pen needle Use 1 each as directed as Needed. Use with Forteo pen.   ? lisinopril (PRINIVIL; ZESTRIL) 10 mg tablet Take 10 mg by mouth daily.   ? omeprazole DR(+) (PRILOSEC) 40 mg capsule Take 40 mg by mouth daily before breakfast.   ? pregabalin (LYRICA) 225 mg capsule Take one capsule by mouth twice daily.   ? QUEtiapine (SEROQUEL) 25 mg tablet Take 25 mg by mouth at bedtime daily.   ? rosuvastatin (CRESTOR) 10 mg tablet TAKE ONE TABLET BY MOUTH DAILY. INDICATIONS: EXCESSIVE FAT IN THE BLOOD   ?  tiZANidine (ZANAFLEX) 4 mg tablet Take 4 mg by mouth every 6 hours as needed.     Vitals:    03/30/20 1436   Weight: 101.2 kg (223 lb)   Height: 171.5 cm (67.5)   PainSc: Zero     Body mass index is 34.41 kg/m?Marland Kitchen     Physical Exam  Areas Examined (all normal unless noted below):  Head/Face  Neck  Chest/breasts/axillae  Back  Abdomen  Buttocks/groin/genitalia   R upper ext  L upper ext  R lower ext  L lower ext    Pertinent findings include:  - Excoriated papules, erosions over lower abdomen, lower R shin  - post-inflammatory hyperpigmentation over lower abdomen  - Peeling over fingertips  - Thickened toenails bilaterally  - Multiple brown and tan evenly pigmented macules are distributed over the head, neck, trunk, arms and legs.  All have symmetric similar dermascopic findings with primarily globular and reticular patterns.         Assessment and Plan:  # Prurigo Nodules  - Advised pt to avoid picking lesions  - Start 1:1 mupirocin oint to TAC 0.1% oint. Advised pt to apply ointment, cover lesions x 2-3 weeks until resolution    # Tinea Manus  # Tinea Pedis  - Counseled pt on etiology  - Start ketoconazole cream to hands, feet daily    # Concern for Bugs in Mouth, Feces  - Advised to f/u with PCP for further work up    # Melanocytic nevi  - Will cont to monitor  - RTC for new/changing lesions  - counseled on ABCD's of melanoma and given pamphlet on melanoma  - counseled on sunscreen SPF 30 or greater and wide-brimmed hat use  - recommend vitamin D3 1000-2000 U/day in winter      RTC PRN

## 2020-03-31 DIAGNOSIS — L989 Disorder of the skin and subcutaneous tissue, unspecified: Secondary | ICD-10-CM

## 2020-04-14 ENCOUNTER — Encounter: Admit: 2020-04-14 | Discharge: 2020-04-14 | Payer: MEDICARE

## 2020-04-14 DIAGNOSIS — M47816 Spondylosis without myelopathy or radiculopathy, lumbar region: Secondary | ICD-10-CM

## 2020-05-02 ENCOUNTER — Encounter: Admit: 2020-05-02 | Discharge: 2020-05-02 | Payer: MEDICARE

## 2020-05-03 ENCOUNTER — Encounter: Admit: 2020-05-03 | Discharge: 2020-05-03 | Payer: MEDICARE

## 2020-05-03 ENCOUNTER — Ambulatory Visit: Admit: 2020-05-03 | Discharge: 2020-05-03 | Payer: MEDICARE

## 2020-05-03 DIAGNOSIS — K5900 Constipation, unspecified: Secondary | ICD-10-CM

## 2020-05-03 DIAGNOSIS — J189 Pneumonia, unspecified organism: Secondary | ICD-10-CM

## 2020-05-03 DIAGNOSIS — Z86010 Personal history of colonic polyps: Secondary | ICD-10-CM

## 2020-05-03 DIAGNOSIS — M5020 Other cervical disc displacement, unspecified cervical region: Secondary | ICD-10-CM

## 2020-05-03 DIAGNOSIS — G959 Disease of spinal cord, unspecified: Secondary | ICD-10-CM

## 2020-05-03 DIAGNOSIS — M797 Fibromyalgia: Secondary | ICD-10-CM

## 2020-05-03 DIAGNOSIS — M5416 Radiculopathy, lumbar region: Secondary | ICD-10-CM

## 2020-05-03 DIAGNOSIS — I10 Essential (primary) hypertension: Secondary | ICD-10-CM

## 2020-05-03 DIAGNOSIS — K219 Gastro-esophageal reflux disease without esophagitis: Secondary | ICD-10-CM

## 2020-05-03 DIAGNOSIS — G5600 Carpal tunnel syndrome, unspecified upper limb: Secondary | ICD-10-CM

## 2020-05-03 DIAGNOSIS — Z8679 Personal history of other diseases of the circulatory system: Secondary | ICD-10-CM

## 2020-05-03 DIAGNOSIS — M4802 Spinal stenosis, cervical region: Secondary | ICD-10-CM

## 2020-05-03 DIAGNOSIS — E785 Hyperlipidemia, unspecified: Secondary | ICD-10-CM

## 2020-05-03 DIAGNOSIS — F419 Anxiety disorder, unspecified: Secondary | ICD-10-CM

## 2020-05-03 DIAGNOSIS — Z8614 Personal history of Methicillin resistant Staphylococcus aureus infection: Secondary | ICD-10-CM

## 2020-05-03 MED ORDER — TRIAMCINOLONE ACETONIDE 40 MG/ML IJ SUSP
80 mg | Freq: Once | EPIDURAL | 0 refills | Status: CP
Start: 2020-05-03 — End: ?
  Administered 2020-05-03: 21:00:00 80 mg via EPIDURAL

## 2020-05-03 MED ORDER — DIAZEPAM 5 MG PO TAB
10 mg | ORAL | 0 refills | Status: DC | PRN
Start: 2020-05-03 — End: 2020-05-03

## 2020-05-03 MED ORDER — DIAZEPAM 5 MG PO TAB
5 mg | ORAL | 0 refills | Status: DC | PRN
Start: 2020-05-03 — End: 2020-05-04
  Administered 2020-05-03: 21:00:00 5 mg via ORAL

## 2020-05-03 MED ORDER — IOPAMIDOL 41 % IT SOLN
2.5 mL | Freq: Once | EPIDURAL | 0 refills | Status: CP
Start: 2020-05-03 — End: ?
  Administered 2020-05-03: 21:00:00 2.5 mL via EPIDURAL

## 2020-05-03 NOTE — Patient Instructions
Procedure Completed Today: Lumbar Transforaminal Steroid Injection    Important information following your procedure today: You may drive today    1. Pain relief may not be immediate. It is possible you may even experience an increase in pain during the first 24-48 hours followed by a gradual decrease of your pain.  2. Though the procedure is generally safe and complications are rare, we do ask that you be aware of any of the following:   ? Any swelling, persistent redness, new bleeding, or drainage from the site of the injection.  ? You should not experience a severe headache.  ? You should not run a fever over 101? F.  ? New onset of sharp, severe back & or neck pain.  ? New onset of upper or lower extremity numbness or weakness.  ? New difficulty controlling bowel or bladder function after the injection.  ? New shortness of breath.    If any of these occur, please call to report this occurrence to a nurse at [phone]. If you are calling after 4:00 p.m., on weekends or holidays please call [phone] and ask to have the resident physician on call for the physician paged or go to your local emergency room.  3. You may experience soreness at the injection site. Ice can be applied at 20 minute intervals. Avoid application of direct heat, hot showers or hot tubs today.  4. Avoid strenuous activity today. You may resume your regular activities and exercise tomorrow.  5. Patients with diabetes may see an elevation in blood sugars for 7-10 days after the injection. It is important to pay close attention to your diet, check your blood sugars daily and report extreme elevations to the physician that treats your diabetes.  6. Patients taking a daily blood thinner can resume their regular dose this evening.  7. It is important that you take all medications ordered by your pain physician. Taking medication as ordered is an important part of your pain care plan. If you cannot continue the medication plan, please notify the physician.     Possible side effects to steroids that may occur:  ? Flushing or redness of the face  ? Irritability  ? Fluid retention  ? Change in women?s menses    The following medications were used: Lidocaine , Triamcinolone   and Contrast Dye

## 2020-05-03 NOTE — Progress Notes
SPINE CENTER  INTERVENTIONAL PAIN PROCEDURE HISTORY AND PHYSICAL    Chief Complaint   Patient presents with   ? Procedure       HISTORY OF PRESENT ILLNESS:  Kristen Vaughn is a 59 y.o. year old female who presents for injection.  Denies fevers, chills, or recent hospitalizations.  Patient denies currently taking blood thinning medications.        Medical History:   Diagnosis Date   ? Anxiety and depression    ? Carpal tunnel syndrome    ? Cervical myelopathy (HCC)    ? Cervical stenosis of spine    ? Chest pain    ? Constipation    ? Essential hypertension    ? Fibromyalgia    ? GERD (gastroesophageal reflux disease)    ? Herniated disc, cervical    ? History of abnormal electrocardiogram 01/2016   ? History of colon polyps 2015   ? History of MRSA infection 2019    right hand/[facility]   ? Hyperlipemia 08/09/2009   ? Pneumonia        Surgical History:   Procedure Laterality Date   ? HX HYSTERECTOMY  1985   ? UPPER GASTROINTESTINAL ENDOSCOPY  2015   ? COLONOSCOPY  2015    hx colon polyps x2   ? CARDIOVASCULAR STRESS TEST  01/2016   ? FINGER TRIGGER RELEASE Right 2019   ? CARPAL TUNNEL RELEASE Right 2019   ? ANTERIOR CERVICAL DECOMPRESSION AND FUSION AT CERVICAL 6-7 N/A 10/28/2018    Performed by Criss Rosales, MD at [facility] OR   ? ARTHRODESIS SPINE ANTERIOR INTERBODY WITH DISCECTOMY/ OSTEOPHYTECTOMY/ DECOMPRESSION - CERVICAL BELOW C2 - EACH ADDITIONAL INTERSPACE N/A 10/28/2018    Performed by Criss Rosales, MD at [facility] OR   ? ANTERIOR INSTRUMENTATION - 2 TO 3 VERTEBRAL SEGMENTS N/A 10/28/2018    Performed by Criss Rosales, MD at [facility] OR   ? DILATION AND CURETTAGE  1980's       family history includes Cancer in her sister and another family member; Coronary Artery Disease in her father, mother, and other family members; Premature Heart Disease in an other family member; Stroke in an other family member.    Social History     Socioeconomic History   ? Marital status: Single     Spouse name: Not on file   ? Number of children: Not on file   ? Years of education: Not on file   ? Highest education level: Not on file   Occupational History   ? Not on file   Tobacco Use   ? Smoking status: Former Smoker     Packs/day: 1.00     Years: 40.00     Pack years: 40.00     Types: Cigarettes     Quit date: 12/18/2017     Years since quitting: 2.3   ? Smokeless tobacco: Never Used   ? Tobacco comment: variable 1-1/2 a day   Substance and Sexual Activity   ? Alcohol use: Yes     Alcohol/week: 0.0 standard drinks     Comment: social   ? Drug use: Yes     Types: Marijuana     Comment: rare for back pain   ? Sexual activity: Not on file   Other Topics Concern   ? Not on file   Social History Narrative   ? Not on file       Allergies   Allergen Reactions   ?  Ascorbic Acid UNKNOWN   ? Erythromycin RASH     Rash on trunk   ? Strawberry UNKNOWN       Vitals:    05/03/20 1514   BP: 136/77   Pulse: 74   Resp: 17   SpO2: 97%   Weight: 99.8 kg (220 lb)   Height: 172.7 cm (68)   PainSc: Six       REVIEW OF SYSTEMS: 10 point ROS obtained and negative except for back pain      PHYSICAL EXAM:  General: 59 y.o. female appears stated age, in no acute distress  HEENT: Normocephalic, atraumatic  Neck: No thyroidmegaly  Cardiovascular: Well perfused  Pulmonary: Unlabored respirations  Extremities: No cyanosis, clubbing, or edema  Skin: No lesions seen on exposed skin  Psychiatric:  Appropriate mood and affect  Musculoskeletal: No atrophy.   Neurologic: Antigravity strength in all extremities. CN II -XII grossly intact.  Alert and oriented x 3.         IMPRESSION:    1. Lumbar radiculopathy         PLAN:   Bilateral L4 TFESI

## 2020-05-03 NOTE — Procedures
Attending Surgeon: [name], MD    Anesthesia: Local    Pre-Procedure Diagnosis:   1. Lumbar radiculopathy        Post-Procedure Diagnosis:   1. Lumbar radiculopathy        [facility] INJECT SNRB/TFESI LUMBAR/SACRAL  Procedure: transforaminal epidural    Laterality: left    Location: lumbar - L4-5      Consent:   Consent obtained: verbal and written  Consent given by: patient  Risks discussed: allergic reaction, bleeding, bruising, infection, nerve damage, no change or worsening in pain and reaction to medication    Discussed with patient the purpose of the treatment/procedure, other ways of treating my condition, including no treatment/ procedure and the risks and benefits of the alternatives. Patient has decided to proceed with treatment/procedure.        Universal Protocol:  Relevant documents: relevant documents present and verified  Test results: test results available and properly labeled  Imaging studies: imaging studies available  Required items: required blood products, implants, devices, and special equipment available  Site marked: the operative site was marked  Patient identity confirmed: Patient identify confirmed verbally with patient.        Time out: Immediately prior to procedure a time out was called to verify the correct patient, procedure, equipment, support staff and site/side marked as required      Procedures Details:   Indications: pain and diagnostic evaluation   Prep: chlorhexidine  Sedation: anxiolysis  : Diazepam 5 mg.  Patient position: prone  Estimated Blood Loss: minimal  Specimens: none  Number of Joints: 1  Guidance: fluoroscopy  Contrast: Procedure confirmed with contrast under live fluoroscopy.  Needle and Epidural Catheter: quincke  Needle size: 25 G  Injection procedure: Incremental injection  Patient tolerance: Patient tolerated the procedure well with no immediate complications. Pressure was applied, and hemostasis was accomplished.  Comments: DESCRIPTION OF PROCEDURE:  The procedure risks and benefits were explained to the patient.  Informed consent was obtained.  The patient was placed in the prone position on the fluoroscopy table with a pillow under the abdomen to help reduce lumbar lordosis.  Blood pressure cuff and oxygen saturation monitor were attached and  the patient was monitored throughout the entire procedure.  The L4 vertebral body was identified with the use of fluoroscopy in the AP view; the C-arm was obliqued to obtain a [employer].  The right L4 pedicle was visualized.  The skin was prepped using Chlorhexadine and draped in aseptic fashion.  The C-arm was rotated slightly obliquely towards the left side to visualize the area just below the foramen.  Skin and subcutaneous tissue were anesthetized using 3 mL of 1 percent lidocaine with a 27-gauge, 1-1/2 inch needle.  Next, a 4.69 inch, 25-gauge spinal needle was slowly advanced to the 6 o'clock position to the right  of the L4 pedicle just cephalad to the superior articular process.  The latter part of the needle advancement was performed with the C-arm in the lateral view.  When the needle tip was visualized to be in the right  L4-5 neural foramen, 0.6 mL of contrast dye was injected.  There was spread of dye revealing right L4 nerve root and epidural spread.  After negative aspiration, a 1 mL solution containing 40 mg of Kenalog and 0.5 mL of 1% lidocaine was injected in increments. The stylet was reinserted and then removed.      Attention was taken to the left side where the injection  was performed in similar manner.    There were no complications.  The patient tolerated the procedure well and was brought to the room for observation in stable condition and discharged with written discharge instructions.     PLAN OF CARE:  The patient is to follow up in the interventional spine clinic in 3 weeks.     The patient was advised to contact the interventional spine center for any of the following: Fever, chills, or night sweats.  New onset severe sharp pain.  Any new upper or lower extremity weakness or numbness.  Any questions regarding the procedure.     If unable to contact the interventional spine center, the patient was instructed to go to the local emergency room.           Estimated blood loss: none or minimal  Specimens: none  Patient tolerated the procedure well with no immediate complications. Pressure was applied, and hemostasis was accomplished.

## 2020-05-05 ENCOUNTER — Encounter: Admit: 2020-05-05 | Discharge: 2020-05-05 | Payer: MEDICARE

## 2020-05-20 ENCOUNTER — Encounter: Admit: 2020-05-20 | Discharge: 2020-05-20 | Payer: MEDICARE

## 2020-05-20 ENCOUNTER — Ambulatory Visit: Admit: 2020-05-20 | Discharge: 2020-05-20 | Payer: MEDICARE

## 2020-05-20 DIAGNOSIS — M797 Fibromyalgia: Secondary | ICD-10-CM

## 2020-05-20 DIAGNOSIS — E785 Hyperlipidemia, unspecified: Secondary | ICD-10-CM

## 2020-05-20 DIAGNOSIS — M545 Low back pain: Secondary | ICD-10-CM

## 2020-05-20 DIAGNOSIS — M5020 Other cervical disc displacement, unspecified cervical region: Secondary | ICD-10-CM

## 2020-05-20 DIAGNOSIS — M4802 Spinal stenosis, cervical region: Secondary | ICD-10-CM

## 2020-05-20 DIAGNOSIS — M5416 Radiculopathy, lumbar region: Secondary | ICD-10-CM

## 2020-05-20 DIAGNOSIS — Z8679 Personal history of other diseases of the circulatory system: Secondary | ICD-10-CM

## 2020-05-20 DIAGNOSIS — K5900 Constipation, unspecified: Secondary | ICD-10-CM

## 2020-05-20 DIAGNOSIS — M47816 Spondylosis without myelopathy or radiculopathy, lumbar region: Secondary | ICD-10-CM

## 2020-05-20 DIAGNOSIS — F419 Anxiety disorder, unspecified: Secondary | ICD-10-CM

## 2020-05-20 DIAGNOSIS — K219 Gastro-esophageal reflux disease without esophagitis: Secondary | ICD-10-CM

## 2020-05-20 DIAGNOSIS — Z86010 Personal history of colonic polyps: Secondary | ICD-10-CM

## 2020-05-20 DIAGNOSIS — J189 Pneumonia, unspecified organism: Secondary | ICD-10-CM

## 2020-05-20 DIAGNOSIS — G5600 Carpal tunnel syndrome, unspecified upper limb: Secondary | ICD-10-CM

## 2020-05-20 DIAGNOSIS — I10 Essential (primary) hypertension: Secondary | ICD-10-CM

## 2020-05-20 DIAGNOSIS — G959 Disease of spinal cord, unspecified: Secondary | ICD-10-CM

## 2020-05-20 DIAGNOSIS — Z8614 Personal history of Methicillin resistant Staphylococcus aureus infection: Secondary | ICD-10-CM

## 2020-05-20 NOTE — Progress Notes
SPINE CENTER CLINIC NOTE       SUBJECTIVE:   Kristen Vaughn is a 59 y.o.-year-old female who presents for follow-up after bilateral L4 transforaminal injection on 05/03/20. Patient reports no relief with the injection.  She is interested in additional treatment options at this time.  Patient continues to have significant back pain.  She reports intermittent radiation into the legs.  Patient reports a fall for 5 days ago when she slipped on a grassy hill and fell backwards.  Patient states her pain was at preprocedural level prior to the fall.  Patient has previously underwent bilateral facet injections, simplicity radiofrequency ablation, and SI injections with minimal relief.  She has been compliant with formal physical therapy as well as provider directed home exercises without relief.  She has been on multiple medications that have not changed.  Last MRI of lumbar spine was completed on 03/08/2020.  VAS pain score is rated a 7 out of 10 today.  She denies any loss of control of her bowel or bladder.        Review of Systems    Current Outpatient Medications:   ?  acetaminophen (TYLENOL) 500 mg tablet, Take two tablets by mouth every 6 hours as needed for Pain. Max of 4,000 mg of acetaminophen in 24 hours., Disp: 500 tablet, Rfl: 1  ?  albuterol (PROAIR HFA) 90 mcg/actuation inhaler, Inhale 2 Puffs by mouth into the lungs every 6 hours as needed for Wheezing or Shortness of Breath. Shake well before use., Disp: , Rfl:   ?  AMITR/GABAPEN/EMU OIL 12/26/08% CREAM (COMPOUND), Apply 0.5grams to affected area three times daily PRN for pain, Disp: 50 g, Rfl: 3  ?  amitriptyline (ELAVIL) 25 mg tablet, Take 25 mg by mouth at bedtime daily., Disp: , Rfl:   ?  aspirin EC 81 mg tablet, Take 81 mg by mouth daily. Take with food., Disp: , Rfl:   ?  busPIRone (BUSPAR) 5 mg tablet, Take 5 mg by mouth daily., Disp: , Rfl:   ?  calcium carbonate (CALCIUM 600 PO), Take 1 tablet by mouth daily., Disp: , Rfl:   ?  celecoxib (CELEBREX) 200 mg capsule, Take 200 mg by mouth daily., Disp: , Rfl:   ?  cetirizine (ZYRTEC) 10 mg tablet, Take 10 mg by mouth daily as needed for Allergy symptoms., Disp: , Rfl:   ?  Cholecalciferol (Vitamin D3) (VITAMIN D-3) 2,000 unit cap, Take two capsules by mouth daily. (Patient taking differently: Take 1 capsule by mouth daily.), Disp: 300 capsule, Rfl: 11  ?  clonazePAM (KLONOPIN) 0.5 mg tablet, Take 0.5 mg by mouth daily as needed., Disp: , Rfl:   ?  diclofenac (VOLTAREN) 1 % topical gel, Apply 4 g topically to affected area four times daily., Disp: , Rfl:   ?  duloxetine DR (CYMBALTA) 60 mg capsule, Take 60 mg by mouth twice daily., Disp: , Rfl:   ?  HYDROcodone/acetaminophen (NORCO) 10/325 mg tablet, TAKE 1 TABLET BY MOUTH TWICE A DAY AS NEEDED FOR PAIN, Disp: , Rfl:   ?  insulin pen needles (disposable) (BD UF NANO PEN NEEDLES) 32 gauge x 5/32 pen needle, Use one each as directed as Needed. Use with insulin injections., Disp: 300 each, Rfl: 3  ?  insulin pen needles (disposable) (BD ULTRA-FINE MINI PEN NEEDLE) 31 gauge x 3/16 pen needle, Use 1 each as directed as Needed. Use with Forteo pen., Disp: 300 each, Rfl: 3  ?  ketoconazole (NIZORAL) 2 %  topical cream, Apply  topically to affected area twice daily., Disp: 60 g, Rfl: 11  ?  lisinopril (PRINIVIL; ZESTRIL) 10 mg tablet, Take 10 mg by mouth daily., Disp: , Rfl:   ?  mupirocin (BACTROBAN) 2 % topical ointment, Apply  topically to affected area twice daily., Disp: 22 g, Rfl: 11  ?  omeprazole DR(+) (PRILOSEC) 40 mg capsule, Take 40 mg by mouth daily before breakfast., Disp: , Rfl:   ?  pregabalin (LYRICA) 225 mg capsule, Take one capsule by mouth twice daily., Disp: 60 capsule, Rfl: 5  ?  QUEtiapine (SEROQUEL) 25 mg tablet, Take 25 mg by mouth at bedtime daily., Disp: , Rfl:   ?  rosuvastatin (CRESTOR) 10 mg tablet, TAKE ONE TABLET BY MOUTH DAILY. INDICATIONS: EXCESSIVE FAT IN THE BLOOD, Disp: 90 tablet, Rfl: 3  ?  tiZANidine (ZANAFLEX) 4 mg tablet, Take 4 mg by mouth every 6 hours as needed., Disp: , Rfl:   ?  triamcinolone acetonide (KENALOG) 0.1 % topical ointment, Apply  topically to affected area twice daily. Mix triamcinolone ointment in a 1:1 ratio with mupirocin. Apply to lesions daily and cover for 2-3 weeks until resolved, Disp: 15 g, Rfl: 11    Current Facility-Administered Medications:   ?  diazePAM (VALIUM) tablet 5 mg, 5 mg, Oral, PRN, [name], MD, 5 mg at 04/27/19 1429  Allergies   Allergen Reactions   ? Ascorbic Acid UNKNOWN   ? Erythromycin RASH     Rash on trunk   ? Strawberry UNKNOWN     Physical Exam  Vitals:    05/20/20 1358   BP: 120/83   BP Source: Arm, Right Upper   Patient Position: Sitting   Pulse: 92   Resp: 20   Temp: 36.6 ?C (97.9 ?F)   TempSrc: Oral   SpO2: 97%   Weight: 97.7 kg (215 lb 6.4 oz)   Height: 171.5 cm (67.5)   PainSc: Seven        Pain Score: Seven  Body mass index is 33.24 kg/m?Marland Kitchen  General: 59 y.o. female appears stated age, in no acute distress  HEENT: Normocephalic, atraumatic  Neck: No thyroidmegaly  Cardiovascular: Well perfused  Pulmonary: Unlabored respirations  Extremities: No cyanosis, clubbing, or edema  Skin: Warm and dry  Psychiatric:  Appropriate mood and affect  Musculoskeletal: Decreased range of motion with lumbar flexion, extension, and lateral rotation. Significantly tender to palpation at L3-L4, L4-L5 and L5-S1 facets. Facet loading is uncomfortable bilaterally.   Neurologic: Lower extremity myotomes are all 5/5. Decreased sensation right L4 and L5 dermatomal distribution otherwise, Lower extremity dermatomes are all intact to light touch. Deep tendon reflexes are symmetric at patella and achilles. Negative slump test bilaterally.   Downward Babinski. No ankle clonus.        IMPRESSION:  1. Chronic bilateral low back pain without sciatica    2. Lumbar spondylosis    3. Lumbar radiculopathy      PLAN:    1.  Lifestyle modifications.  Recommend activity as tolerated.  Avoid provocative maneuvers.  Keep spine in neutral position.  2.  Medications.  No changes indicated at this time.  Continue with medications as previously prescribed.  3.  Therapy.  She has undergone physical therapy as well as provider directed home exercises.  She may continue with home exercises at this time.  4.  Imaging.  None indicated at this time.  5.  Interventions.  Patient has elected to proceed with spinal cord stimulator  trial at this time.  Placed referral for trial and psychiatric evaluation.  Discussed risks of the procedure including pain, bleeding, infection, and damage to nearby structures.  Patient has elected to proceed with trial at this time.  6.  Follow-up.  Patient to follow-up for trial.

## 2020-05-22 IMAGING — CR CHEST
2 series · 2 of 2 positions shown · non-contrast
Comparison: none

[chest pa x-wise]
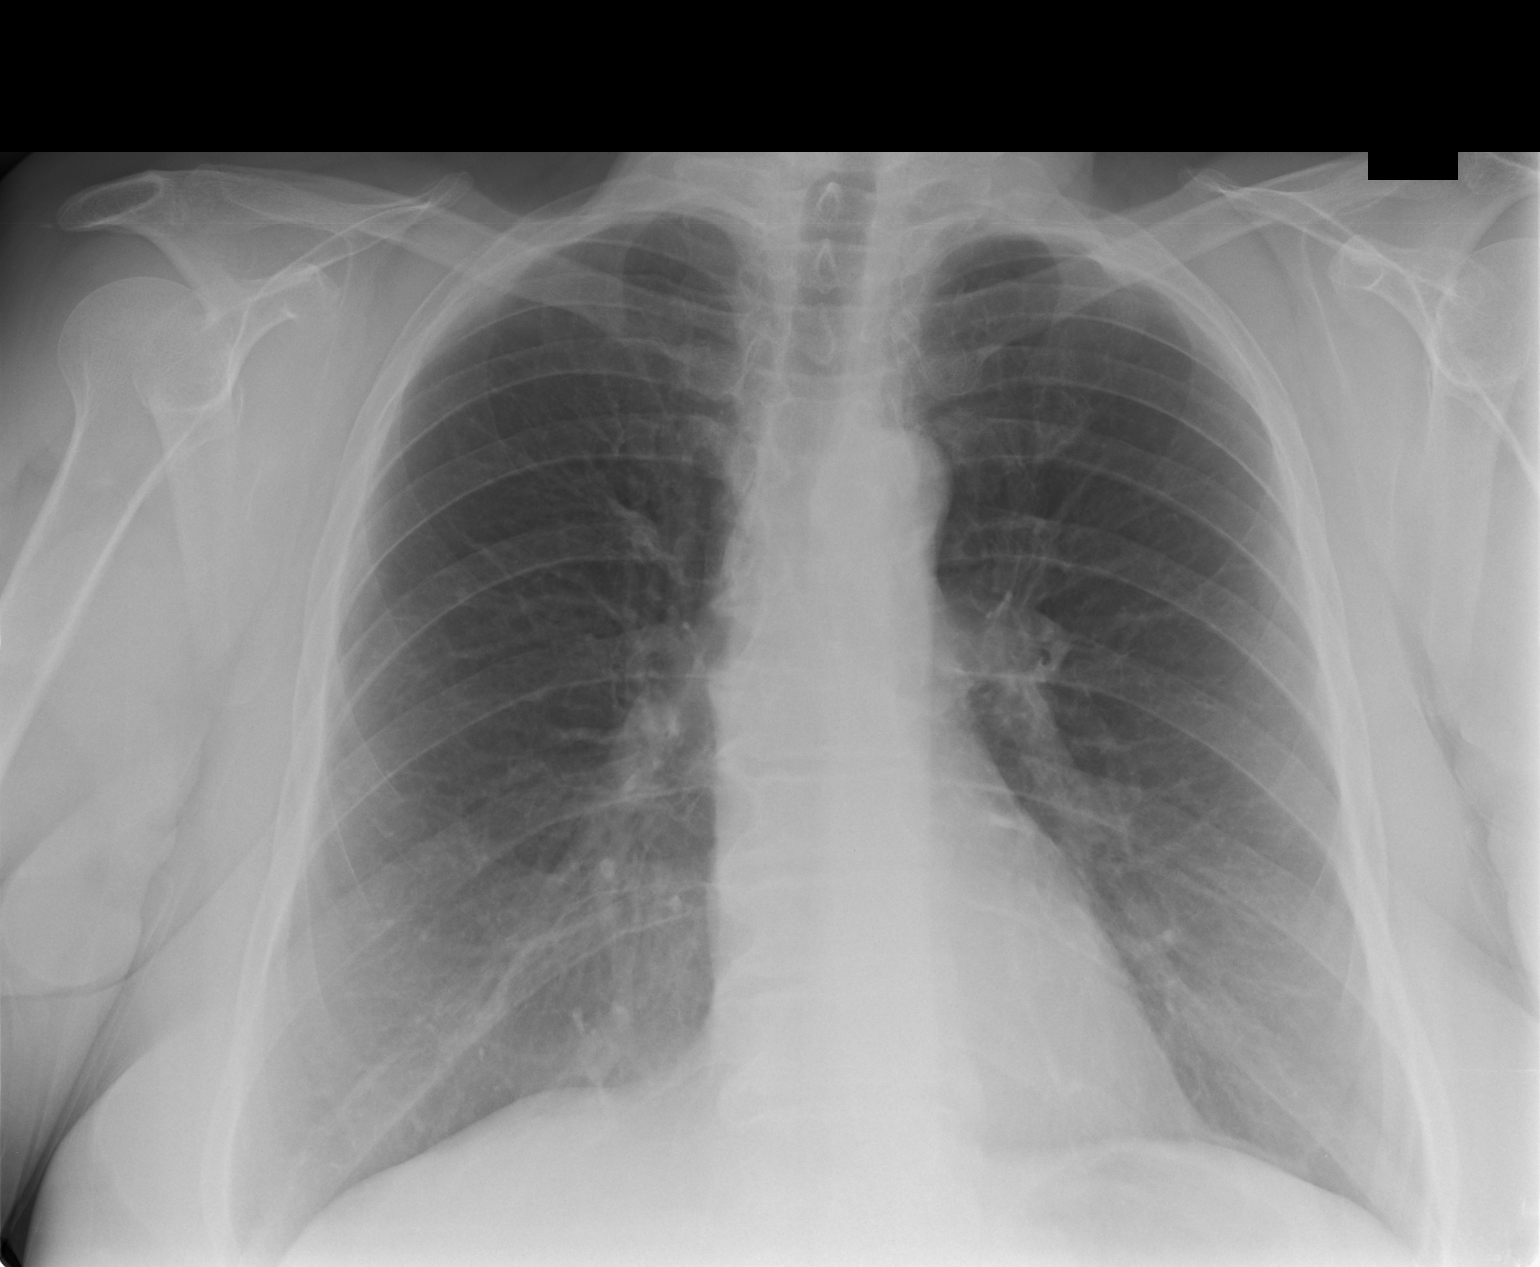

[chest lat]
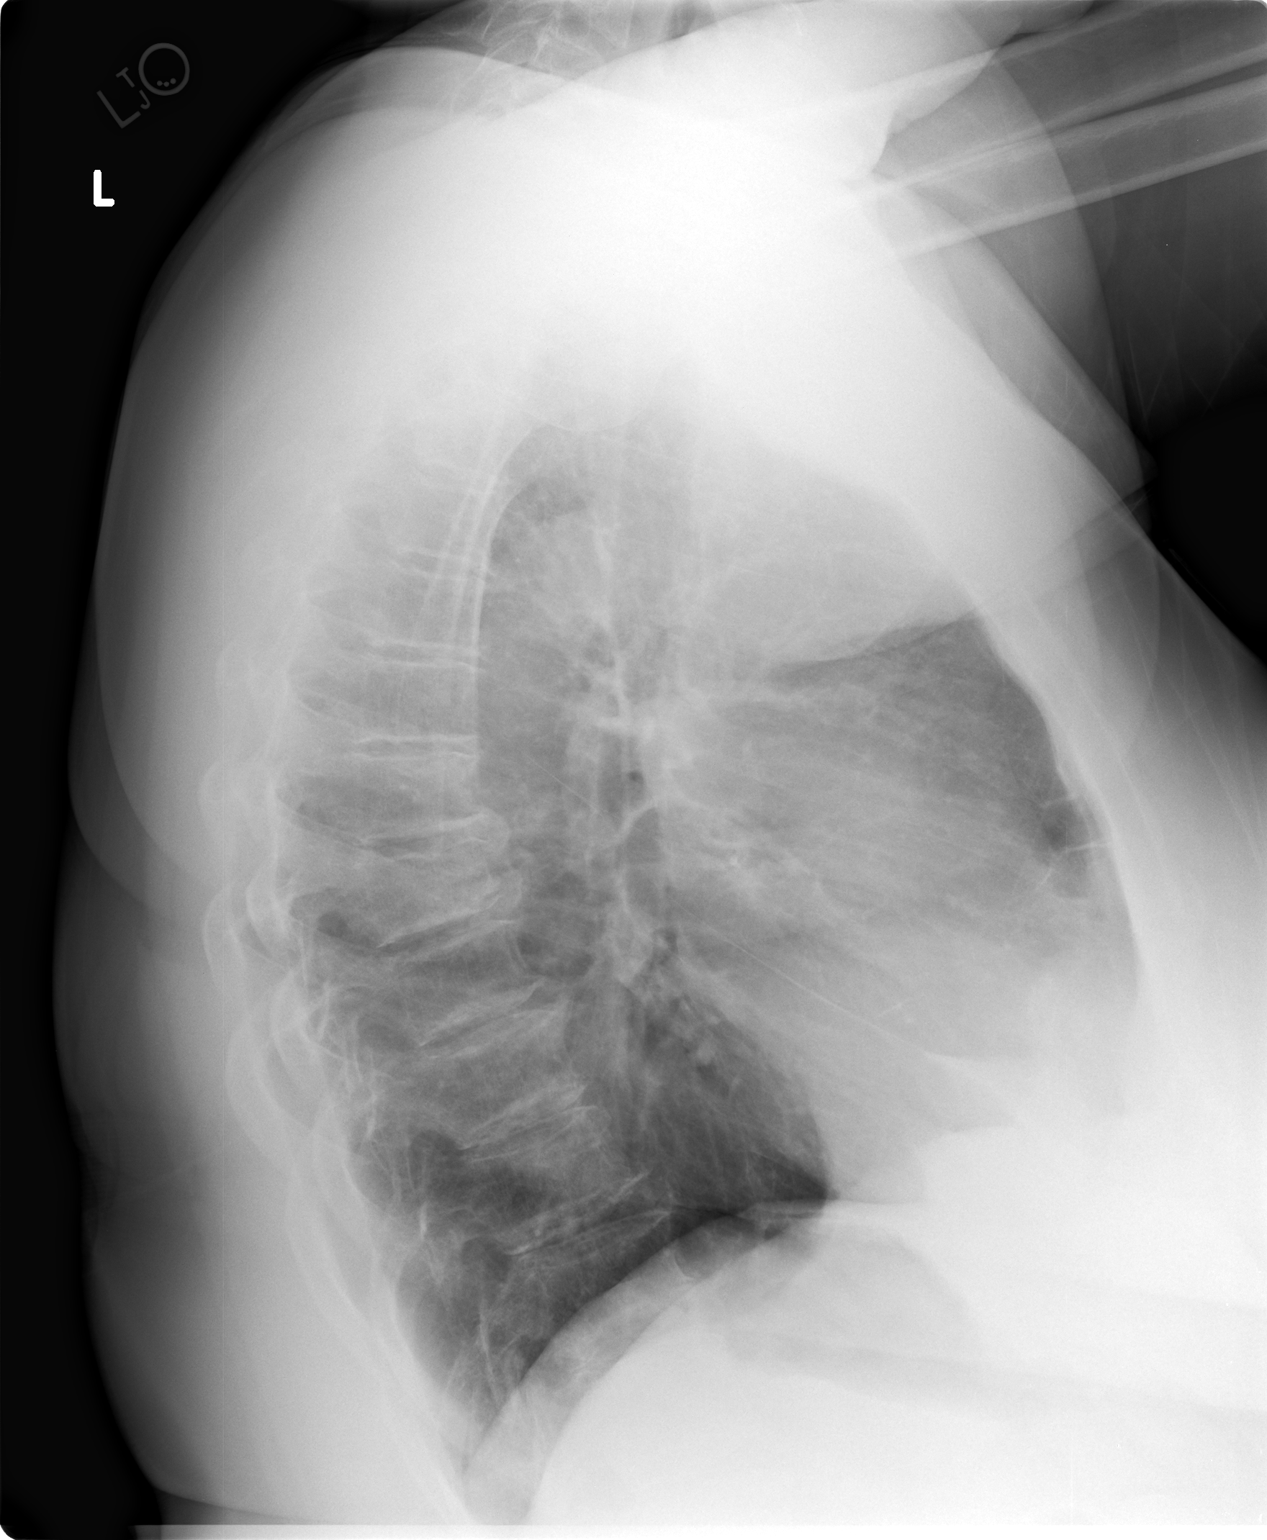

[2 of 2 positions shown; findings below may reference images not displayed]

EXAM

XR chest 2V

INDICATION

Chest pain

TECHNIQUE

PA and Lateral views of the chest

COMPARISONS

None available at the time of dictation.

FINDINGS

No radiographically apparent pleural effusion, consolidation, or pneumothorax.

The cardiomediastinal silhouette is normal in size.

The osseous structures are without an acute osseous abnormality. There is age-indeterminate
superior endplate irregularity of presumably of the T8 vertebra.

IMPRESSION
1. No radiographic evidence of an acute cardiopulmonary process.

Tech Notes:

PT C/O COUGH, SOA. TJ

## 2020-06-03 ENCOUNTER — Encounter: Admit: 2020-06-03 | Discharge: 2020-06-03 | Payer: MEDICARE

## 2020-07-25 ENCOUNTER — Encounter: Admit: 2020-07-25 | Discharge: 2020-07-25 | Payer: MEDICARE

## 2020-07-25 DIAGNOSIS — M792 Neuralgia and neuritis, unspecified: Secondary | ICD-10-CM

## 2020-07-25 MED ORDER — PREGABALIN 225 MG PO CAP
ORAL_CAPSULE | Freq: Two times a day (BID) | 0 refills
Start: 2020-07-25 — End: ?

## 2020-07-28 ENCOUNTER — Ambulatory Visit: Admit: 2020-07-28 | Discharge: 2020-07-28 | Payer: MEDICARE

## 2020-07-28 ENCOUNTER — Encounter: Admit: 2020-07-28 | Discharge: 2020-07-28 | Payer: MEDICARE

## 2020-08-01 ENCOUNTER — Encounter: Admit: 2020-08-01 | Discharge: 2020-08-01 | Payer: MEDICARE

## 2020-08-01 NOTE — Telephone Encounter
Pt called wanting to change her appointment tomorrow to tele health. I advised that we could do this. All questions addressed and patient to call [phone] if there are further concerns.

## 2020-08-02 ENCOUNTER — Encounter: Admit: 2020-08-02 | Discharge: 2020-08-02 | Payer: MEDICARE

## 2020-08-02 ENCOUNTER — Ambulatory Visit: Admit: 2020-08-02 | Discharge: 2020-08-03 | Payer: MEDICARE

## 2020-08-02 DIAGNOSIS — Z8249 Family history of ischemic heart disease and other diseases of the circulatory system: Secondary | ICD-10-CM

## 2020-08-02 DIAGNOSIS — Z86010 Personal history of colonic polyps: Secondary | ICD-10-CM

## 2020-08-02 DIAGNOSIS — I10 Essential (primary) hypertension: Secondary | ICD-10-CM

## 2020-08-02 DIAGNOSIS — E785 Hyperlipidemia, unspecified: Secondary | ICD-10-CM

## 2020-08-02 DIAGNOSIS — E6609 Other obesity due to excess calories: Secondary | ICD-10-CM

## 2020-08-02 DIAGNOSIS — E782 Mixed hyperlipidemia: Secondary | ICD-10-CM

## 2020-08-02 DIAGNOSIS — M4802 Spinal stenosis, cervical region: Secondary | ICD-10-CM

## 2020-08-02 DIAGNOSIS — M5020 Other cervical disc displacement, unspecified cervical region: Secondary | ICD-10-CM

## 2020-08-02 DIAGNOSIS — R0789 Other chest pain: Secondary | ICD-10-CM

## 2020-08-02 DIAGNOSIS — G959 Disease of spinal cord, unspecified: Secondary | ICD-10-CM

## 2020-08-02 DIAGNOSIS — K219 Gastro-esophageal reflux disease without esophagitis: Secondary | ICD-10-CM

## 2020-08-02 DIAGNOSIS — Z8614 Personal history of Methicillin resistant Staphylococcus aureus infection: Secondary | ICD-10-CM

## 2020-08-02 DIAGNOSIS — K5900 Constipation, unspecified: Secondary | ICD-10-CM

## 2020-08-02 DIAGNOSIS — J189 Pneumonia, unspecified organism: Secondary | ICD-10-CM

## 2020-08-02 DIAGNOSIS — Z8679 Personal history of other diseases of the circulatory system: Secondary | ICD-10-CM

## 2020-08-02 DIAGNOSIS — R079 Chest pain, unspecified: Secondary | ICD-10-CM

## 2020-08-02 DIAGNOSIS — M797 Fibromyalgia: Secondary | ICD-10-CM

## 2020-08-02 DIAGNOSIS — F17200 Nicotine dependence, unspecified, uncomplicated: Secondary | ICD-10-CM

## 2020-08-02 DIAGNOSIS — G5600 Carpal tunnel syndrome, unspecified upper limb: Secondary | ICD-10-CM

## 2020-08-02 DIAGNOSIS — R6884 Jaw pain: Secondary | ICD-10-CM

## 2020-08-02 DIAGNOSIS — F419 Anxiety disorder, unspecified: Secondary | ICD-10-CM

## 2020-08-02 NOTE — Progress Notes
Obtained patient's, or patient proxy's, verbal consent to treat them and their agreement to [facility] financial policy and NPP via this telehealth visit during the [facility] Emergency    ate of Service: 08/02/2020    Kristen Vaughn is a 59 y.o. female.       HPI     [name] was evaluated through our office today via a video telehealth visit due to COVID-19 pandemic..  The patient is a 59 year old female followed in office by Dr. Nickolas Madrid.  She has a medical history significant for hypertension, hyperlipidemia, family history of premature coronary artery disease in first-degree relatives, obesity with BMI greater than 35, history of tobacco use, osteoarthritis.  A Thallium stress test performed on 10/27/2018 prior to her back surgery showed no myocardial ischemia. There were no high risk prognostic indicators. There was marked breast attenuation artifact. Ejection fraction 88%.  She did undergo cervical spine surgery on 10/28/2018.  She recovered well from the surgery until she fell after trying to pick up a cigarette from the street and sliding off the curb.  Fortunately, the imaging studies of the C-spine did not demonstrate any fracture.    A 2D echo Doppler performed on 06/05/2019 demonstrated hyperdynamic left ventricular systolic function with ejection fraction 75%.  Normal right ventricular size and systolic function.  No significant valvular abnormalities.  No pericardial effusion.    Patient also has issues with falling and that is probably multifactorial and due to physical deconditioning, obesity, osteoarthritis, and gait disturbance.    This patient was evaluated with a perfusion imaging study in February 2020 the tomographic pattern was negative for ischemia.    She was last seen in our office on 05/26/2019 by Dr. Avie Arenas.  Notably she presented with atypical chest pain with a normal perfusion scan February 2020.  Simvastatin was discontinued and she was started on rosuvastatin 10 mg daily.  Additionally, she was asked to have evaluation with a 2D echo Doppler study-results are outlined above .  She was advised to continue smoking cessation, work on weight reduction and to increase her exercise.  She was asked to follow back up in our office approximately 8 months.    For her chronic back pain she is followed in the spine/rehab center at [facility].  At her last follow-up visit in August she elected to proceed with spinal cord stimulator trial.    Today the patient has complaints of chest discomfort, jaw pain, fatigue and occasional episodes of lightheadedness in the morning without orthostatic symptoms..  She has not been checking her blood pressure at homeas her machine is broken.   She states that 3 months ago she had a severe episode backslash intrascapular pain that radiated through her throat and left side of her jaw which lasted approximately 15 minutes.  She did experience some shortness of breath with this episode.  She was able to relax and do some breathing exercises to get through it.  She had recurrence of similar but more intense episode approximately 2 months [place]  She notes that she had pain between her shoulder blades that radiated through to both sides of her jaw and chest and made her feel sick to her stomach and short of breath.  This came on very quickly and lasted approximately 30 minutes.  This episode was more severe than the first 1.  Again she sat down, did reathing exercises and finally the pain subsided.  She had  no recurrence of severe intrascapular or jaw pain.  She does note over the past 2 weeks she also felt heaviness in her chest like her heart is working hard, but her heart is not racing and she is not having palpitations, fluttering or hard hard heart beat.  She denies having PND, orthopnea, lower extremity edema and dizziness.  She has had no presyncope or syncopal episodes.  She denies myalgias.  She is not having headaches or flushing, She is not reporting TIA or CVA type symptoms.    She denies  recent falls.  She is not exercising because she states that she walks for too long her legs give out.  She recently had some to oak mite bites, toenail healed so she plans to rejoin the [facility] so she can do some water exercises.  She is fairly sedentary.  Unfortunately, she does continue to smoke.  She does have a significant family history of coronary artery disease 7 of her brothers had MIs and MI, deceased.  Mother had CHF.    The most recent cholesterol profile obtained on 12/03/2019 showed total cholesterol 189, triglycerides 167, HDL 64, LDL 92.  She is on rosuvastatin 10 mg daily.  We do not have a recent blood pressure reading.  Her blood pressure is treated with lisinopril 10 mg daily.  She has an elevated BMI greater than 35.    Assessment and Plan:  1.  Chest jaw pain.  She had 2 similar episodes over the past 3 months as described above.  No recurrence of jaw or interscapular pain.  Over the past 2 weeks has been experiencing intermittent chest heaviness.   She did have a thallium stress test February 2020 which did not demonstrate  myocardial ischemia.  There was significant breast attenuation artifact.  She had a normal 2D echo Doppler performed on 06/05/2019 hyperdynamic LV  function, EF 75%.  Given her risk profile including significant  family history of coronary disease and ongoing tobacco use I think she nees an updated ischemic work-up and recommend a cardiac PET stress test.  2.Hypertension.  Treated.  Her blood pressure has been controlled in the past.  She continues to take lisinopril 10 mg daily.  3.  Hyperlipidemia. Treated. On rosuvastatin 10 mg daily LDL is 92 which is at target for this patient.    4.  Family history of premature CAD.  5.  Obesity with BMI 35.  6.  Tobacco use.  7.  Status post cervical spine surgery on 10/28/2018.     -No medication changes.  ?Cardiac PET stress test.  ?Check fasting lipid profile, AST and AST March 2022.  ?Advised to go immediately to the emergency room  if she has severe chest pain or for chest pain symptoms worsen, accelerate or become more frequent or if she has symptoms that awaken her from sleep.  -Advised to stop smoking.  Discussed risks of continuing to smoke and options for smoking cessation.  ?Reviewed and discussed importance of healthy lifestyle including low-fat, low triglyceride, low carbohydrate, low sugar, low-sodium diet, weight reduction, exercise and smoking cessation.  ?Encouraged  to start a regular walking program or other type of non-weightbearing exercise such as water aerobics.  Advised to start out slowly with her exercise and over the next several weeks gradually build up to getting a minimum of 30 minutes of moderate exercise 5 days a week.    -Recommend weight reduction.    Follow-up: Follow-up with Dr. Avie Arenas in 6 months.  Patient is encouraged to contact our office if she has problems prior to next visit.    I have educated the patient on the plan of care today. Patient verballly expressed understanding and agreement with the plan. Instructions are outlined in the after visit summary document.      Thank you for allowing [place] to participate in the care of this individual.   If you have any questions please do not hesitate to contact our office.         Total time 40 minutes.  Estimated counseling time 25 minutes.  Counseled regarding. Reviewed medications, medication interactions, treatment options/risks/benefits,reviewed lab results, reviewed most recent cardiology records, blood pressure monitoring, blood pressure goals, sodium restricted diet, fluid restriction, reviewed signs and symptoms of angina and when to go to the emergency room,  follow-up plan        [facility]  Telehealth Patient Reported Vitals     Row Name 08/02/20 1034                Weight:  103 kg (227 lb)        Height:  1.727 m (5' 8)        Pain Score:  Zero              There is no height or weight on file to calculate BMI.     Past Medical History  Patient Active Problem List    Diagnosis Date Noted   ? Class 2 obesity in adult 05/26/2019   ? Family history of coronary artery disease 05/26/2019   ? Tobacco abuse 05/26/2019   ? Ambulates with cane 05/26/2019   ? Atypical chest pain 05/26/2019   ? S/P cervical spinal fusion 02/04/2019   ? Cervical stenosis of spine 10/28/2018   ? Tobacco abuse 10/24/2018     Smoked 1/2 to 1 pack cigarettes daily for 40 years.  Stopped (almost) late October 2019     ? Pre-operative cardiovascular examination 10/24/2018   ? Osteoporosis without current pathological fracture 10/15/2017   ? 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 Stenosis of cervical spine 04/17/2017   ? Cervical myelopathy (HCC) 04/17/2017   ? Cervical radiculopathy 04/17/2017   ? 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 Pain of left lower extremity 11/06/2016   ? Hyperlipemia 08/09/2009   ? Chest pain 08/09/2009     05/17 regadenoson sestamibi MPI: EF 74%, no ischemia.           Review of Systems   Constitutional: Positive for malaise/fatigue.   HENT: Negative.    Eyes: Negative.    Cardiovascular: Positive for chest pain.   Respiratory: Positive for shortness of breath.    Endocrine: Negative.    Hematologic/Lymphatic: Negative.    Skin: Negative.    Musculoskeletal: Negative.    Gastrointestinal: Negative.    Genitourinary: Negative.    Neurological: Positive for light-headedness.   Psychiatric/Behavioral: Negative.    Allergic/Immunologic: Negative.        Physical Exam  Vital signs were reviewed as reported by patient.  General Appearance:  visually appears well nourished, appears relaxed and comfortably, in no acute distress  Skin: appears intact, no visible rash or lesions  Lips & Mouth: no visible pallor or cyanosis  Neck veins: do not appear to be distended  Respiratory effort: respirations even and unlabored, respiratory distress  Lower extremities: no visible edema BLE  Neurologic Exam: no apparent focal motor deficits, moves all extremities, alert and oriented to  time, person and place  Language & Memory: speech clear, responds appropriately, seems to comprehend information        Cardiovascular Health Factors  Vitals BP Readings from Last 3 Encounters:   05/20/20 120/83   05/03/20 131/60   03/23/20 121/88     Wt Readings from Last 3 Encounters:   05/20/20 97.7 kg (215 lb 6.4 oz)   05/03/20 99.8 kg (220 lb)   03/30/20 101.2 kg (223 lb)     BMI Readings from Last 3 Encounters:   05/20/20 33.24 kg/m?   05/03/20 33.45 kg/m?   03/30/20 34.41 kg/m?      Smoking Social History     Tobacco Use   Smoking Status Former Smoker   ? Packs/day: 1.00   ? Years: 40.00   ? Pack years: 40.00   ? Types: Cigarettes   ? Quit date: 12/18/2017   ? Years since quitting: 2.6   Smokeless Tobacco Never Used   Tobacco Comment    variable 1-1/2 a day      Lipid Profile Cholesterol   Date Value Ref Range Status   12/03/2019 189  Final     HDL   Date Value Ref Range Status   12/03/2019 64  Final     LDL   Date Value Ref Range Status   12/03/2019 92  Final     Triglycerides   Date Value Ref Range Status   12/03/2019 167 (H) <150 Final      Blood Sugar Hemoglobin A1C   Date Value Ref Range Status   10/06/2018 5.8 4.0 - 6.0 % Final     Comment:     The ADA recommends that most patients with type 1 and type 2 diabetes maintain   an A1c level <7%.       Glucose   Date Value Ref Range Status   12/03/2019 94  Final   04/02/2019 100  Final   10/30/2018 102 (H) 70 - 100 MG/DL Final          Problems Addressed Today  Encounter Diagnoses   Name Primary?   ? Mixed hyperlipidemia Yes   ? Cervical stenosis of spine    ? Class 2 obesity due to excess calories without serious comorbidity with body mass index (BMI) of 37.0 to 37.9 in adult    ? Stenosis of cervical spine                    Current Medications (including today's revisions)  ? acetaminophen (TYLENOL) 500 mg tablet Take two tablets by mouth every 6 hours as needed for Pain. Max of 4,000 mg of acetaminophen in 24 hours.   ? albuterol (PROAIR HFA) 90 mcg/actuation inhaler Inhale 2 Puffs by mouth into the lungs every 6 hours as needed for Wheezing or Shortness of Breath. Shake well before use.   ? AMITR/GABAPEN/EMU OIL 12/26/08% CREAM (COMPOUND) Apply 0.5grams to affected area three times daily PRN for pain   ? amitriptyline (ELAVIL) 25 mg tablet Take 25 mg by mouth at bedtime daily.   ? aspirin EC 81 mg tablet Take 81 mg by mouth daily. Take with food.   ? busPIRone (BUSPAR) 5 mg tablet Take 10 mg by mouth twice daily.   ? calcium carbonate (CALCIUM 600 PO) Take 1 tablet by mouth daily.   ? celecoxib (CELEBREX) 200 mg capsule Take 200 mg by mouth daily.   ? cetirizine (ZYRTEC) 10 mg tablet Take 10 mg by mouth daily as  needed for Allergy symptoms.   ? Cholecalciferol (Vitamin D3) (VITAMIN D-3) 2,000 unit cap Take two capsules by mouth daily. (Patient taking differently: Take 1 capsule by mouth daily.)   ? clonazePAM (KLONOPIN) 0.5 mg tablet Take 0.5 mg by mouth daily as needed.   ? denosumab (PROLIA) 60 mg/mL injection Inject 60 mg under the skin every 180 days.   ? diclofenac (VOLTAREN) 1 % topical gel Apply 4 g topically to affected area four times daily.   ? duloxetine DR (CYMBALTA) 60 mg capsule Take 60 mg by mouth twice daily.   ? HYDROcodone/acetaminophen (NORCO) 10/325 mg tablet TAKE 1 TABLET BY MOUTH TWICE A DAY AS NEEDED FOR PAIN   ? insulin pen needles (disposable) (BD UF NANO PEN NEEDLES) 32 gauge x 5/32 pen needle Use one each as directed as Needed. Use with insulin injections.   ? insulin pen needles (disposable) (BD ULTRA-FINE MINI PEN NEEDLE) 31 gauge x 3/16 pen needle Use 1 each as directed as Needed. Use with Forteo pen.   ? ketoconazole (NIZORAL) 2 % topical cream Apply  topically to affected area twice daily.   ? lisinopril (PRINIVIL; ZESTRIL) 10 mg tablet Take 10 mg by mouth daily.   ? mupirocin (BACTROBAN) 2 % topical ointment Apply  topically to affected area twice daily.   ? omeprazole DR(+) (PRILOSEC) 40 mg capsule Take 40 mg by mouth daily before breakfast.   ? pregabalin (LYRICA) 225 mg capsule TAKE 1 CAPSULE BY MOUTH TWICE A DAY   ? QUEtiapine (SEROQUEL) 25 mg tablet Take 25 mg by mouth at bedtime daily.   ? rosuvastatin (CRESTOR) 10 mg tablet TAKE ONE TABLET BY MOUTH DAILY. INDICATIONS: EXCESSIVE FAT IN THE BLOOD   ? tiZANidine (ZANAFLEX) 4 mg tablet Take 4 mg by mouth every 6 hours as needed.   ? triamcinolone acetonide (KENALOG) 0.1 % topical ointment Apply  topically to affected area twice daily. Mix triamcinolone ointment in a 1:1 ratio with mupirocin. Apply to lesions daily and cover for 2-3 weeks until resolved

## 2020-08-22 ENCOUNTER — Encounter: Admit: 2020-08-22 | Discharge: 2020-08-22 | Payer: MEDICARE

## 2020-08-22 MED ORDER — ROSUVASTATIN 10 MG PO TAB
10 mg | ORAL_TABLET | Freq: Every day | ORAL | 3 refills | 90.00000 days | Status: AC
Start: 2020-08-22 — End: ?

## 2020-09-08 ENCOUNTER — Encounter: Admit: 2020-09-08 | Discharge: 2020-09-08 | Payer: MEDICARE

## 2020-09-08 NOTE — Telephone Encounter
Received call from patient. Patient requesting Lyrica refill. Noted that patient still has refills on script from 07/26/20. Additionally, patient requesting to reschedule SCS trial in January. Spoke to pharmacy who states that patient picked up refill two days ago. Staff message sent to [facility] to reschedule SCS trial as requested    Trudie Buckler, RN

## 2020-10-20 ENCOUNTER — Encounter: Admit: 2020-10-20 | Discharge: 2020-10-20 | Payer: MEDICARE

## 2020-10-20 NOTE — Telephone Encounter
Received return call from patient. Patient is concerned about upcoming SCS trial and would like to discuss further with provider before undergoing procedure. Recommended that patient make a telehealth visit with Bella Kennedy, APRN tomorrow or reschedule trial. LVM requesting call back or call to scheduling to make appointment    Trudie Buckler, RN

## 2020-10-20 NOTE — Telephone Encounter
Received call from Kristen Vaughn requesting call back regarding her appointment on Monday. Returned call and LVM regarding appointment

## 2020-11-18 ENCOUNTER — Encounter: Admit: 2020-11-18 | Discharge: 2020-11-18 | Payer: MEDICARE

## 2020-11-18 NOTE — Telephone Encounter
Patient called left message to confirm 2/28 procedure and to see about fasting or holding medications prior. Discussed with APRN and called pt back to let her know nothing to eat for 6 hours prior and clear liquids up to 2 hours prior to procedure time and pt confirms she isnt on any blood thinners only a baby aspirin which is ok to take, patient verbalizes understanding.

## 2020-11-21 ENCOUNTER — Ambulatory Visit: Admit: 2020-11-21 | Discharge: 2020-11-21 | Payer: MEDICARE

## 2020-11-21 ENCOUNTER — Encounter: Admit: 2020-11-21 | Discharge: 2020-11-21 | Payer: MEDICARE

## 2020-11-21 DIAGNOSIS — Z8679 Personal history of other diseases of the circulatory system: Secondary | ICD-10-CM

## 2020-11-21 DIAGNOSIS — G959 Disease of spinal cord, unspecified: Secondary | ICD-10-CM

## 2020-11-21 DIAGNOSIS — I10 Essential (primary) hypertension: Secondary | ICD-10-CM

## 2020-11-21 DIAGNOSIS — M4802 Spinal stenosis, cervical region: Secondary | ICD-10-CM

## 2020-11-21 DIAGNOSIS — K5900 Constipation, unspecified: Secondary | ICD-10-CM

## 2020-11-21 DIAGNOSIS — G5600 Carpal tunnel syndrome, unspecified upper limb: Secondary | ICD-10-CM

## 2020-11-21 DIAGNOSIS — Z86010 Personal history of colonic polyps: Secondary | ICD-10-CM

## 2020-11-21 DIAGNOSIS — F419 Anxiety disorder, unspecified: Secondary | ICD-10-CM

## 2020-11-21 DIAGNOSIS — Z8614 Personal history of Methicillin resistant Staphylococcus aureus infection: Secondary | ICD-10-CM

## 2020-11-21 DIAGNOSIS — M797 Fibromyalgia: Secondary | ICD-10-CM

## 2020-11-21 DIAGNOSIS — M5416 Radiculopathy, lumbar region: Secondary | ICD-10-CM

## 2020-11-21 DIAGNOSIS — E785 Hyperlipidemia, unspecified: Secondary | ICD-10-CM

## 2020-11-21 DIAGNOSIS — M47816 Spondylosis without myelopathy or radiculopathy, lumbar region: Secondary | ICD-10-CM

## 2020-11-21 DIAGNOSIS — M5020 Other cervical disc displacement, unspecified cervical region: Secondary | ICD-10-CM

## 2020-11-21 DIAGNOSIS — M545 Chronic bilateral low back pain without sciatica: Secondary | ICD-10-CM

## 2020-11-21 DIAGNOSIS — J189 Pneumonia, unspecified organism: Secondary | ICD-10-CM

## 2020-11-21 DIAGNOSIS — K219 Gastro-esophageal reflux disease without esophagitis: Secondary | ICD-10-CM

## 2020-11-21 MED ORDER — CEFAZOLIN INJ 1GM IVP
2 g | Freq: Once | INTRAVENOUS | 0 refills | Status: CP
Start: 2020-11-21 — End: ?

## 2020-11-21 MED ORDER — MIDAZOLAM 1 MG/ML IJ SOLN
2-5 mg | Freq: Once | INTRAVENOUS | 0 refills | Status: CP
Start: 2020-11-21 — End: ?

## 2020-11-21 MED ORDER — LIDOCAINE (PF) 10 MG/ML (1 %) IJ SOLN
2 mL | Freq: Once | INTRAMUSCULAR | 0 refills | Status: CP
Start: 2020-11-21 — End: ?

## 2020-11-21 MED ORDER — FENTANYL CITRATE (PF) 50 MCG/ML IJ SOLN
50-100 ug | Freq: Once | INTRAVENOUS | 0 refills | Status: CP
Start: 2020-11-21 — End: ?

## 2020-11-21 MED ORDER — BUPIVACAINE (PF) 0.25 % (2.5 MG/ML) IJ SOLN
10 mL | Freq: Once | INTRAMUSCULAR | 0 refills | Status: CP
Start: 2020-11-21 — End: ?

## 2020-11-21 NOTE — Procedures
Spinal Cord Stimulator Trial  Date of Service: 11/21/20  Procedure Title(s):   1. Percutaneous placement of Nevro trial octad spinal cord stimulator leads x 2   2. Intraoperative fluoroscopy    Attending Surgeon: Curly Rim, MD    Anesthesia: Local  Conscious sedation Yes, 100 mcg fentanyl, 2 mg midazolam    Indications: Kristen Vaughn is a 60 y.o. female with a diagnosis of back pain. The patient has failed more conservative measures such as physical therapy, medication management and is currently a non-operative candidate. The patient's history and physical exam were reviewed. The risks, benefits and alternatives to the procedure were discussed, and all questions were answered to the patient's satisfaction. The patient agreed to proceed, and written informed consent was obtained.     Procedure in Detail: IV was started? Yes    The patient was brought into the procedure room and placed in the prone position on the fluoroscopy table. Standard monitors were placed, and vital signs were observed throughout the procedure. An intravenous infusion of antibiotic (Ancef 2000mg ) was started and completed prior to the start of the procedure. The area of the thoracolumbar spine was prepped with Hibaclens and draped in a sterile manner.     The T12/L1 interspace was identified. The skin and subcutaneous tissues in the area were anesthetized with 1% lidocaine. A 14-gauge Tuohy epidural needle was advanced with a paramedian approach from the right using a loss of resistance technique with a glass syringe and air to identify entrance into the epidural space. Once a good loss of resistance was obtained, negative aspiration was confirmed. A percutaneous stimulator lead was advanced under fluoroscopic guidance until the 0-position contact was at the top of T8. A second lead was placed using the same technique from the T12/L1 interspace on the left until the 0-contact was at the top of T9. The leads were connected to an external trial programmer using a sterile connector. Several combinations of lead configuration, frequency, amplitude and pulse width were used until comfortable, appropriate coverage of the patient's pain area was obtained.     The needles were then carefully removed and the leads were secured to the skin using sterile dressing and tegaderms. The connector was also wrapped in gauze and secured to the patient.    Disposition: The patient tolerated the procedure well, and there were no apparent complications. Vital signs remained stable througout the procedure. The patient was taken to the recovery area where discharge instructions for the procedure were given. The patient will return to the clinic in one week to discuss the results of this trial.    Estimated Blood Loss: minimal    Specimens: none    Complications: none

## 2020-11-21 NOTE — Discharge Instructions - Supplementary Instructions
SPINAL CORD STIMULATOR: TRIAL    DISCHARGE INSTRUCTIONS    You may NOT drive today.  Though the procedure is generally safe, and complications are rare, we do ask that you be aware of any of the following:  Any swelling, persistent redness, new bleeding, or drainage from the site of the incision.  You should not experience a severe headache.  You should not run a fever over 101degrees.  New onset of sharp, severe back and or neck pain.  New onset of upper or lower extremity numbness or weakness.  New difficulty controlling bowel or bladder function.  New shortness of breath.  Your pain coverage changes or if you start sensing a shooting pain in an extremity.    ** If any of these occur, please call to report this occurrence to a nurse at [phone]. If you are calling after 4:00 p.m. or on weekends or holidays, please call [phone] and ask to have the resident physician on call for the physician paged or go to your local emergency room.  Do NOT shower. The bandage cannot get wet. You will have to sponge bathe only.  Do NOT remove the bandage.  If it becomes loose it can be reinforced with tape.  Keep dressing dry.  Avoid bending, twisting, lifting arms over head or heavy lifting during the trial.  As much as possible go about your daily activities to determine if the stimulator will significantly reduce your pain and increase your ability to function.  If you are on a daily extended-release pain medication, do not stop taking it.  It is possible that you will be able to greatly reduce the amount of the short acting pain medication from what you have normally needed.  IV site: If a lump or redness occurs apply a moist compress for 10 minutes, 4 times a day for 2-3 days.  If this persists greater than 3 days notify your surgeon.  Anesthetic may make you drowsy and slow to react for up to 12 hours, therefore, do not drive, operate machinery, sign legal documents, or work for the next 12 hours.    A responsible adult should be with you for 12 hours.    Follow up appointment in one week. If in the event you are unable to keep your appointment, please notify the scheduler 24 hours in advance at [phone].During your follow up appointment, you will discuss with the doctor the effectiveness of the stimulator during the trial period to determine if a permanent implantation is the direction for you.  Please bring your stimulator supplies with you to the appointment.  You may shower the day after the lead is removed in the office.    If you have questions for the surgery center, call [facility] at [phone].

## 2020-11-21 NOTE — Progress Notes
SPINE CENTER  INTERVENTIONAL PAIN PROCEDURE HISTORY AND PHYSICAL    Chief Complaint: Pain    HISTORY OF PRESENT ILLNESS:  Kristen Vaughn is a 60 y.o. year old female who presents for injection.  Denies fevers, chills, or recent hospitalizations.  Patient denies currently taking blood thinning medications.        Medical History:   Diagnosis Date   ? Anxiety and depression    ? Carpal tunnel syndrome    ? Cervical myelopathy (HCC)    ? Cervical stenosis of spine    ? Chest pain    ? Constipation    ? Essential hypertension    ? Fibromyalgia    ? GERD (gastroesophageal reflux disease)    ? Herniated disc, cervical    ? History of abnormal electrocardiogram 01/2016   ? History of colon polyps 2015   ? History of MRSA infection 2019    right hand/[facility]   ? Hyperlipemia 08/09/2009   ? Pneumonia        Surgical History:   Procedure Laterality Date   ? HX HYSTERECTOMY  1985   ? UPPER GASTROINTESTINAL ENDOSCOPY  2015   ? COLONOSCOPY  2015    hx colon polyps x2   ? CARDIOVASCULAR STRESS TEST  01/2016   ? FINGER TRIGGER RELEASE Right 2019   ? CARPAL TUNNEL RELEASE Right 2019   ? ANTERIOR CERVICAL DECOMPRESSION AND FUSION AT CERVICAL 6-7 N/A 10/28/2018    Performed by Criss Rosales, MD at [facility] OR   ? ARTHRODESIS SPINE ANTERIOR INTERBODY WITH DISCECTOMY/ OSTEOPHYTECTOMY/ DECOMPRESSION - CERVICAL BELOW C2 - EACH ADDITIONAL INTERSPACE N/A 10/28/2018    Performed by Criss Rosales, MD at [facility] OR   ? ANTERIOR INSTRUMENTATION - 2 TO 3 VERTEBRAL SEGMENTS N/A 10/28/2018    Performed by Criss Rosales, MD at [facility] OR   ? DILATION AND CURETTAGE  1980's       family history includes Cancer in her sister and another family member; Coronary Artery Disease in her father, mother, and other family members; Premature Heart Disease in an other family member; Stroke in an other family member.    Social History     Socioeconomic History   ? Marital status: Single     Spouse name: Not on file   ? Number of children: Not on file   ? Years of education: Not on file   ? Highest education level: Not on file   Occupational History   ? Not on file   Tobacco Use   ? Smoking status: Former Smoker     Packs/day: 1.00     Years: 40.00     Pack years: 40.00     Types: Cigarettes     Quit date: 12/18/2017     Years since quitting: 2.9   ? Smokeless tobacco: Never Used   ? Tobacco comment: variable 1-1/2 a day   Substance and Sexual Activity   ? Alcohol use: Yes     Alcohol/week: 0.0 standard drinks     Comment: social   ? Drug use: Yes     Types: Marijuana     Comment: rare for back pain   ? Sexual activity: Not on file   Other Topics Concern   ? Not on file   Social History Narrative   ? Not on file       Allergies   Allergen Reactions   ? Ascorbic Acid UNKNOWN   ? Erythromycin RASH  Rash on trunk   ? Strawberry UNKNOWN       There were no vitals filed for this visit.       REVIEW OF SYSTEMS: 10 point ROS obtained and negative except for back pain      PHYSICAL EXAM:  General: 60 y.o. female appears stated age, in no acute distress  HEENT: Normocephalic, atraumatic  Neck: No thyroidmegaly  Cardiovascular: Well perfused  Pulmonary: Unlabored respirations  Extremities: No cyanosis, clubbing, or edema  Skin: No lesions seen on exposed skin  Psychiatric:  Appropriate mood and affect  Musculoskeletal: No atrophy.   Neurologic: Antigravity strength in all extremities. CN II -XII grossly intact.  Alert and oriented x 3.         IMPRESSION:    1. Chronic bilateral low back pain without sciatica    2. Lumbar spondylosis    3. Lumbar radiculopathy         PLAN:   Nevro SCS trial x 2 leads    General Pre Procedural Sedation ASA Classification      I have discussed risks and alternatives of this type of sedation and procedure with: patient    NPO Status:Acceptable    Pregnancy Status: No    Prior Anesthetic Types: General    Patient's had previous experience with anesthesia and/or sedation complications: No    Family history of sedation complications: No    Airway: Airway assessment performed Mallampati  II (soft palate, uvula, fauces visible)    Head and Neck: No abnormalities noted    Mouth: No abnormalities noted    Medications for Procedural Sedation: Midazolam and Fentanyl    Anesthesia Classification:  ASA II (A normal patient with mild systemic disease)    Patient remains a candidate for procedure: Yes    The intention for the procedure today is Anxiolysis/Analgesia.

## 2020-11-28 ENCOUNTER — Ambulatory Visit: Admit: 2020-11-28 | Discharge: 2020-11-28 | Payer: MEDICARE

## 2020-11-28 ENCOUNTER — Encounter: Admit: 2020-11-28 | Discharge: 2020-11-28 | Payer: MEDICARE

## 2020-11-28 DIAGNOSIS — I10 Essential (primary) hypertension: Secondary | ICD-10-CM

## 2020-11-28 DIAGNOSIS — E785 Hyperlipidemia, unspecified: Secondary | ICD-10-CM

## 2020-11-28 DIAGNOSIS — Z86010 Personal history of colonic polyps: Secondary | ICD-10-CM

## 2020-11-28 DIAGNOSIS — G5600 Carpal tunnel syndrome, unspecified upper limb: Secondary | ICD-10-CM

## 2020-11-28 DIAGNOSIS — M545 Chronic bilateral low back pain without sciatica: Secondary | ICD-10-CM

## 2020-11-28 DIAGNOSIS — K219 Gastro-esophageal reflux disease without esophagitis: Secondary | ICD-10-CM

## 2020-11-28 DIAGNOSIS — M5020 Other cervical disc displacement, unspecified cervical region: Secondary | ICD-10-CM

## 2020-11-28 DIAGNOSIS — J189 Pneumonia, unspecified organism: Secondary | ICD-10-CM

## 2020-11-28 DIAGNOSIS — M797 Fibromyalgia: Secondary | ICD-10-CM

## 2020-11-28 DIAGNOSIS — Z8614 Personal history of Methicillin resistant Staphylococcus aureus infection: Secondary | ICD-10-CM

## 2020-11-28 DIAGNOSIS — G959 Disease of spinal cord, unspecified: Secondary | ICD-10-CM

## 2020-11-28 DIAGNOSIS — M4802 Spinal stenosis, cervical region: Secondary | ICD-10-CM

## 2020-11-28 DIAGNOSIS — K5900 Constipation, unspecified: Secondary | ICD-10-CM

## 2020-11-28 DIAGNOSIS — Z8679 Personal history of other diseases of the circulatory system: Secondary | ICD-10-CM

## 2020-11-28 DIAGNOSIS — F419 Anxiety disorder, unspecified: Secondary | ICD-10-CM

## 2020-11-30 ENCOUNTER — Encounter: Admit: 2020-11-30 | Discharge: 2020-11-30 | Payer: MEDICARE

## 2020-12-06 ENCOUNTER — Encounter: Admit: 2020-12-06 | Discharge: 2020-12-06 | Payer: MEDICARE

## 2020-12-06 DIAGNOSIS — M545 Chronic bilateral low back pain without sciatica: Secondary | ICD-10-CM

## 2020-12-06 IMAGING — MG MAMMOGRAM 3D SCREEN, BILATERAL
11 of 16 series · 11 of 16 positions shown · non-contrast
Comparison: none

[R CC (1 of 2)]
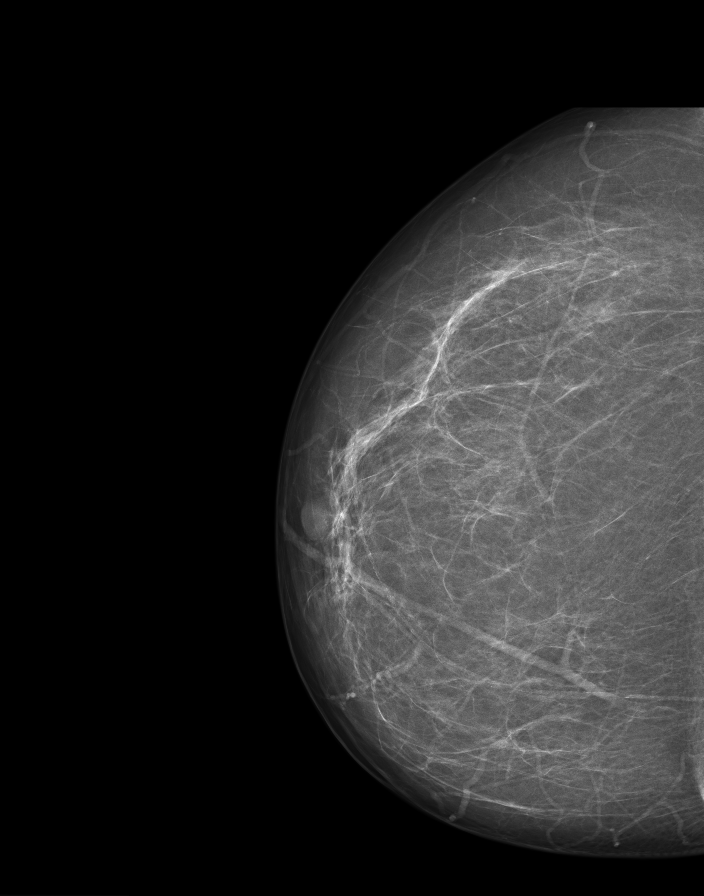

[R tomo (1 of 2)]
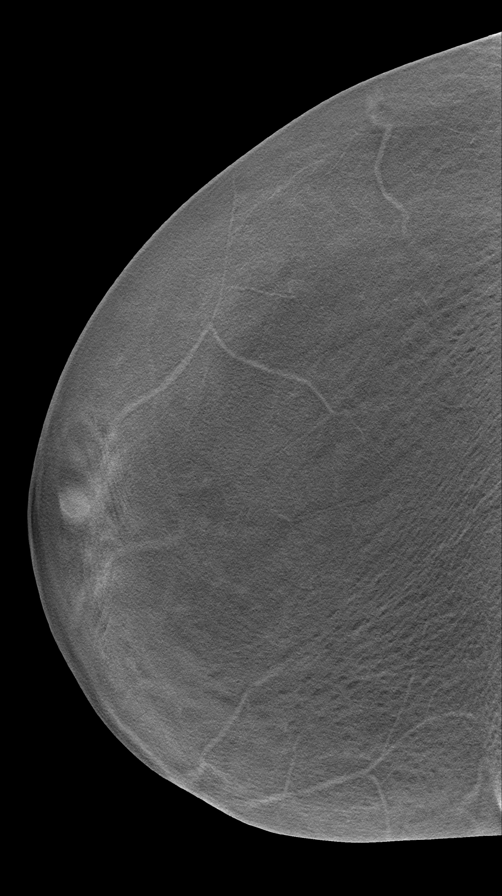

[R CC (2 of 2)]
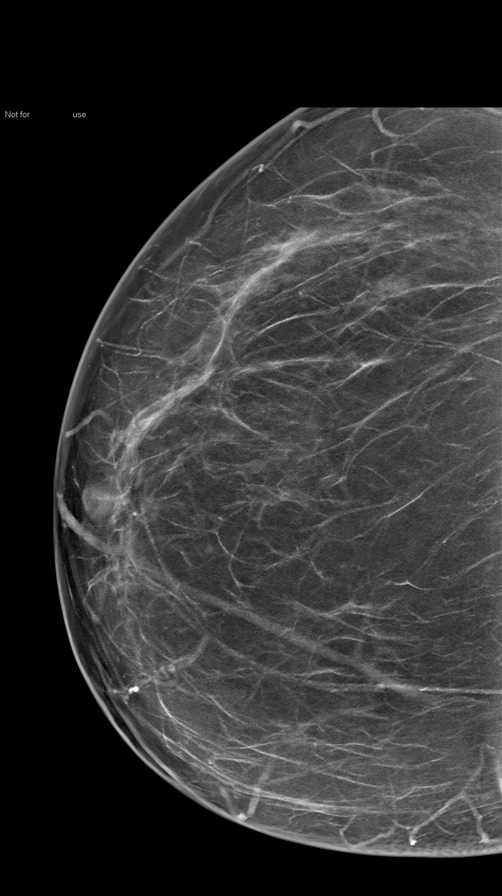

[R]
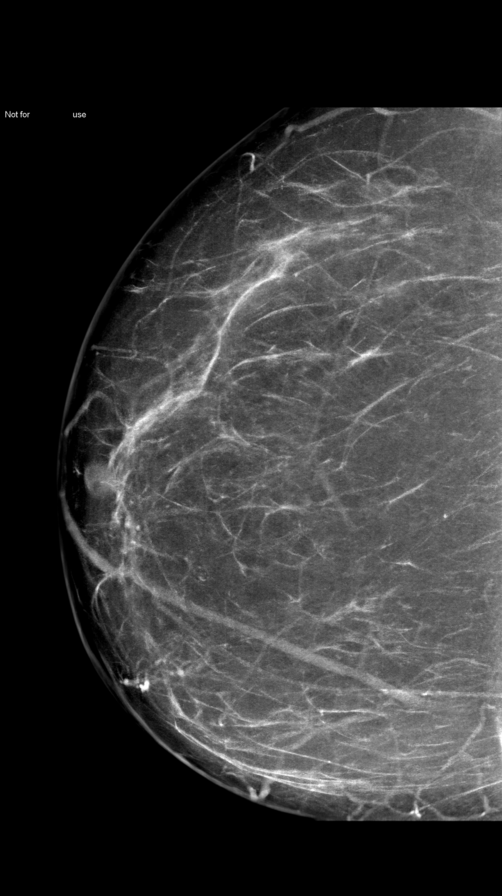

[L CC (1 of 2)]
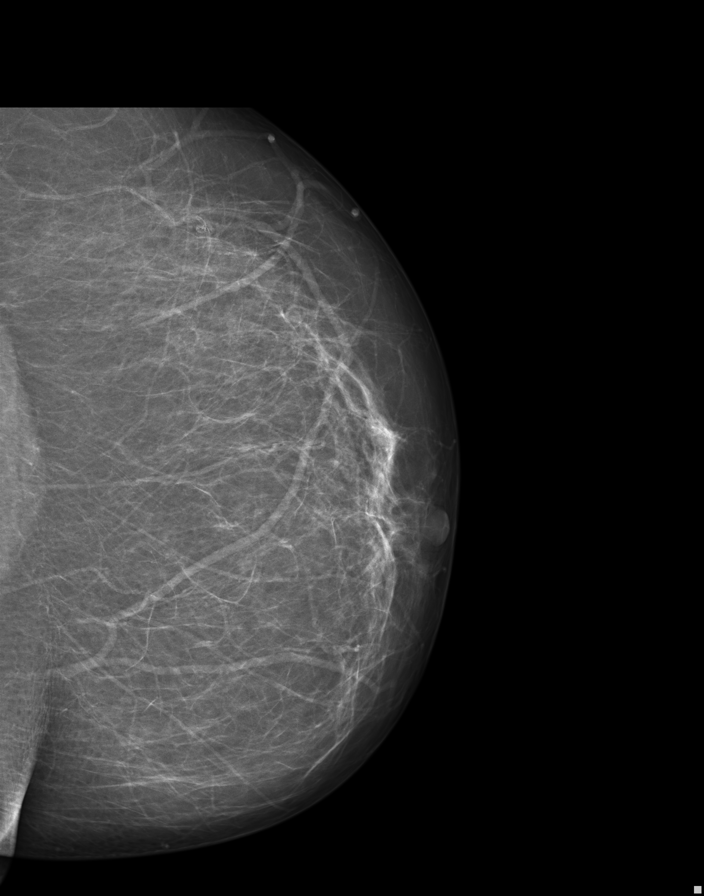

[L tomo]
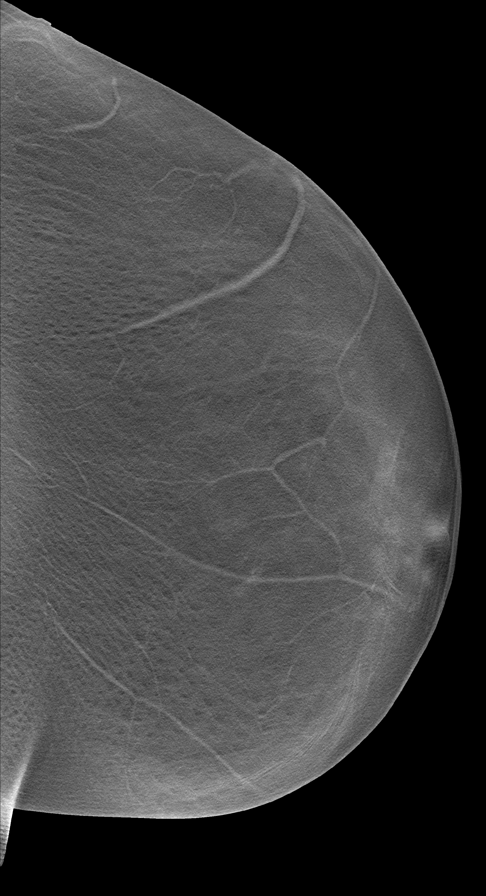

[L CC (2 of 2)]
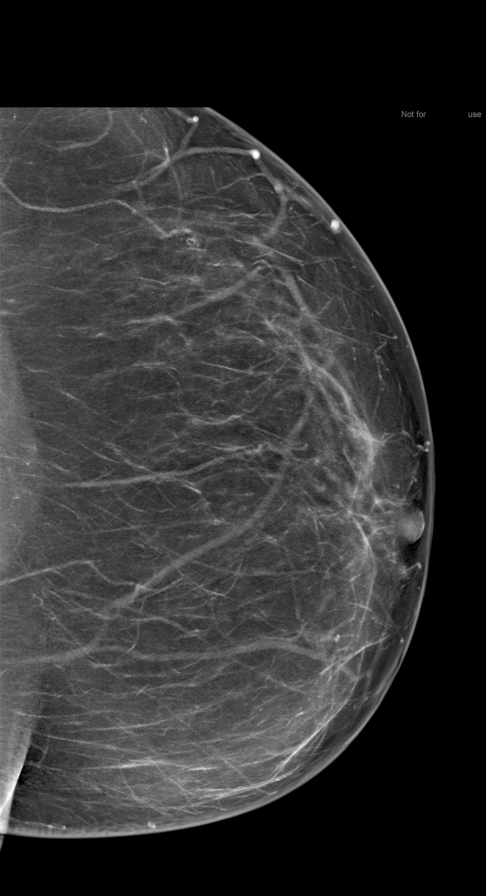

[L]
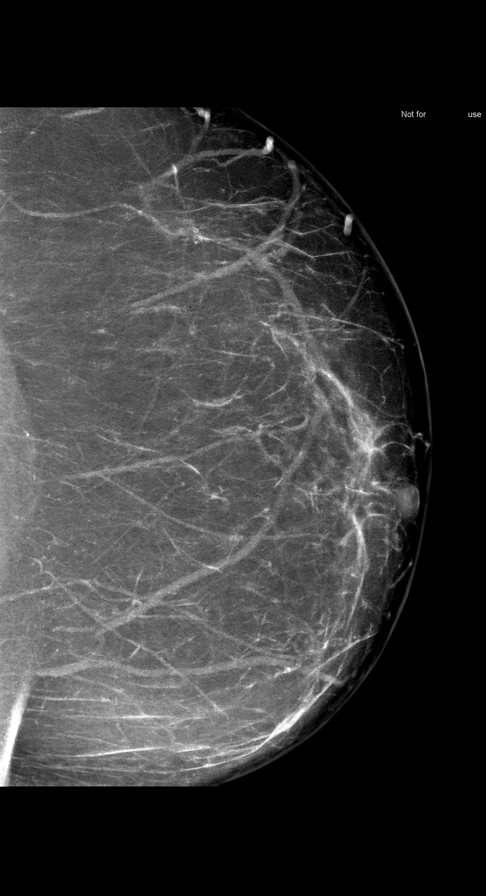

[R MLO (1 of 2)]
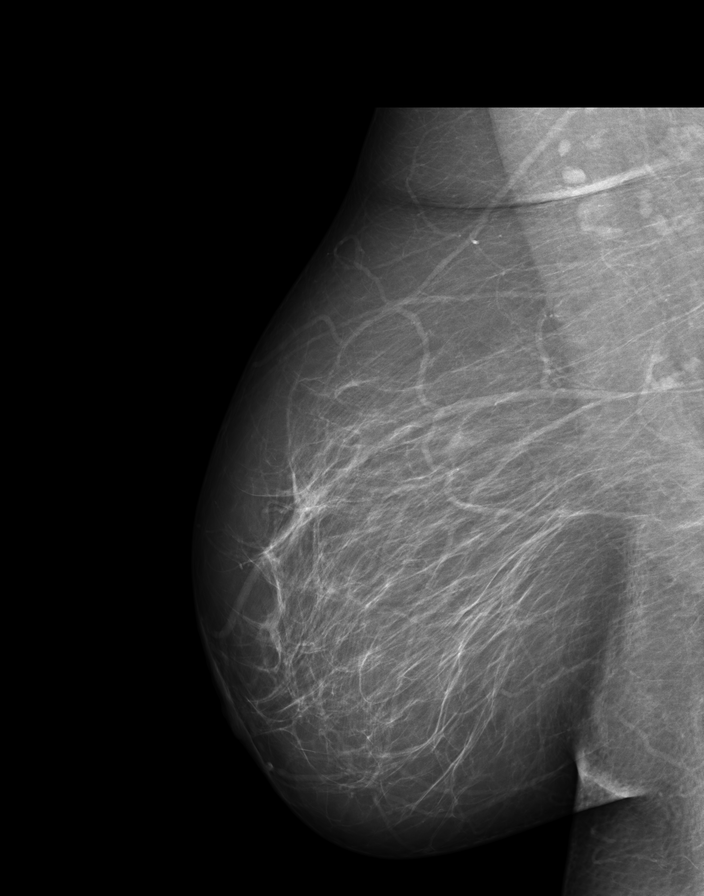

[R tomo (2 of 2)]
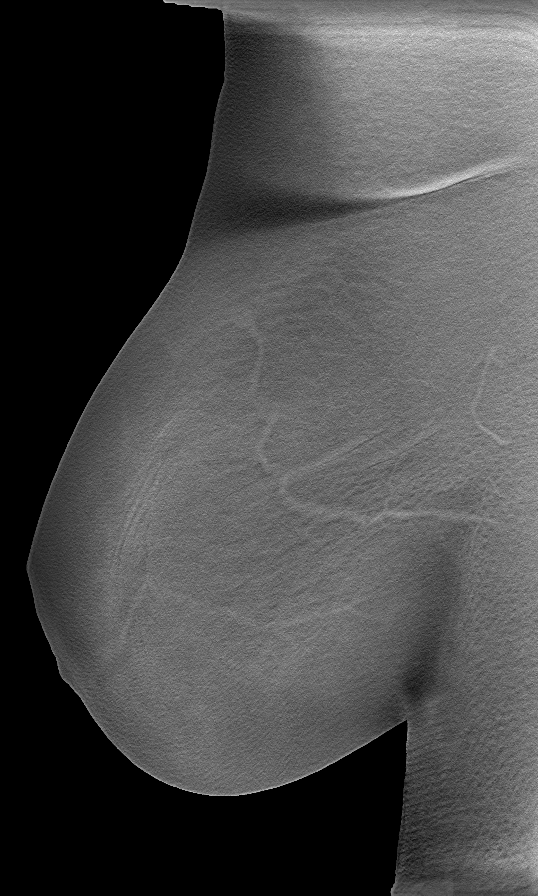

[R MLO (2 of 2)]
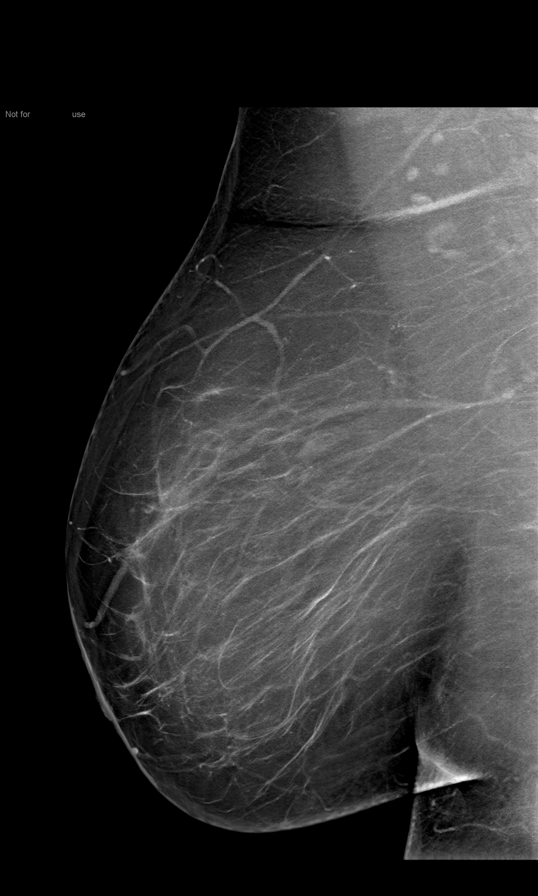

[11 of 16 positions shown; findings below may reference images not displayed]

EXAM
3D SCREENING MAMMOGRAM, BILATERAL

INDICATION
screen
SCR 3D. LM

TECHNIQUE
Digital 2D CC and MLO projections obtained with 3D tomographic views per manufacturer's protocol.
ICAD version 7.2 was used during this exam.

COMPARISONS
January 15, 2018

FINDINGS
ACR Type 1: <25% Breast is almost entirely fat.
Left breast is unchanged. Possible small developing density can be identified the superior lateral
aspect of the right breast. Ultrasound is suggested.

IMPRESSION
Small 1 cm rounded density superiorly and laterally in the right breast for which ultrasound is
suggested.
BI-RADS 0, INCOMPLETE: Need Additional Imaging Evaluation.

Tech Notes:

## 2020-12-06 NOTE — Telephone Encounter
Patient called and left message to see about talking to Dr Katrinka Blazing regarding a re-trial of the stimulator. Routing to  [employer] and Dr Katrinka Blazing.

## 2020-12-10 IMAGING — US BREASTRT
2 series · 14 of 25 positions shown · non-contrast
Comparison: none

[Series 1: us breast right complete · 1 of 3 slices shown (1 of 2)]
[im 1/3]
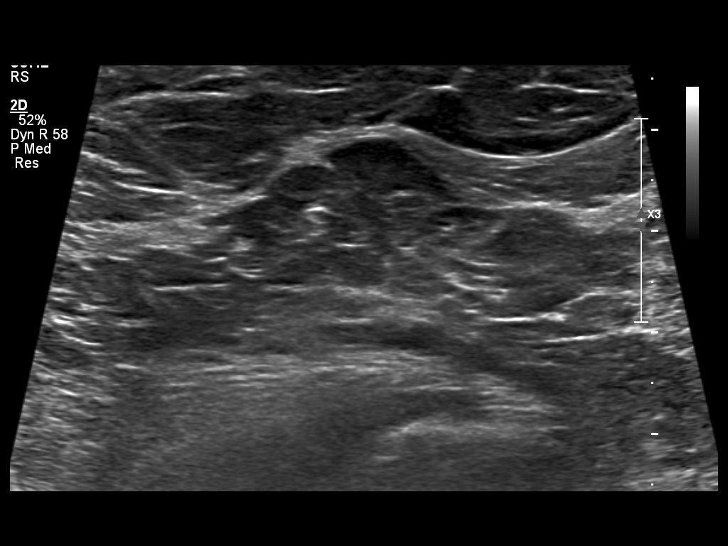

[Series 1: us breast right complete · 13 of 30 slices shown (2 of 2)]
[im 1/30]
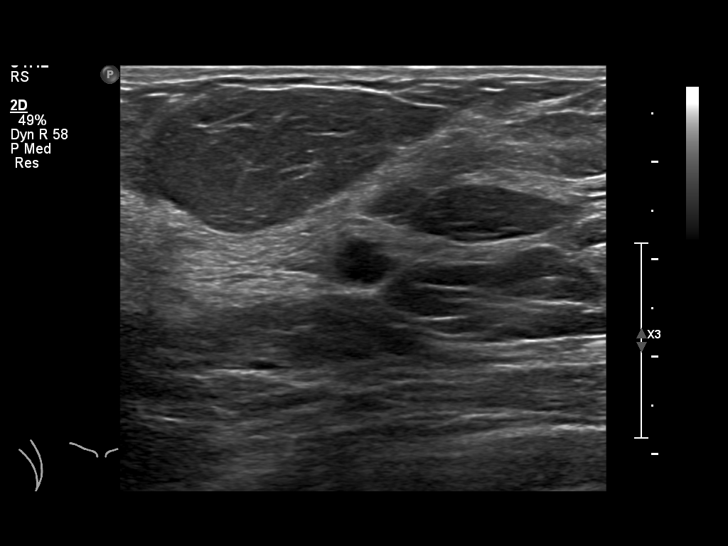
[im 3/30]
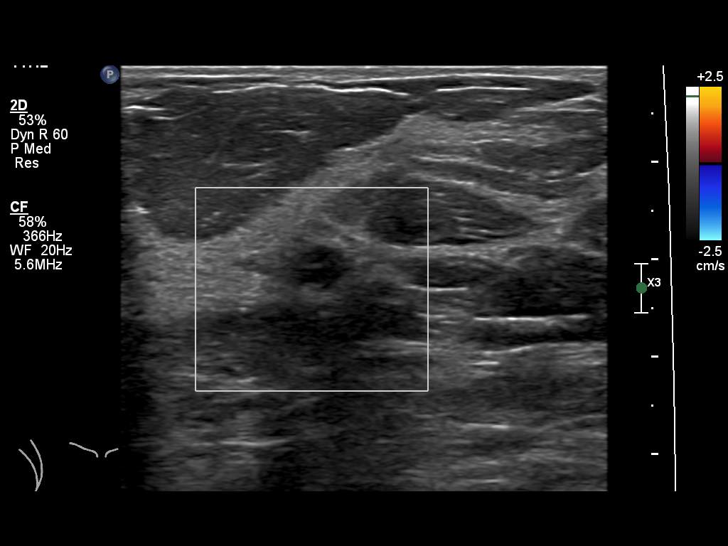
[im 6/30]
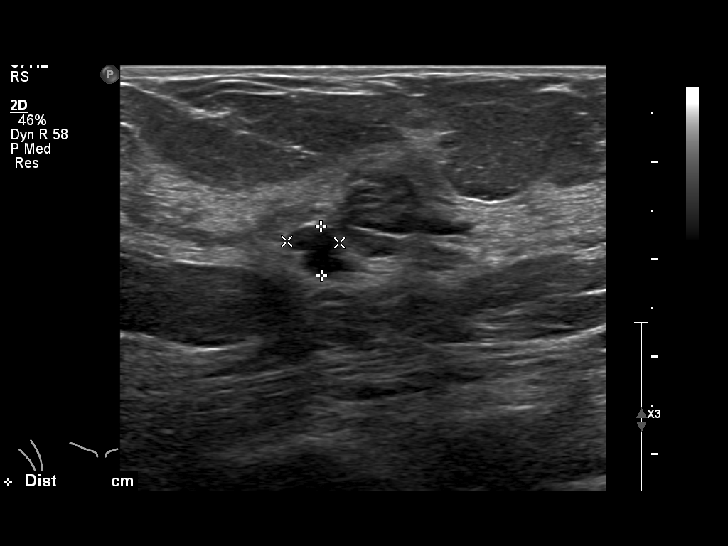
[im 8/30]
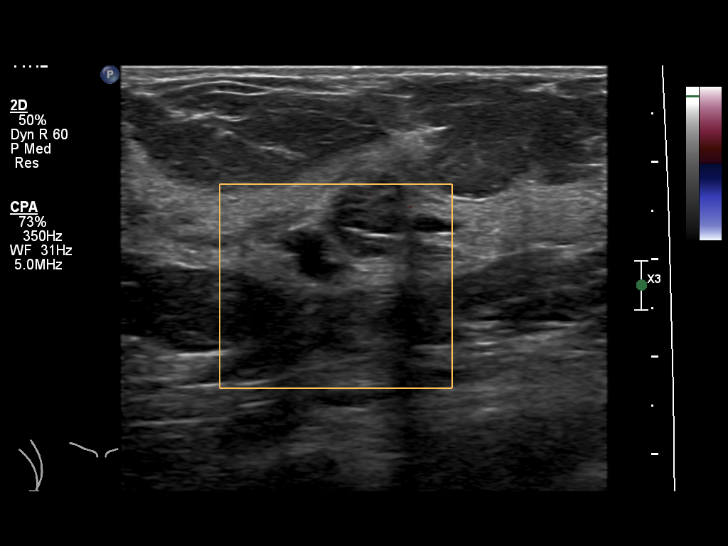
[im 10/30]
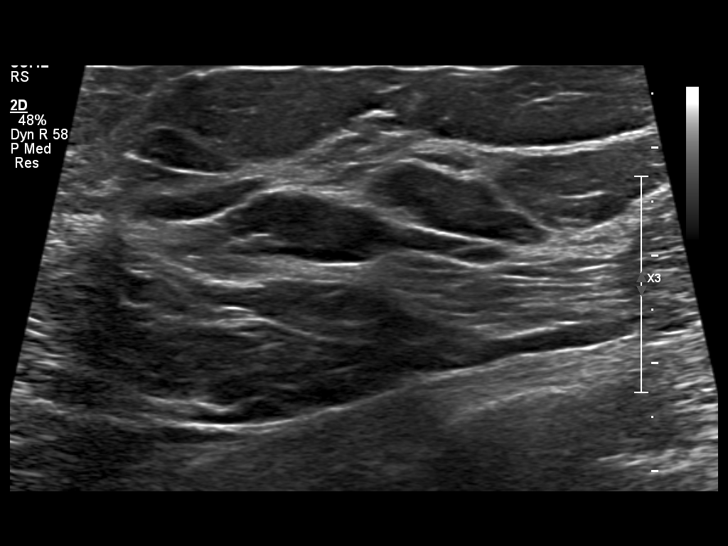
[im 12/30]
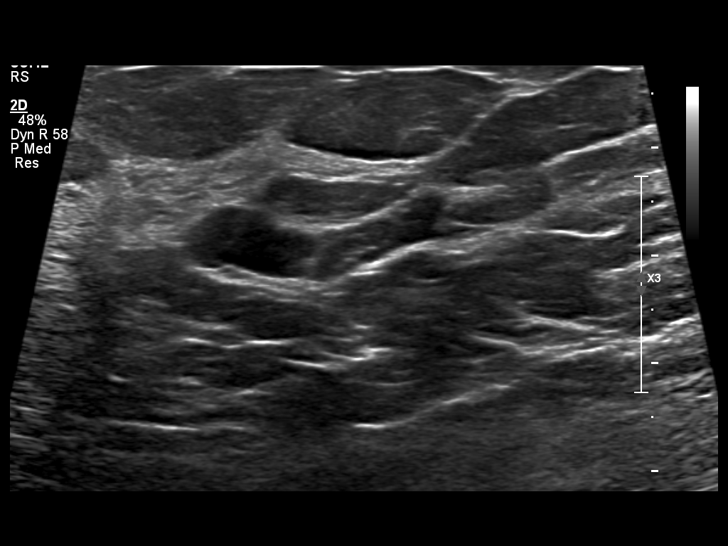
[im 15/30]
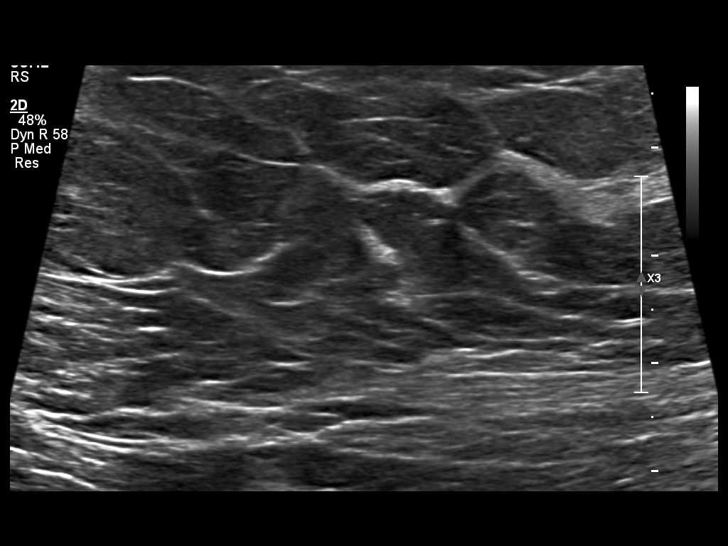
[im 18/30]
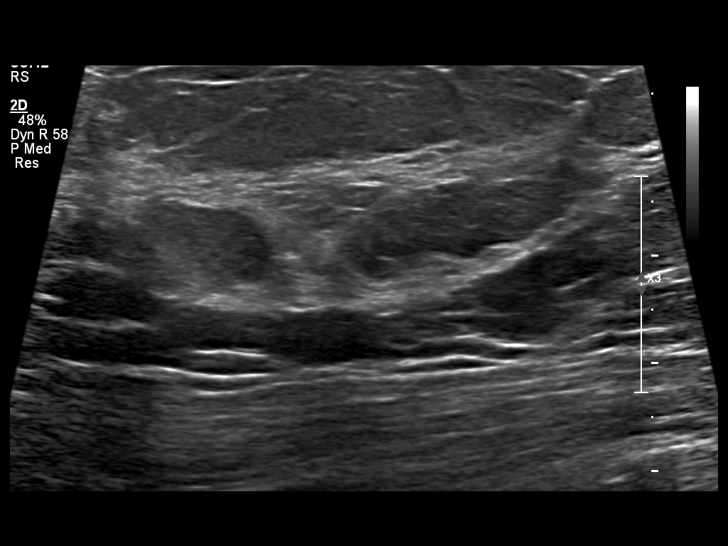
[im 19/30]
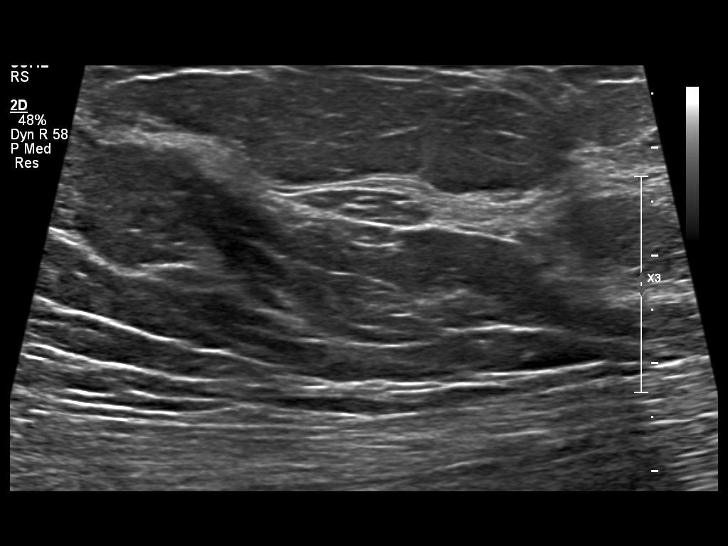
[im 22/30]
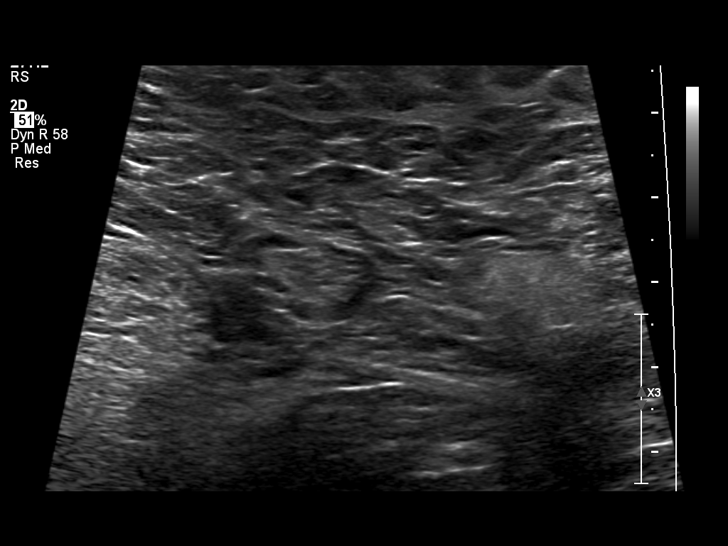
[im 24/30]
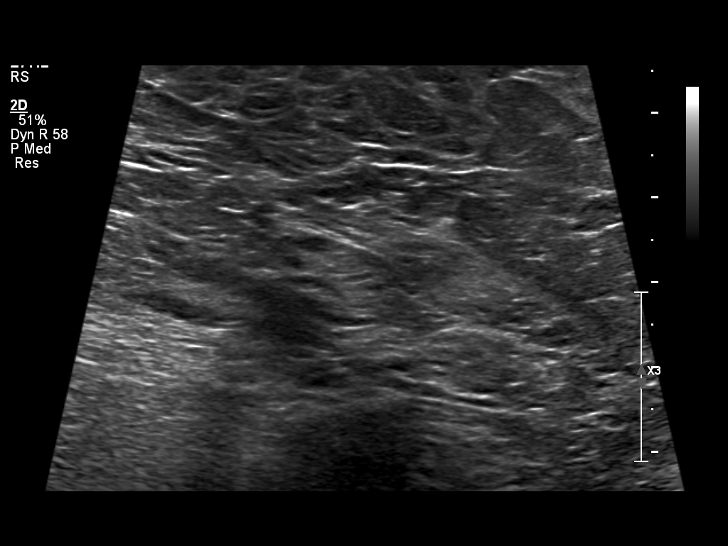
[im 27/30]
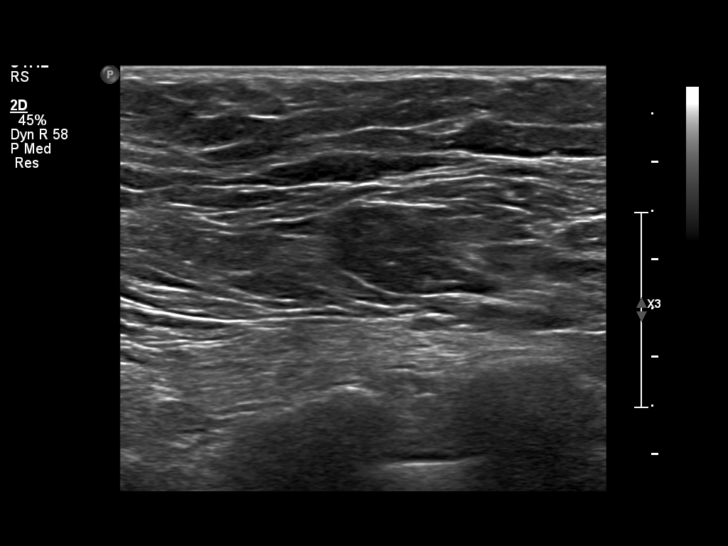
[im 30/30]
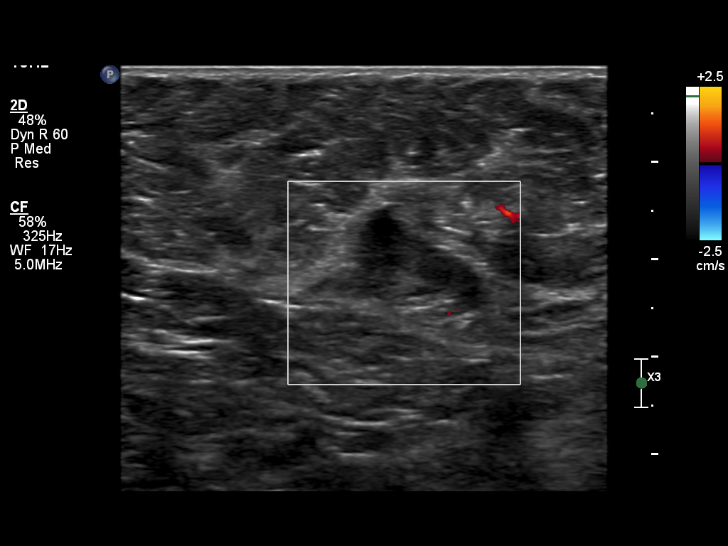

[14 of 25 positions shown; findings below may reference images not displayed]

EXAM

US breast RT complete

INDICATION

Screening mammogram abnormality

TECHNIQUE

Grayscale and Color Doppler imaging evaluation in the region of concern was performed

COMPARISONS

Screening mammogram 01/15/2018, 04/09/2019

FINDINGS

In the region of concern, there is a cluster of likely 2 cysts with possible septation at the 9
o'clock position, 8 centimeters from the nipple. Posterior acoustic enhancement. Parallel
orientation, avascular, no suspicious nodularity. Circumscribed margins.

No suspicious lymphadenopathy.

IMPRESSION
1. Likely conglomerate cluster of cysts versus a single septated cyst within the right lateral
breast at 9 o'clock.
2. BI-RADS 2, BENIGN.
3. A reminder letter will be sent.

Tech Notes:

FOLLOW UP MAMMOGRAM

## 2020-12-27 ENCOUNTER — Encounter: Admit: 2020-12-27 | Discharge: 2020-12-27 | Payer: MEDICARE

## 2020-12-27 DIAGNOSIS — M4802 Spinal stenosis, cervical region: Secondary | ICD-10-CM

## 2020-12-27 DIAGNOSIS — Z981 Arthrodesis status: Secondary | ICD-10-CM

## 2020-12-28 ENCOUNTER — Encounter: Admit: 2020-12-28 | Discharge: 2020-12-28 | Payer: MEDICARE

## 2020-12-28 NOTE — Telephone Encounter
Patient lvm stating she had appt today with Dr. Liam Rogers but had to cancel.  She is having a lot of neck pain.  Returned call.  Patient states she had to cancel appt due to transportation issues.  Informed patient appt had been rescheduled to 5/11.  Patient asking for Aundra Millet to call her.  Patient states she has fallen a few times.  Informed patient will have Megan contact her when she is back in the office.

## 2020-12-30 ENCOUNTER — Encounter: Admit: 2020-12-30 | Discharge: 2020-12-30 | Payer: MEDICARE

## 2021-01-10 ENCOUNTER — Encounter: Admit: 2021-01-10 | Discharge: 2021-01-10 | Payer: MEDICARE

## 2021-01-10 NOTE — Telephone Encounter
Attempt to call patient many times but goes to VM. Message left for patient to call our office when able.

## 2021-01-18 ENCOUNTER — Encounter: Admit: 2021-01-18 | Discharge: 2021-01-18 | Payer: MEDICARE

## 2021-01-23 ENCOUNTER — Encounter: Admit: 2021-01-23 | Discharge: 2021-01-23 | Payer: MEDICARE

## 2021-01-26 ENCOUNTER — Encounter: Admit: 2021-01-26 | Discharge: 2021-01-26 | Payer: MEDICARE

## 2021-01-26 DIAGNOSIS — Z8679 Personal history of other diseases of the circulatory system: Secondary | ICD-10-CM

## 2021-01-26 DIAGNOSIS — M5020 Other cervical disc displacement, unspecified cervical region: Secondary | ICD-10-CM

## 2021-01-26 DIAGNOSIS — M797 Fibromyalgia: Secondary | ICD-10-CM

## 2021-01-26 DIAGNOSIS — G5600 Carpal tunnel syndrome, unspecified upper limb: Secondary | ICD-10-CM

## 2021-01-26 DIAGNOSIS — K5900 Constipation, unspecified: Secondary | ICD-10-CM

## 2021-01-26 DIAGNOSIS — J189 Pneumonia, unspecified organism: Secondary | ICD-10-CM

## 2021-01-26 DIAGNOSIS — E782 Mixed hyperlipidemia: Secondary | ICD-10-CM

## 2021-01-26 DIAGNOSIS — Z8249 Family history of ischemic heart disease and other diseases of the circulatory system: Secondary | ICD-10-CM

## 2021-01-26 DIAGNOSIS — M4802 Spinal stenosis, cervical region: Secondary | ICD-10-CM

## 2021-01-26 DIAGNOSIS — G5732 Lesion of lateral popliteal nerve, left lower limb: Secondary | ICD-10-CM

## 2021-01-26 DIAGNOSIS — K219 Gastro-esophageal reflux disease without esophagitis: Secondary | ICD-10-CM

## 2021-01-26 DIAGNOSIS — Z8614 Personal history of Methicillin resistant Staphylococcus aureus infection: Secondary | ICD-10-CM

## 2021-01-26 DIAGNOSIS — F419 Anxiety disorder, unspecified: Secondary | ICD-10-CM

## 2021-01-26 DIAGNOSIS — Z720 Tobacco use: Secondary | ICD-10-CM

## 2021-01-26 DIAGNOSIS — G959 Disease of spinal cord, unspecified: Secondary | ICD-10-CM

## 2021-01-26 DIAGNOSIS — Z9989 Dependence on other enabling machines and devices: Secondary | ICD-10-CM

## 2021-01-26 DIAGNOSIS — I10 Essential (primary) hypertension: Secondary | ICD-10-CM

## 2021-01-26 DIAGNOSIS — E785 Hyperlipidemia, unspecified: Secondary | ICD-10-CM

## 2021-01-26 DIAGNOSIS — Z86010 Personal history of colonic polyps: Secondary | ICD-10-CM

## 2021-01-26 DIAGNOSIS — M79605 Pain in left leg: Secondary | ICD-10-CM

## 2021-01-26 NOTE — Progress Notes
Date of Service: 01/26/2021    Kristen Vaughn is a 60 y.o. female.       HPI     Patient is a 60 year old white female that has a history of hypertension, hyperlipidemia, active tobacco use, osteoarthritis with continued severe back pain and status post cervical spine surgery in February 2020, family history of premature coronary artery disease, profound physical deconditioning, probably COPD, balance issues and bilateral leg weakness most likely related to osteoarthritis and underlying spine pathology.    Patient reports that approximately 2?3 months ago on 2 different occasions [place] she experienced very severe chest pain.  Patient describes these as an intense, tightness that started in her shoulder blades and radiated to the jaw, it was associated with diaphoresis and shortness of breath, these episodes lasted for approximately 30 minutes.  On both occasions patient was in the casino and slot machines, she did walk up to the door and talked to the security guard.  She did not take any sublingual nitroglycerin and it did not go to the hospital.    Patient is scheduled to undergo a spinal stimulator on 01/30/2021.    Today in our office her blood pressure was elevated.  Patient is on polypharmacy, she does take lisinopril 10 mg p.o. daily for hypertension.         Vitals:    01/26/21 1623   BP: (!) 140/82   BP Source: Arm, Left Upper   Pulse: 70   SpO2: 97%   O2 Device: None (Room air)   PainSc: Zero   Weight: 103 kg (227 lb)   Height: 170.2 cm (5' 7)     Body mass index is 35.55 kg/m?Marland Kitchen     Past Medical History  Patient Active Problem List    Diagnosis Date Noted   ? Class 2 obesity in adult 05/26/2019   ? Family history of coronary artery disease 05/26/2019   ? Tobacco abuse 05/26/2019   ? Ambulates with cane 05/26/2019   ? Atypical chest pain 05/26/2019   ? S/P cervical spinal fusion 02/04/2019   ? Cervical stenosis of spine 10/28/2018   ? Tobacco abuse 10/24/2018     Smoked 1/2 to 1 pack cigarettes daily for 40 years.  Stopped (almost) late October 2019     ? Pre-operative cardiovascular examination 10/24/2018   ? Osteoporosis without current pathological fracture 10/15/2017   ? 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 Stenosis of cervical spine 04/17/2017   ? Cervical myelopathy (HCC) 04/17/2017   ? Cervical radiculopathy 04/17/2017   ? 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 Pain of left lower extremity 11/06/2016   ? Hyperlipemia 08/09/2009   ? Chest pain 08/09/2009     05/17 regadenoson sestamibi MPI: EF 74%, no ischemia.           Review of Systems   Constitutional: Negative.   HENT: Negative.    Eyes: Negative.    Cardiovascular: Negative.    Respiratory: Negative.    Endocrine: Negative.    Hematologic/Lymphatic: Negative.    Skin: Negative.    Musculoskeletal: Negative.    Gastrointestinal: Negative.    Genitourinary: Negative.    Neurological: Negative.    Psychiatric/Behavioral: Negative.    Allergic/Immunologic: Negative.        Physical Exam  General Appearance: normal in appearance  Skin: warm, moist, no ulcers or xanthomas  Eyes: conjunctivae and lids normal, pupils are equal and round  Lips & Oral  Mucosa: no pallor or cyanosis  Neck Veins: neck veins are flat, neck veins are not distended  Chest Inspection: chest is normal in appearance  Respiratory Effort: breathing comfortably, no respiratory distress  Auscultation/Percussion: lungs clear to auscultation, no rales or rhonchi, no wheezing  Cardiac Rhythm: regular rhythm and normal rate  Cardiac Auscultation: S1, S2 normal, no rub, no gallop  Murmurs: no murmur  Carotid Arteries: normal carotid upstroke bilaterally, no bruit  Lower Extremity Edema: no lower extremity edema  Abdominal Exam: soft, non-tender, no masses, bowel sounds normal  Liver & Spleen: no organomegaly  Language and Memory: patient responsive and seems to comprehend information  Neurologic Exam: neurological assessment grossly intact      Cardiovascular Studies  Twelve-lead EKG demonstrates normal sinus rhythm, incomplete RBBB, no ST segment T wave changes, ventricular rate is 77 bpm, no axis deviation    Cardiovascular Health Factors  Vitals BP Readings from Last 3 Encounters:   01/26/21 (!) 140/82   11/28/20 (!) 152/90   11/21/20 117/72     Wt Readings from Last 3 Encounters:   01/26/21 103 kg (227 lb)   11/28/20 103 kg (227 lb)   11/21/20 101.6 kg (224 lb)     BMI Readings from Last 3 Encounters:   01/26/21 35.55 kg/m?   11/28/20 35.03 kg/m?   11/21/20 36.71 kg/m?      Smoking Social History     Tobacco Use   Smoking Status Former Smoker   ? Packs/day: 1.00   ? Years: 40.00   ? Pack years: 40.00   ? Types: Cigarettes   ? Quit date: 12/18/2017   ? Years since quitting: 3.1   Smokeless Tobacco Never Used   Tobacco Comment    variable 1-1/2 a day      Lipid Profile Cholesterol   Date Value Ref Range Status   06/08/2020 161  Final     HDL   Date Value Ref Range Status   06/08/2020 54  Final     LDL   Date Value Ref Range Status   06/08/2020 93  Final     Triglycerides   Date Value Ref Range Status   06/08/2020 72  Final      Blood Sugar Hemoglobin A1C   Date Value Ref Range Status   10/06/2018 5.8 4.0 - 6.0 % Final     Comment:     The ADA recommends that most patients with type 1 and type 2 diabetes maintain   an A1c level <7%.       Glucose   Date Value Ref Range Status   06/08/2020 103  Final   12/03/2019 94  Final   04/02/2019 100  Final          Problems Addressed Today  Encounter Diagnoses   Name Primary?   ? Mixed hyperlipidemia Yes   ? Neuropathy of left peroneal nerve    ? Pain of left lower extremity    ? Cervical disc herniation    ? Stenosis of cervical spine    ? Cervical myelopathy (HCC)    ? Tobacco abuse    ? Family history of coronary artery disease    ? Ambulates with cane        Assessment and Plan     In summary: This is a 60 year old white female that presents with the following cardiovascular/clinical issues:    1.  Preoperative evaluation preceding implantable spinal stimulator?this procedure is scheduled for 01/30/2021  2.  Chest  pain episodes? see above description  3.  Primary hypertension? suboptimally controlled  4.  Continued tobacco use patient states that she is smoking 5-6 cigarettes a day  5.  Shortness of breath?this is probably secondary to her longstanding history of tobacco use and probably subsequent COPD  6.  Family history of premature CAD  7.  Obesity?patient's BMI is 35.55 kg/m?, she has been able to lose weight  8.  Hyperlipidemia?patient is on statin therapy    Plan:    1.  Continue all current medications  2.  From a cardiac standpoint patient does not have any contraindication to undergo the planned surgical implant of the spine stimulator, procedure is scheduled for 01/30/2021  3.  Further evaluation with a chest CT with and without contrast and a perfusion imaging study in the light of the episodes of chest pain that occurred 2-3 months ago, we will follow-up on the results of this test and we will call the patient with further recommendations.    Total Time Today was 30 minutes in the following activities: Preparing to see the patient, Obtaining and/or reviewing separately obtained history, Performing a medically appropriate examination and/or evaluation, Counseling and educating the patient/family/caregiver, Ordering medications, tests, or procedures, Referring and communication with other health care professionals (when not separately reported), Documenting clinical information in the electronic or other health record, Independently interpreting results (not separately reported) and communicating results to the patient/family/caregiver and Care coordination (not separately reported)         Current Medications (including today's revisions)  ? acetaminophen (TYLENOL) 500 mg tablet Take two tablets by mouth every 6 hours as needed for Pain. Max of 4,000 mg of acetaminophen in 24 hours.   ? albuterol (PROAIR HFA) 90 mcg/actuation inhaler Inhale 2 Puffs by mouth into the lungs every 6 hours as needed for Wheezing or Shortness of Breath. Shake well before use.   ? AMITR/GABAPEN/EMU OIL 12/26/08% CREAM (COMPOUND) Apply 0.5grams to affected area three times daily PRN for pain   ? aspirin EC 81 mg tablet Take 81 mg by mouth daily. Take with food.   ? busPIRone (BUSPAR) 5 mg tablet Take 10 mg by mouth twice daily.   ? calcium carbonate (CALCIUM 600 PO) Take 1 tablet by mouth daily.   ? celecoxib (CELEBREX) 200 mg capsule Take 200 mg by mouth daily.   ? cetirizine (ZYRTEC) 10 mg tablet Take 10 mg by mouth daily as needed for Allergy symptoms.   ? Cholecalciferol (Vitamin D3) (VITAMIN D-3) 2,000 unit cap Take two capsules by mouth daily. (Patient taking differently: Take 1 capsule by mouth daily.)   ? clonazePAM (KLONOPIN) 0.5 mg tablet Take 0.5 mg by mouth daily as needed.   ? denosumab (PROLIA) 60 mg/mL injection Inject 60 mg under the skin every 180 days.   ? diclofenac (VOLTAREN) 1 % topical gel Apply 4 g topically to affected area four times daily.   ? duloxetine DR (CYMBALTA) 60 mg capsule Take 60 mg by mouth twice daily.   ? fish oil- omega 3-DHA/EPA 300/1,000 mg capsule Take 1 capsule by mouth daily.   ? ketoconazole (NIZORAL) 2 % topical cream Apply  topically to affected area twice daily.   ? lisinopril (PRINIVIL; ZESTRIL) 10 mg tablet Take 10 mg by mouth daily.   ? mupirocin (BACTROBAN) 2 % topical ointment Apply  topically to affected area twice daily.   ? omeprazole DR(+) (PRILOSEC) 40 mg capsule Take 40 mg by mouth daily before breakfast.   ?  pregabalin (LYRICA) 225 mg capsule TAKE 1 CAPSULE BY MOUTH TWICE A DAY   ? QUEtiapine (SEROQUEL) 25 mg tablet Take 25 mg by mouth at bedtime daily.   ? REXULTI 1 mg tablet TAKE ONE (1) TABLET BY MOUTH DAILY FOR MOOD   ? rosuvastatin (CRESTOR) 10 mg tablet TAKE ONE TABLET BY MOUTH DAILY. INDICATIONS: EXCESSIVE FAT IN THE BLOOD   ? tiZANidine (ZANAFLEX) 4 mg tablet Take 4 mg by mouth every 6 hours as needed.   ? triamcinolone acetonide (KENALOG) 0.1 % topical ointment Apply  topically to affected area twice daily. Mix triamcinolone ointment in a 1:1 ratio with mupirocin. Apply to lesions daily and cover for 2-3 weeks until resolved

## 2021-01-26 NOTE — Patient Instructions
[facility] Nuclear Stress Test Instructions    PLEASE REPORT TO:    ____[facility] [phone], [place], [place]) - [phone]   ____[facility] ([address], [address], [place], [place]) - [phone]    ____[facility] ([address] [place], [place]) - [phone]    ____[facility] ([address], [address], [place], [place]) - [phone]   ____[facility] ([address] [place], [place] ) - [phone]     [facility] Main Phone Number: [phone]     Date of Test  ____________  at ____________  for ________________________    Are you able to raise your arm up by your head for about 20 minutes? yes  Can you lie on your back for approximately 20 minutes with minimal movement? yes     The Thallium evaluation has two parts -- two nuclear scans.   The first scan is done in the morning and the second three to four hours later.     Wear comfortable clothing. Bring or wear comfortable walking shoes.     It is recommended not to hold infants for 2 to 3 days after the test.   Please let the nuclear technologists know if you plan on flying after the test.     NO DECAF OR CAFFEINE 24 HOURS PRIOR TO TEST. Examples: coffee, tea, decaf coffee or tea, cola, chocolate.     DO NOT EAT OR DRINK THE MORNING OF YOUR TEST unless otherwise instructed. (You may have a couple sips of water.) If you are a diabetic, if insulin dependent: please take one third of your insulin with a light breakfast (two pieces of dry toast and a small juice). Bring insulin and medication with you to the test.     ___ TAKE MORNING MEDICATIONS WITH A COUPLE SIPS OF WATER PRIOR TO TEST.     Hold all vitamins and supplements on the morning of your test.     HOLD THE FOLLOWING MEDICATIONS AS INDICATED BELOW:             WHAT TO DO BETWEEN THE FIRST TWO THALLIUM SCANS:  1. No strenuous exercise should be performed during this time.  2. A light lunch is permissible. The technologist will give you a list of appropriate foods.  3. Please return 15 minutes prior to the schedule of your second scan. Our nuclear technologist will tell you exactly what time to return.  4. Please do not use tobacco products in between scans.  5. After the first scan is completed, you may resume usual medications.     TEST FINDINGS:  You will receive the results of the test within 7 business days of its completion by telephone, unless arranged differently at the time of the procedure.  If you have any questions concerning your thallium test or if you do not hear from your [facility] physician/or nurse within 7 business days, please call the appropriate office checked above.              [facility] Scheduling [phone]

## 2021-01-27 ENCOUNTER — Encounter: Admit: 2021-01-27 | Discharge: 2021-01-27 | Payer: MEDICARE

## 2021-01-27 DIAGNOSIS — R079 Chest pain, unspecified: Secondary | ICD-10-CM

## 2021-01-30 ENCOUNTER — Encounter: Admit: 2021-01-30 | Discharge: 2021-01-30 | Payer: MEDICARE

## 2021-01-30 ENCOUNTER — Ambulatory Visit: Admit: 2021-01-30 | Discharge: 2021-01-30 | Payer: MEDICARE

## 2021-01-30 DIAGNOSIS — K219 Gastro-esophageal reflux disease without esophagitis: Secondary | ICD-10-CM

## 2021-01-30 DIAGNOSIS — J189 Pneumonia, unspecified organism: Secondary | ICD-10-CM

## 2021-01-30 DIAGNOSIS — I10 Essential (primary) hypertension: Secondary | ICD-10-CM

## 2021-01-30 DIAGNOSIS — Z8614 Personal history of Methicillin resistant Staphylococcus aureus infection: Secondary | ICD-10-CM

## 2021-01-30 DIAGNOSIS — M545 Chronic bilateral low back pain without sciatica: Secondary | ICD-10-CM

## 2021-01-30 DIAGNOSIS — F419 Anxiety disorder, unspecified: Secondary | ICD-10-CM

## 2021-01-30 DIAGNOSIS — Z86010 Personal history of colonic polyps: Secondary | ICD-10-CM

## 2021-01-30 DIAGNOSIS — K5900 Constipation, unspecified: Secondary | ICD-10-CM

## 2021-01-30 DIAGNOSIS — M4802 Spinal stenosis, cervical region: Secondary | ICD-10-CM

## 2021-01-30 DIAGNOSIS — G959 Disease of spinal cord, unspecified: Secondary | ICD-10-CM

## 2021-01-30 DIAGNOSIS — M797 Fibromyalgia: Secondary | ICD-10-CM

## 2021-01-30 DIAGNOSIS — G5600 Carpal tunnel syndrome, unspecified upper limb: Secondary | ICD-10-CM

## 2021-01-30 DIAGNOSIS — E785 Hyperlipidemia, unspecified: Secondary | ICD-10-CM

## 2021-01-30 DIAGNOSIS — Z8679 Personal history of other diseases of the circulatory system: Secondary | ICD-10-CM

## 2021-01-30 DIAGNOSIS — M5020 Other cervical disc displacement, unspecified cervical region: Secondary | ICD-10-CM

## 2021-01-30 MED ORDER — BUPIVACAINE (PF) 0.25 % (2.5 MG/ML) IJ SOLN
10 mL | Freq: Once | INTRAMUSCULAR | 0 refills | Status: CP
Start: 2021-01-30 — End: ?

## 2021-01-30 MED ORDER — FENTANYL CITRATE (PF) 50 MCG/ML IJ SOLN
50-100 ug | Freq: Once | INTRAVENOUS | 0 refills | Status: CP
Start: 2021-01-30 — End: ?

## 2021-01-30 MED ORDER — LIDOCAINE (PF) 10 MG/ML (1 %) IJ SOLN
10 mL | Freq: Once | INTRAMUSCULAR | 0 refills | Status: CP
Start: 2021-01-30 — End: ?

## 2021-01-30 MED ORDER — CEFAZOLIN INJ 1GM IVP
2 g | Freq: Once | INTRAVENOUS | 0 refills | Status: CP
Start: 2021-01-30 — End: ?

## 2021-01-30 MED ORDER — MIDAZOLAM 1 MG/ML IJ SOLN
2-5 mg | Freq: Once | INTRAVENOUS | 0 refills | Status: CP
Start: 2021-01-30 — End: ?

## 2021-01-30 NOTE — Procedures
Spinal Cord Stimulator Trial  Date of Service: 01/30/21  Procedure Title(s):   1. Percutaneous placement of trial octad spinal cord stimulator leads x 2   2. Intraoperative fluoroscopy    Attending Surgeon: Curly Rim, MD    Anesthesia: Local  Conscious sedation Yes, 100 mcg fentanyl, 2 mg midazolam    Indications: Kristen Vaughn is a 60 y.o. female with a diagnosis of back pain. The patient has failed more conservative measures such as physical therapy, medication management and is currently a non-operative candidate. The patient's history and physical exam were reviewed. The risks, benefits and alternatives to the procedure were discussed, and all questions were answered to the patient's satisfaction. The patient agreed to proceed, and written informed consent was obtained.     Procedure in Detail: IV was started? Yes    The patient was brought into the procedure room and placed in the prone position on the fluoroscopy table. Standard monitors were placed, and vital signs were observed throughout the procedure. An intravenous infusion of antibiotic (Ancef 2000mg ) was started and completed prior to the start of the procedure. The area of the thoracolumbar spine was prepped with Hibaclens and draped in a sterile manner.     The T12/L1 interspace was identified. The skin and subcutaneous tissues in the area were anesthetized with 1% lidocaine. A 14-gauge Tuohy epidural needle was advanced with a paramedian approach from the right using a loss of resistance technique with a glass syringe and air to identify entrance into the epidural space. Once a good loss of resistance was obtained, negative aspiration was confirmed. A percutaneous stimulator lead was advanced under fluoroscopic guidance until the 0-position contact was at the midpoint of T7. A second lead was placed using the same technique from the T12/L1 interspace on the left until the 0-contact was at the midpoint of T8. The leads were connected to an external trial programmer using a sterile connector. Several combinations of lead configuration, frequency, amplitude and pulse width were used until comfortable, appropriate coverage of the patient's pain area was obtained.     The needles were then carefully removed and the leads were secured to the skin using sterile dressing and tegaderms. The connector was also wrapped in gauze and secured to the patient.    Disposition: The patient tolerated the procedure well, and there were no apparent complications. Vital signs remained stable througout the procedure. The patient was taken to the recovery area where discharge instructions for the procedure were given. The patient will return to the clinic in one week to discuss the results of this trial.    Estimated Blood Loss: minimal    Specimens: none    Complications: none

## 2021-01-30 NOTE — Progress Notes
SPINE CENTER  INTERVENTIONAL PAIN PROCEDURE HISTORY AND PHYSICAL    Chief Complaint: Pain    HISTORY OF PRESENT ILLNESS:  Kristen Vaughn is a 60 y.o. year old female who presents for injection.  Denies fevers, chills, or recent hospitalizations.          Medical History:   Diagnosis Date   ? Anxiety and depression    ? Carpal tunnel syndrome    ? Cervical myelopathy (HCC)    ? Cervical stenosis of spine    ? Chest pain    ? Constipation    ? Essential hypertension    ? Fibromyalgia    ? GERD (gastroesophageal reflux disease)    ? Herniated disc, cervical    ? History of abnormal electrocardiogram 01/2016   ? History of colon polyps 2015   ? History of MRSA infection 2019    right hand/[facility]   ? Hyperlipemia 08/09/2009   ? Pneumonia        Surgical History:   Procedure Laterality Date   ? HX HYSTERECTOMY  1985   ? UPPER GASTROINTESTINAL ENDOSCOPY  2015   ? COLONOSCOPY  2015    hx colon polyps x2   ? CARDIOVASCULAR STRESS TEST  01/2016   ? FINGER TRIGGER RELEASE Right 2019   ? CARPAL TUNNEL RELEASE Right 2019   ? ANTERIOR CERVICAL DECOMPRESSION AND FUSION AT CERVICAL 6-7 N/A 10/28/2018    Performed by Criss Rosales, MD at [facility] [place]   ? ARTHRODESIS SPINE ANTERIOR INTERBODY WITH DISCECTOMY/ OSTEOPHYTECTOMY/ DECOMPRESSION - CERVICAL BELOW C2 - EACH ADDITIONAL INTERSPACE N/A 10/28/2018    Performed by Criss Rosales, MD at [facility] OR   ? ANTERIOR INSTRUMENTATION - 2 TO 3 VERTEBRAL SEGMENTS N/A 10/28/2018    Performed by Criss Rosales, MD at [facility] OR   ? DILATION AND CURETTAGE  1980's       family history includes Cancer in her sister and another family member; Coronary Artery Disease in her father, mother, and other family members; Premature Heart Disease in an other family member; Stroke in an other family member.    Social History     Socioeconomic History   ? Marital status: Single     Spouse name: Not on file   ? Number of children: Not on file   ? Years of education: Not on file   ? Highest education level: Not on file   Occupational History   ? Not on file   Tobacco Use   ? Smoking status: Former Smoker     Packs/day: 1.00     Years: 40.00     Pack years: 40.00     Types: Cigarettes     Quit date: 12/18/2017     Years since quitting: 3.1   ? Smokeless tobacco: Never Used   ? Tobacco comment: variable 1-1/2 a day   Substance and Sexual Activity   ? Alcohol use: Yes     Alcohol/week: 0.0 standard drinks     Comment: social   ? Drug use: Yes     Types: Marijuana     Comment: rare for back pain   ? Sexual activity: Not on file   Other Topics Concern   ? Not on file   Social History Narrative   ? Not on file       Allergies   Allergen Reactions   ? Ascorbic Acid UNKNOWN   ? Erythromycin RASH     Rash on trunk   ?  Strawberry UNKNOWN       There were no vitals filed for this visit.          REVIEW OF SYSTEMS: 10 point ROS obtained and negative except for back pain      PHYSICAL EXAM:  General: 60 y.o. female appears stated age, in no acute distress  HEENT: Normocephalic, atraumatic  Neck: No thyroidmegaly  Cardiovascular: Well perfused  Pulmonary: Unlabored respirations  Extremities: No cyanosis, clubbing, or edema  Skin: No lesions seen on exposed skin  Psychiatric:  Appropriate mood and affect  Musculoskeletal: No atrophy.   Neurologic: Antigravity strength in all extremities. CN II -XII grossly intact.  Alert and oriented x 3.         IMPRESSION:    1. Chronic bilateral low back pain without sciatica         PLAN:   [employer]ev[employer] SCS trial x 2 leads

## 2021-01-30 NOTE — Discharge Instructions - Supplementary Instructions
SPINAL CORD STIMULATOR: TRIAL    DISCHARGE INSTRUCTIONS    You may NOT drive today.  Though the procedure is generally safe, and complications are rare, we do ask that you be aware of any of the following:  Any swelling, persistent redness, new bleeding, or drainage from the site of the incision.  You should not experience a severe headache.  You should not run a fever over 101degrees.  New onset of sharp, severe back and or neck pain.  New onset of upper or lower extremity numbness or weakness.  New difficulty controlling bowel or bladder function.  New shortness of breath.  Your pain coverage changes or if you start sensing a shooting pain in an extremity.    ** If any of these occur, please call to report this occurrence to a nurse at [phone]. If you are calling after 4:00 p.m. or on weekends or holidays, please call [phone] and ask to have the resident physician on call for the physician paged or go to your local emergency room.  Do NOT shower. The bandage cannot get wet. You will have to sponge bathe only.  Do NOT remove the bandage.  If it becomes loose it can be reinforced with tape.  Keep dressing dry.  Avoid bending, twisting, lifting arms over head or heavy lifting during the trial.  As much as possible go about your daily activities to determine if the stimulator will significantly reduce your pain and increase your ability to function.  If you are on a daily extended-release pain medication, do not stop taking it.  It is possible that you will be able to greatly reduce the amount of the short acting pain medication from what you have normally needed.  IV site: If a lump or redness occurs apply a moist compress for 10 minutes, 4 times a day for 2-3 days.  If this persists greater than 3 days notify your surgeon.  Anesthetic may make you drowsy and slow to react for up to 12 hours, therefore, do not drive, operate machinery, sign legal documents, or work for the next 12 hours.    A responsible adult should be with you for 12 hours.    Follow up appointment in one week. If in the event you are unable to keep your appointment, please notify the scheduler 24 hours in advance at [phone].During your follow up appointment, you will discuss with the doctor the effectiveness of the stimulator during the trial period to determine if a permanent implantation is the direction for you.  Please bring your stimulator supplies with you to the appointment.  You may shower the day after the lead is removed in the office.    If you have questions for the surgery center, call [facility] at [phone].

## 2021-02-01 ENCOUNTER — Encounter: Admit: 2021-02-01 | Discharge: 2021-02-01 | Payer: MEDICARE

## 2021-02-01 ENCOUNTER — Ambulatory Visit: Admit: 2021-02-01 | Discharge: 2021-02-01 | Payer: MEDICARE

## 2021-02-01 DIAGNOSIS — K219 Gastro-esophageal reflux disease without esophagitis: Secondary | ICD-10-CM

## 2021-02-01 DIAGNOSIS — G5600 Carpal tunnel syndrome, unspecified upper limb: Secondary | ICD-10-CM

## 2021-02-01 DIAGNOSIS — M4802 Spinal stenosis, cervical region: Secondary | ICD-10-CM

## 2021-02-01 DIAGNOSIS — I10 Essential (primary) hypertension: Secondary | ICD-10-CM

## 2021-02-01 DIAGNOSIS — Z86010 Personal history of colonic polyps: Secondary | ICD-10-CM

## 2021-02-01 DIAGNOSIS — W19XXXA Unspecified fall, initial encounter: Secondary | ICD-10-CM

## 2021-02-01 DIAGNOSIS — M5020 Other cervical disc displacement, unspecified cervical region: Secondary | ICD-10-CM

## 2021-02-01 DIAGNOSIS — Z8614 Personal history of Methicillin resistant Staphylococcus aureus infection: Secondary | ICD-10-CM

## 2021-02-01 DIAGNOSIS — Z8679 Personal history of other diseases of the circulatory system: Secondary | ICD-10-CM

## 2021-02-01 DIAGNOSIS — M545 Chronic bilateral low back pain without sciatica: Secondary | ICD-10-CM

## 2021-02-01 DIAGNOSIS — M797 Fibromyalgia: Secondary | ICD-10-CM

## 2021-02-01 DIAGNOSIS — E785 Hyperlipidemia, unspecified: Secondary | ICD-10-CM

## 2021-02-01 DIAGNOSIS — J189 Pneumonia, unspecified organism: Secondary | ICD-10-CM

## 2021-02-01 DIAGNOSIS — G959 Disease of spinal cord, unspecified: Secondary | ICD-10-CM

## 2021-02-01 DIAGNOSIS — F419 Anxiety disorder, unspecified: Secondary | ICD-10-CM

## 2021-02-01 DIAGNOSIS — K5900 Constipation, unspecified: Secondary | ICD-10-CM

## 2021-02-01 NOTE — Progress Notes
SPINE CENTER CLINIC NOTE       SUBJECTIVE:   Kristen Vaughn is a 60 y.o.-year-old female with history of ACDF, hyperlipidemia, and anxiety, who presents for evaluation after a fall on 01/30/2021.  Patient had a [employer] spinal cord stimulator trial placed on 01/30/2021.  She has concerns about possible lead migration after her fall.  Patient reports she was bending forward and reaching to grab something out of the floorboard of the backseat of a car and fell forward.  Patient reports she had to have assistance to get up out of the car.  Since that time she has not had any relief with spinal cord stimulator.  She denies hitting her head or losing consciousness.  She continues to have pain in the back and bilateral lower extremities.  VAS pain score is rated 8 out of 10 today.  She continues to ambulate with a cane.  She denies any loss of bowel or bladder function.        Review of Systems    Current Outpatient Medications:   ?  acetaminophen (TYLENOL) 500 mg tablet, Take two tablets by mouth every 6 hours as needed for Pain. Max of 4,000 mg of acetaminophen in 24 hours., Disp: 500 tablet, Rfl: 1  ?  albuterol sulfate (PROAIR HFA) 90 mcg/actuation HFA aerosol inhaler, Inhale 2 Puffs by mouth into the lungs every 6 hours as needed for Wheezing or Shortness of Breath. Shake well before use., Disp: , Rfl:   ?  AMITR/GABAPEN/EMU OIL 12/26/08% CREAM (COMPOUND), Apply 0.5grams to affected area three times daily PRN for pain, Disp: 50 g, Rfl: 3  ?  aspirin EC 81 mg tablet, Take 81 mg by mouth daily. Take with food., Disp: , Rfl:   ?  busPIRone (BUSPAR) 5 mg tablet, Take 10 mg by mouth twice daily., Disp: , Rfl:   ?  calcium carbonate (CALCIUM 600 PO), Take 1 tablet by mouth daily., Disp: , Rfl:   ?  celecoxib (CELEBREX) 200 mg capsule, Take 200 mg by mouth daily., Disp: , Rfl:   ?  cetirizine (ZYRTEC) 10 mg tablet, Take 10 mg by mouth daily as needed for Allergy symptoms., Disp: , Rfl:   ?  Cholecalciferol (Vitamin D3) (VITAMIN D-3) 2,000 unit cap, Take two capsules by mouth daily. (Patient taking differently: Take 1 capsule by mouth daily.), Disp: 300 capsule, Rfl: 11  ?  clonazePAM (KLONOPIN) 0.5 mg tablet, Take 0.5 mg by mouth daily as needed., Disp: , Rfl:   ?  denosumab (PROLIA) 60 mg/mL injection, Inject 60 mg under the skin every 180 days., Disp: , Rfl:   ?  diclofenac (VOLTAREN) 1 % topical gel, Apply 4 g topically to affected area four times daily., Disp: , Rfl:   ?  duloxetine DR (CYMBALTA) 60 mg capsule, Take 60 mg by mouth twice daily., Disp: , Rfl:   ?  fish oil- omega 3-DHA/EPA 300/1,000 mg capsule, Take 1 capsule by mouth daily., Disp: , Rfl:   ?  ketoconazole (NIZORAL) 2 % topical cream, Apply  topically to affected area twice daily., Disp: 60 g, Rfl: 11  ?  lisinopril (PRINIVIL; ZESTRIL) 10 mg tablet, Take 10 mg by mouth daily., Disp: , Rfl:   ?  mupirocin (BACTROBAN) 2 % topical ointment, Apply  topically to affected area twice daily., Disp: 22 g, Rfl: 11  ?  omeprazole DR(+) (PRILOSEC) 40 mg capsule, Take 40 mg by mouth daily before breakfast., Disp: , Rfl:   ?  pregabalin (LYRICA) 225 mg capsule, TAKE 1 CAPSULE BY MOUTH TWICE A DAY, Disp: 60 capsule, Rfl: 5  ?  QUEtiapine (SEROQUEL) 25 mg tablet, Take 25 mg by mouth at bedtime daily., Disp: , Rfl:   ?  REXULTI 1 mg tablet, TAKE ONE (1) TABLET BY MOUTH DAILY FOR MOOD, Disp: , Rfl:   ?  rosuvastatin (CRESTOR) 10 mg tablet, TAKE ONE TABLET BY MOUTH DAILY. INDICATIONS: EXCESSIVE FAT IN THE BLOOD, Disp: 90 tablet, Rfl: 3  ?  tiZANidine (ZANAFLEX) 4 mg tablet, Take 4 mg by mouth every 6 hours as needed., Disp: , Rfl:   ?  triamcinolone acetonide (KENALOG) 0.1 % topical ointment, Apply  topically to affected area twice daily. Mix triamcinolone ointment in a 1:1 ratio with mupirocin. Apply to lesions daily and cover for 2-3 weeks until resolved, Disp: 15 g, Rfl: 11    Current Facility-Administered Medications:   ?  diazePAM (VALIUM) tablet 5 mg, 5 mg, Oral, PRN, [name], MD, 5 mg at 04/27/19 1429  Allergies   Allergen Reactions   ? Ascorbic Acid UNKNOWN   ? Erythromycin RASH     Rash on trunk   ? Strawberry UNKNOWN     Physical Exam  Vitals:    02/01/21 1313   BP: 138/82   BP Source: Arm, Right Upper   Resp: 20   SpO2: 98%   PainSc: Eight   Weight: 101.1 kg (222 lb 12.8 oz)   Height: 170.2 cm (5' 7)        Pain Score: Eight  Body mass index is 34.9 kg/m?Marland Kitchen  General: 60 y.o. female appears stated age, in no acute distress  HEENT: Normocephalic, atraumatic  Neck: No thyroidmegaly  Cardiovascular: Well perfused  Pulmonary: Unlabored respirations  Extremities: No cyanosis, clubbing, or edema  Skin: No lesions seen on exposed skin  Psychiatric:  Appropriate mood and affect  Musculoskeletal: No atrophy.  Spinal cord stimulator lead insertion exposed and no longer covered with sterile dressing.  There does not appear to be any erythema, drainage, or fluctuance at the insertion of the leads.  Neurologic: Antigravity strength in all extremities. CN II -XII grossly intact.  Alert and oriented x 3.     Radiographs from 02/01/2021 were personally reviewed and demonstrated spinal cord stimulator leads over the T10-T11 disc space.       IMPRESSION:  1. Fall, initial encounter    2. Chronic bilateral low back pain without sciatica        PLAN:    Discussed with patient she has had lead migration.  [employer] representative present today to reprogram stimulator.  Cleaned the exposed area and redressed.  She will follow-up with [place] next week and pending her response may consider lengthening her trial.

## 2021-02-06 ENCOUNTER — Encounter: Admit: 2021-02-06 | Discharge: 2021-02-06 | Payer: MEDICARE

## 2021-02-06 ENCOUNTER — Ambulatory Visit: Admit: 2021-02-06 | Discharge: 2021-02-07 | Payer: MEDICARE

## 2021-02-06 DIAGNOSIS — M797 Fibromyalgia: Secondary | ICD-10-CM

## 2021-02-06 DIAGNOSIS — J189 Pneumonia, unspecified organism: Secondary | ICD-10-CM

## 2021-02-06 DIAGNOSIS — E785 Hyperlipidemia, unspecified: Secondary | ICD-10-CM

## 2021-02-06 DIAGNOSIS — G5600 Carpal tunnel syndrome, unspecified upper limb: Secondary | ICD-10-CM

## 2021-02-06 DIAGNOSIS — M545 Chronic bilateral low back pain without sciatica: Secondary | ICD-10-CM

## 2021-02-06 DIAGNOSIS — K219 Gastro-esophageal reflux disease without esophagitis: Secondary | ICD-10-CM

## 2021-02-06 DIAGNOSIS — Z8679 Personal history of other diseases of the circulatory system: Secondary | ICD-10-CM

## 2021-02-06 DIAGNOSIS — F419 Anxiety disorder, unspecified: Secondary | ICD-10-CM

## 2021-02-06 DIAGNOSIS — R296 Repeated falls: Secondary | ICD-10-CM

## 2021-02-06 DIAGNOSIS — I10 Essential (primary) hypertension: Secondary | ICD-10-CM

## 2021-02-06 DIAGNOSIS — M4802 Spinal stenosis, cervical region: Secondary | ICD-10-CM

## 2021-02-06 DIAGNOSIS — R269 Unspecified abnormalities of gait and mobility: Secondary | ICD-10-CM

## 2021-02-06 DIAGNOSIS — Z86010 Personal history of colonic polyps: Secondary | ICD-10-CM

## 2021-02-06 DIAGNOSIS — G959 Disease of spinal cord, unspecified: Secondary | ICD-10-CM

## 2021-02-06 DIAGNOSIS — Z8614 Personal history of Methicillin resistant Staphylococcus aureus infection: Secondary | ICD-10-CM

## 2021-02-06 DIAGNOSIS — K5900 Constipation, unspecified: Secondary | ICD-10-CM

## 2021-02-06 DIAGNOSIS — M5020 Other cervical disc displacement, unspecified cervical region: Secondary | ICD-10-CM

## 2021-02-06 MED ORDER — WALKER
0 refills | Status: AC
Start: 2021-02-06 — End: ?

## 2021-02-06 NOTE — Progress Notes
SPINE CENTER CLINIC NOTE       SUBJECTIVE:   Kristen Vaughn is a 60 y.o.-year-old female with history of ACDF, who presents for follow-up for second SCS trial.  The patient was last seen in clinic on 02/01/21 during her spinal cord stimulator trial placed on 01/30/2021 after a fall 01/30/2021.  Patient had radiographs performed and showed lead migration.  Her dressing was also displaced and this was reapplied.  Since that time the patient has had 0% relief in her pain.  She presents today to have the leads pulled.  She has questions about her treatment plan.  She continues to take Lyrica, Celebrex, tizanidine, and Cymbalta.  She is unsure if these are providing any relief.  She also reports she continues to have frequent falls.  She reports her left leg buckles underneath her intermittently and causes her to fall.  She currently utilizes a cane but does not  make her feel more steady.  She also has previously participated in aquatic therapy with some benefit.  She would like orders to repeat this at this time.  VAS pain score today is rated an 8 out of 10.  She denies any loss of bowel or bladder function.        Review of Systems    Current Outpatient Medications:   ?  acetaminophen (TYLENOL) 500 mg tablet, Take two tablets by mouth every 6 hours as needed for Pain. Max of 4,000 mg of acetaminophen in 24 hours., Disp: 500 tablet, Rfl: 1  ?  albuterol sulfate (PROAIR HFA) 90 mcg/actuation HFA aerosol inhaler, Inhale 2 Puffs by mouth into the lungs every 6 hours as needed for Wheezing or Shortness of Breath. Shake well before use., Disp: , Rfl:   ?  AMITR/GABAPEN/EMU OIL 12/26/08% CREAM (COMPOUND), Apply 0.5grams to affected area three times daily PRN for pain, Disp: 50 g, Rfl: 3  ?  aspirin EC 81 mg tablet, Take 81 mg by mouth daily. Take with food., Disp: , Rfl:   ?  busPIRone (BUSPAR) 5 mg tablet, Take 10 mg by mouth twice daily., Disp: , Rfl:   ?  calcium carbonate (CALCIUM 600 PO), Take 1 tablet by mouth daily., Disp: , Rfl:   ?  celecoxib (CELEBREX) 200 mg capsule, Take 200 mg by mouth daily., Disp: , Rfl:   ?  cetirizine (ZYRTEC) 10 mg tablet, Take 10 mg by mouth daily as needed for Allergy symptoms., Disp: , Rfl:   ?  Cholecalciferol (Vitamin D3) (VITAMIN D-3) 2,000 unit cap, Take two capsules by mouth daily. (Patient taking differently: Take 1 capsule by mouth daily.), Disp: 300 capsule, Rfl: 11  ?  clonazePAM (KLONOPIN) 0.5 mg tablet, Take 0.5 mg by mouth daily as needed., Disp: , Rfl:   ?  denosumab (PROLIA) 60 mg/mL injection, Inject 60 mg under the skin every 180 days., Disp: , Rfl:   ?  diclofenac (VOLTAREN) 1 % topical gel, Apply 4 g topically to affected area four times daily., Disp: , Rfl:   ?  duloxetine DR (CYMBALTA) 60 mg capsule, Take 60 mg by mouth twice daily., Disp: , Rfl:   ?  fish oil- omega 3-DHA/EPA 300/1,000 mg capsule, Take 1 capsule by mouth daily., Disp: , Rfl:   ?  ketoconazole (NIZORAL) 2 % topical cream, Apply  topically to affected area twice daily., Disp: 60 g, Rfl: 11  ?  lisinopril (PRINIVIL; ZESTRIL) 10 mg tablet, Take 10 mg by mouth daily., Disp: , Rfl:   ?  mupirocin (BACTROBAN) 2 % topical ointment, Apply  topically to affected area twice daily., Disp: 22 g, Rfl: 11  ?  omeprazole DR(+) (PRILOSEC) 40 mg capsule, Take 40 mg by mouth daily before breakfast., Disp: , Rfl:   ?  pregabalin (LYRICA) 225 mg capsule, TAKE 1 CAPSULE BY MOUTH TWICE A DAY, Disp: 60 capsule, Rfl: 5  ?  QUEtiapine (SEROQUEL) 25 mg tablet, Take 25 mg by mouth at bedtime daily., Disp: , Rfl:   ?  REXULTI 1 mg tablet, TAKE ONE (1) TABLET BY MOUTH DAILY FOR MOOD, Disp: , Rfl:   ?  rosuvastatin (CRESTOR) 10 mg tablet, TAKE ONE TABLET BY MOUTH DAILY. INDICATIONS: EXCESSIVE FAT IN THE BLOOD, Disp: 90 tablet, Rfl: 3  ?  tiZANidine (ZANAFLEX) 4 mg tablet, Take 4 mg by mouth every 6 hours as needed., Disp: , Rfl:   ?  triamcinolone acetonide (KENALOG) 0.1 % topical ointment, Apply  topically to affected area twice daily. Mix triamcinolone ointment in a 1:1 ratio with mupirocin. Apply to lesions daily and cover for 2-3 weeks until resolved, Disp: 15 g, Rfl: 11  ?  walker (ULTRA-LIGHT ROLLATOR) medical supply, Use as directed., Disp: 1 each, Rfl: 0    Current Facility-Administered Medications:   ?  diazePAM (VALIUM) tablet 5 mg, 5 mg, Oral, PRN, [name], MD, 5 mg at 04/27/19 1429  Allergies   Allergen Reactions   ? Ascorbic Acid UNKNOWN   ? Erythromycin RASH     Rash on trunk   ? Strawberry UNKNOWN     Physical Exam  Vitals:    02/06/21 1304   BP: (!) 138/107   BP Source: Arm, Right Upper   Pulse: 81   Resp: 20   SpO2: 98%   PainSc: Eight   Weight: 100.7 kg (222 lb)   Height: 170.2 cm (5' 7)        Pain Score: Eight  Body mass index is 34.77 kg/m?Marland Kitchen  General: 60 y.o. female appears stated age, in no acute distress  HEENT: Normocephalic, atraumatic  Neck: No thyroidmegaly  Cardiovascular: Well perfused  Pulmonary: Unlabored respirations  Extremities: No cyanosis, clubbing, or edema  Skin: No lesions seen on exposed skin  Psychiatric:  Appropriate mood and affect  Musculoskeletal: No atrophy.  Spinal cord stimulator lead insertion site without significant erythema, minimal tenderness, no purulence or drainage from sites.  Neurologic: Antigravity strength in all extremities. CN II -XII grossly intact.  Alert and oriented x 3.        IMPRESSION:  1. Chronic bilateral low back pain without sciatica    2. Gait difficulty    3. Frequent falls        PLAN:    1.  Lifestyle modifications.  Recommend activity as tolerated.  Avoid provocative maneuvers.  Keep spine in neutral position.  2.  Medications.  We will discuss additional medication options with Dr. Katrinka Blazing.  I would continue with current medications.  No changes indicated at this time.  3.  Therapy.  Patient provided prescription for formalized aquatic therapy today.  She was also provided a prescription for Rollator walker.  4.  Imaging.  We will discuss with Dr. Katrinka Blazing.  5. Interventions.  Pulled spinal cord stimulator leads today.  We will discuss further interventions with Dr. Katrinka Blazing.  6.  Follow-up.  Patient to follow-up as needed.

## 2021-02-07 DIAGNOSIS — G8929 Other chronic pain: Secondary | ICD-10-CM

## 2021-02-08 ENCOUNTER — Encounter: Admit: 2021-02-08 | Discharge: 2021-02-08 | Payer: MEDICARE

## 2021-02-08 DIAGNOSIS — J449 Chronic obstructive pulmonary disease, unspecified: Secondary | ICD-10-CM

## 2021-02-08 NOTE — Telephone Encounter
02/08/2021 4:44 PM Called patient to review stress test instructions. She stated she cannot make the stress test appointment on 02/13/21, as she starts water PT.  Offered her 02/10/21 at [place] or [place].  Patient would like to complete stress test at [facility], as she lives closer to that clinic.  Scheduled her for 02/10/21 at 10 AM.    Reviewed meds to hold (OTC V&S), no caffeine for 24 hours prior to test, nothing to eat or drink after midnight.  Patient may take prescription medications with sips of water on the morning of the test.  Also reminded this is a two part test. Pt voiced understanding.    Will send patient a my chart message with instructions and address of [facility].

## 2021-02-09 ENCOUNTER — Encounter: Admit: 2021-02-09 | Discharge: 2021-02-09 | Payer: MEDICARE

## 2021-02-09 DIAGNOSIS — M792 Neuralgia and neuritis, unspecified: Secondary | ICD-10-CM

## 2021-02-09 MED ORDER — PREGABALIN 225 MG PO CAP
ORAL_CAPSULE | Freq: Two times a day (BID) | 0 refills
Start: 2021-02-09 — End: ?

## 2021-02-10 ENCOUNTER — Encounter: Admit: 2021-02-10 | Discharge: 2021-02-10 | Payer: MEDICARE

## 2021-02-10 DIAGNOSIS — G5732 Lesion of lateral popliteal nerve, left lower limb: Secondary | ICD-10-CM

## 2021-02-10 DIAGNOSIS — Z720 Tobacco use: Secondary | ICD-10-CM

## 2021-02-10 DIAGNOSIS — G959 Disease of spinal cord, unspecified: Secondary | ICD-10-CM

## 2021-02-10 DIAGNOSIS — M79605 Pain in left leg: Secondary | ICD-10-CM

## 2021-02-10 DIAGNOSIS — M4802 Spinal stenosis, cervical region: Secondary | ICD-10-CM

## 2021-02-10 DIAGNOSIS — E782 Mixed hyperlipidemia: Secondary | ICD-10-CM

## 2021-02-10 DIAGNOSIS — Z8249 Family history of ischemic heart disease and other diseases of the circulatory system: Secondary | ICD-10-CM

## 2021-02-10 DIAGNOSIS — Z9989 Dependence on other enabling machines and devices: Secondary | ICD-10-CM

## 2021-02-10 DIAGNOSIS — M5020 Other cervical disc displacement, unspecified cervical region: Secondary | ICD-10-CM

## 2021-02-10 MED ORDER — SODIUM CHLORIDE 0.9 % IV SOLP
250 mL | INTRAVENOUS | 0 refills | Status: AC | PRN
Start: 2021-02-10 — End: ?

## 2021-02-10 MED ORDER — ALBUTEROL SULFATE 90 MCG/ACTUATION IN HFAA
2 | RESPIRATORY_TRACT | 0 refills | Status: AC | PRN
Start: 2021-02-10 — End: ?

## 2021-02-10 MED ORDER — EUCALYPTUS-MENTHOL MM LOZG
1 | Freq: Once | ORAL | 0 refills | Status: AC | PRN
Start: 2021-02-10 — End: ?

## 2021-02-10 MED ORDER — REGADENOSON 0.4 MG/5 ML IV SYRG
.4 mg | Freq: Once | INTRAVENOUS | 0 refills | Status: CP
Start: 2021-02-10 — End: ?

## 2021-02-10 MED ORDER — NITROGLYCERIN 0.4 MG SL SUBL
.4 mg | SUBLINGUAL | 0 refills | Status: AC | PRN
Start: 2021-02-10 — End: ?

## 2021-02-10 MED ORDER — AMINOPHYLLINE 500 MG/20 ML IV SOLN
50 mg | INTRAVENOUS | 0 refills | Status: AC | PRN
Start: 2021-02-10 — End: ?

## 2021-02-10 MED ORDER — RP DX TC-99M TETROFOSMIN MCI
9 | Freq: Once | INTRAVENOUS | 0 refills | Status: CP
Start: 2021-02-10 — End: ?

## 2021-02-10 MED ORDER — RP DX TC-99M TETROFOSMIN MCI
26 | Freq: Once | INTRAVENOUS | 0 refills | Status: CP
Start: 2021-02-10 — End: ?

## 2021-02-28 NOTE — Patient Instructions
It was a pleasure seeing you in clinic today.    Tiago Humphrey RN, CNC  Dr. Joshua T. Bunch/Ortho Spine Surgery  [facility]  [facility]  [address]  [place], [place] [address]  Phone: [phone]   Fax [phone]  Scheduling [phone]  [email or web address]    We appreciate the interest in your health.  The physician reviews both the radiology reports and the imaging independently and will contact you if there are any emergent findings.  If you would like, incidental and chronic changes seen in your images can be reviewed with Dr. Bunch at a follow up appointment.  At that appointment, the radiology report and terminology used in that report can also be explained. Please call our Schedule line if you would like to follow up.

## 2021-03-01 ENCOUNTER — Encounter: Admit: 2021-03-01 | Discharge: 2021-03-01 | Payer: MEDICARE

## 2021-03-01 ENCOUNTER — Ambulatory Visit: Admit: 2021-03-01 | Discharge: 2021-03-01 | Payer: MEDICARE

## 2021-03-01 DIAGNOSIS — E785 Hyperlipidemia, unspecified: Secondary | ICD-10-CM

## 2021-03-01 DIAGNOSIS — M5416 Radiculopathy, lumbar region: Secondary | ICD-10-CM

## 2021-03-01 DIAGNOSIS — I10 Essential (primary) hypertension: Secondary | ICD-10-CM

## 2021-03-01 DIAGNOSIS — G5600 Carpal tunnel syndrome, unspecified upper limb: Secondary | ICD-10-CM

## 2021-03-01 DIAGNOSIS — M4802 Spinal stenosis, cervical region: Secondary | ICD-10-CM

## 2021-03-01 DIAGNOSIS — M4316 Spondylolisthesis, lumbar region: Secondary | ICD-10-CM

## 2021-03-01 DIAGNOSIS — J189 Pneumonia, unspecified organism: Secondary | ICD-10-CM

## 2021-03-01 DIAGNOSIS — G959 Disease of spinal cord, unspecified: Secondary | ICD-10-CM

## 2021-03-01 DIAGNOSIS — M797 Fibromyalgia: Secondary | ICD-10-CM

## 2021-03-01 DIAGNOSIS — Z981 Arthrodesis status: Secondary | ICD-10-CM

## 2021-03-01 DIAGNOSIS — Z8679 Personal history of other diseases of the circulatory system: Secondary | ICD-10-CM

## 2021-03-01 DIAGNOSIS — Z86010 Personal history of colonic polyps: Secondary | ICD-10-CM

## 2021-03-01 DIAGNOSIS — M5020 Other cervical disc displacement, unspecified cervical region: Secondary | ICD-10-CM

## 2021-03-01 DIAGNOSIS — F419 Anxiety disorder, unspecified: Secondary | ICD-10-CM

## 2021-03-01 DIAGNOSIS — K219 Gastro-esophageal reflux disease without esophagitis: Secondary | ICD-10-CM

## 2021-03-01 DIAGNOSIS — Z8614 Personal history of Methicillin resistant Staphylococcus aureus infection: Secondary | ICD-10-CM

## 2021-03-01 DIAGNOSIS — K5900 Constipation, unspecified: Secondary | ICD-10-CM

## 2021-03-14 ENCOUNTER — Encounter: Admit: 2021-03-14 | Discharge: 2021-03-14 | Payer: MEDICARE

## 2021-03-14 DIAGNOSIS — M7918 Myalgia, other site: Secondary | ICD-10-CM

## 2021-03-14 NOTE — Telephone Encounter
patient calling requesting Rx for TENS unit for her back. Reports she needs RX, demographics and clinical notes sent to [facility] at fax [phone]. routing to APRN for review.

## 2021-03-16 ENCOUNTER — Encounter: Admit: 2021-03-16 | Discharge: 2021-03-16 | Payer: MEDICARE

## 2021-03-22 ENCOUNTER — Encounter: Admit: 2021-03-22 | Discharge: 2021-03-22 | Payer: MEDICARE

## 2021-03-22 ENCOUNTER — Ambulatory Visit: Admit: 2021-03-22 | Discharge: 2021-03-22 | Payer: MEDICARE

## 2021-03-22 MED ORDER — LIDOCAINE 5 % TP PTMD
1 | MEDICATED_PATCH | Freq: Every day | TOPICAL | 3 refills | 30.00000 days | Status: AC
Start: 2021-03-22 — End: ?

## 2021-03-22 NOTE — Progress Notes
SPINE CENTER CLINIC NOTE       SUBJECTIVE:   Kristen Vaughn is a 60 y.o.-year-old female with history of ACDF and two Nevro spinal cord stimulator trials, who presents for scheduled follow up for bilateral low back and gluteal pain. Patient was recently seen by Dr. Liam Vaughn who ordered advanced imaging. At our last office visit we ordered an EMG that is scheduled in August. She has also been participating in aquatic therapy but has had to stop due to worsening pain when getting out of the pool. Pain is in the low back but she is having significantly more pain in bilateral gluteal muscles and posterior thighs. She states the buttock pain worsening in February/March and has been worsening since then.  She is unable to sit, bend forward, or stand without excruciating pain. She has been taking Lyrica 200mg  three times a day but unsure this is helping. She is taking Tylenol arthritis and OTC NSAIDs without relief. VAS pain score is rated as a 8/10. Denies loss of bowel or bladder function. Denies any falls since her last visit.       Review of Systems    Current Outpatient Medications:   ?  acetaminophen (TYLENOL) 500 mg tablet, Take two tablets by mouth every 6 hours as needed for Pain. Max of 4,000 mg of acetaminophen in 24 hours., Disp: 500 tablet, Rfl: 1  ?  albuterol sulfate (PROAIR HFA) 90 mcg/actuation HFA aerosol inhaler, Inhale 2 Puffs by mouth into the lungs every 6 hours as needed for Wheezing or Shortness of Breath. Shake well before use., Disp: , Rfl:   ?  AMITR/GABAPEN/EMU OIL 12/26/08% CREAM (COMPOUND), Apply 0.5grams to affected area three times daily PRN for pain, Disp: 50 g, Rfl: 3  ?  aspirin EC 81 mg tablet, Take 81 mg by mouth daily. Take with food., Disp: , Rfl:   ?  busPIRone (BUSPAR) 5 mg tablet, Take 10 mg by mouth twice daily., Disp: , Rfl:   ?  calcium carbonate (CALCIUM 600 PO), Take 1 tablet by mouth daily., Disp: , Rfl:   ?  celecoxib (CELEBREX) 200 mg capsule, Take 200 mg by mouth daily., Disp: , Rfl:   ?  cetirizine (ZYRTEC) 10 mg tablet, Take 10 mg by mouth daily as needed for Allergy symptoms., Disp: , Rfl:   ?  Cholecalciferol (Vitamin D3) (VITAMIN D-3) 2,000 unit cap, Take two capsules by mouth daily. (Patient taking differently: Take 1 capsule by mouth daily.), Disp: 300 capsule, Rfl: 11  ?  clonazePAM (KLONOPIN) 0.5 mg tablet, Take 0.5 mg by mouth daily as needed., Disp: , Rfl:   ?  denosumab (PROLIA) 60 mg/mL injection, Inject 60 mg under the skin every 180 days., Disp: , Rfl:   ?  diclofenac (VOLTAREN) 1 % topical gel, Apply 4 g topically to affected area four times daily., Disp: , Rfl:   ?  duloxetine DR (CYMBALTA) 60 mg capsule, Take 60 mg by mouth twice daily., Disp: , Rfl:   ?  fish oil- omega 3-DHA/EPA 300/1,000 mg capsule, Take 1 capsule by mouth daily., Disp: , Rfl:   ?  HYDROcodone/acetaminophen (NORCO) 5/325 mg tablet, Take 1 tablet by mouth every 6 hours as needed for Pain., Disp: , Rfl:   ?  ketoconazole (NIZORAL) 2 % topical cream, Apply  topically to affected area twice daily., Disp: 60 g, Rfl: 11  ?  lidocaine (LIDODERM) 5 % topical patch, Apply one patch topically to affected area daily. Apply patch for  12 hours, then remove for 12 hours before repeating., Disp: 90 patch, Rfl: 3  ?  lisinopril (PRINIVIL; ZESTRIL) 10 mg tablet, Take 10 mg by mouth daily., Disp: , Rfl:   ?  mupirocin (BACTROBAN) 2 % topical ointment, Apply  topically to affected area twice daily., Disp: 22 g, Rfl: 11  ?  omeprazole DR(+) (PRILOSEC) 40 mg capsule, Take 40 mg by mouth daily before breakfast., Disp: , Rfl:   ?  pregabalin (LYRICA) 200 mg capsule, Take one capsule by mouth three times daily., Disp: 90 capsule, Rfl: 1  ?  QUEtiapine (SEROQUEL) 25 mg tablet, Take 25 mg by mouth at bedtime daily., Disp: , Rfl:   ?  REXULTI 1 mg tablet, TAKE ONE (1) TABLET BY MOUTH DAILY FOR MOOD, Disp: , Rfl:   ?  rosuvastatin (CRESTOR) 10 mg tablet, TAKE ONE TABLET BY MOUTH DAILY. INDICATIONS: EXCESSIVE FAT IN THE BLOOD, Disp: 90 tablet, Rfl: 3  ?  tiZANidine (ZANAFLEX) 4 mg tablet, Take 4 mg by mouth every 6 hours as needed., Disp: , Rfl:   ?  triamcinolone acetonide (KENALOG) 0.1 % topical ointment, Apply  topically to affected area twice daily. Mix triamcinolone ointment in a 1:1 ratio with mupirocin. Apply to lesions daily and cover for 2-3 weeks until resolved, Disp: 15 g, Rfl: 11  ?  walker (ULTRA-LIGHT ROLLATOR) medical supply, Use as directed., Disp: 1 each, Rfl: 0    Current Facility-Administered Medications:   ?  diazePAM (VALIUM) tablet 5 mg, 5 mg, Oral, PRN, [name], MD, 5 mg at 04/27/19 1429  Allergies   Allergen Reactions   ? Ascorbic Acid UNKNOWN   ? Erythromycin RASH     Rash on trunk   ? Strawberry UNKNOWN     Physical Exam  Vitals:    03/22/21 1319   BP: 104/73   BP Source: Arm, Right Upper   Pulse: 58   Resp: 20   SpO2: 98%   PainSc: Eight   Weight: 98.4 kg (217 lb)   Height: 170.2 cm (5' 7)        Pain Score: Eight  Body mass index is 33.99 kg/m?Marland Kitchen  General: 60 y.o. female appears stated age, in no acute distress  HEENT: Normocephalic, atraumatic  Neck: No thyroidmegaly  Cardiovascular: Well perfused  Pulmonary: Unlabored respirations  Extremities: No cyanosis, clubbing, or edema  Skin: Warm and dry  Psychiatric:  Patient tearful throughout exam  Musculoskeletal: She is able to ambulate with Rollator walker. Decreased range of motion with lumbar flexion, extension, and lateral rotation.  Tender to palpation greatest at bilateral gluteus medius and maximus as well as proximal insertion of the hamstring at the ischium. She has pain and weakness with bilateral resisted knee flexion. Tenderness to palpation at bilateral L4-L5 and L5-S1 facets as well as sacral border. Facet loading is positive  bilaterally.    Neurologic: Lower extremity myotomes are all antigravity but with some giveaway weakness.  Lower extremity dermatomes are all intact to light touch.  Deep tendon reflexes are symmetric at patella and achilles.  Slump test uncomfortable on the left but not grossly positive. Downward Babinski.  No ankle clonus.        IMPRESSION:  1. Chronic bilateral low back pain without sciatica    2. Gluteal pain    3. Lumbar radiculopathy        PLAN:    1.  Lifestyle modifications.  Recommend activity as tolerated.  Avoid provocative maneuvers.  Keep spine in  neutral position.  2.  Medications.  Discussed with patient would recommend weaning down on Lyrica if it is not providing relief. Discussed other options such as LDN. She would like to defer until after imaging. Patient provided prescription for lidocaine patches today. No other changes indicated at this time. Medication usage and safety reviewed.  3.  Therapy.  She has been trying to participate in formal aquatic therapy but has been limited due to pain.   4.  Imaging.  She is scheduled for MRI of cervical and lumbar spine as well as EMG.   5.  Interventions. Will defer until after imaging. May consider ischiogluteal bursa injections.  6.  Follow-up.  Patient to follow-up after imaging and EMG.    Total Time Today was 35 minutes in the following activities: Preparing to see the patient, Obtaining and/or reviewing separately obtained history, Performing a medically appropriate examination and/or evaluation, Counseling and educating the patient/family/caregiver, Ordering medications, tests, or procedures and Documenting clinical information in the electronic or other health record.

## 2021-03-24 ENCOUNTER — Encounter: Admit: 2021-03-24 | Discharge: 2021-03-24 | Payer: MEDICARE

## 2021-04-04 ENCOUNTER — Encounter: Admit: 2021-04-04 | Discharge: 2021-04-04 | Payer: MEDICARE

## 2021-04-04 MED ORDER — TRIAMCINOLONE ACETONIDE 0.1 % TP OINT
Freq: Two times a day (BID) | 11 refills
Start: 2021-04-04 — End: ?

## 2021-04-04 MED ORDER — KETOCONAZOLE 2 % TP CREA
Freq: Two times a day (BID) | 11 refills
Start: 2021-04-04 — End: ?

## 2021-04-10 ENCOUNTER — Encounter: Admit: 2021-04-10 | Discharge: 2021-04-10 | Payer: MEDICARE

## 2021-04-10 NOTE — Telephone Encounter
Patient left message asking for call back related to MRI, reviewed and MRI scheduled called patient, no answer, left message to call back or send mychart message with questions.

## 2021-04-14 ENCOUNTER — Encounter: Admit: 2021-04-14 | Discharge: 2021-04-14 | Payer: MEDICARE

## 2021-04-14 ENCOUNTER — Emergency Department: Admit: 2021-04-14 | Discharge: 2021-04-14 | Discharge status: Against Medical Advice | Payer: MEDICARE

## 2021-04-14 DIAGNOSIS — G5600 Carpal tunnel syndrome, unspecified upper limb: Secondary | ICD-10-CM

## 2021-04-14 DIAGNOSIS — G959 Disease of spinal cord, unspecified: Secondary | ICD-10-CM

## 2021-04-14 DIAGNOSIS — M5020 Other cervical disc displacement, unspecified cervical region: Secondary | ICD-10-CM

## 2021-04-14 DIAGNOSIS — I10 Essential (primary) hypertension: Secondary | ICD-10-CM

## 2021-04-14 DIAGNOSIS — Z8679 Personal history of other diseases of the circulatory system: Secondary | ICD-10-CM

## 2021-04-14 DIAGNOSIS — M797 Fibromyalgia: Secondary | ICD-10-CM

## 2021-04-14 DIAGNOSIS — M4802 Spinal stenosis, cervical region: Secondary | ICD-10-CM

## 2021-04-14 DIAGNOSIS — K5900 Constipation, unspecified: Secondary | ICD-10-CM

## 2021-04-14 DIAGNOSIS — K219 Gastro-esophageal reflux disease without esophagitis: Secondary | ICD-10-CM

## 2021-04-14 DIAGNOSIS — J189 Pneumonia, unspecified organism: Secondary | ICD-10-CM

## 2021-04-14 DIAGNOSIS — F419 Anxiety disorder, unspecified: Secondary | ICD-10-CM

## 2021-04-14 DIAGNOSIS — E785 Hyperlipidemia, unspecified: Secondary | ICD-10-CM

## 2021-04-14 DIAGNOSIS — Z8614 Personal history of Methicillin resistant Staphylococcus aureus infection: Secondary | ICD-10-CM

## 2021-04-14 DIAGNOSIS — Z86010 Personal history of colonic polyps: Secondary | ICD-10-CM

## 2021-04-14 NOTE — ED Notes
This RN was notified that pt would like to see RN. When RN available and entered pt room, pt and belongings gone.

## 2021-04-14 NOTE — ED Notes
60 y/o female presents to ED 13 with cc of increased lower back pain 10/10 that starts at bilateral hips and radiates posteriorly to her groin region. She is anxious at this time, concerned she is having a panic attack. She has been unable to sit, lay on her back, or stand since this started 2 days ago. She reports the pain does not usually radiate to her legs, but sometimes she feels weakness in her legs and unable to stand on them. She reports hx of advanced osteoporosis. She is AAOx4. VSS. Answers questions appropriately. Respirations even and unlabored, skin warm/dry/intact. Pt resting comfortably on cart, call light in reach. Cardiac, BP, SPO2 monitor on pt at this time. RN awaiting further orders from team.          Medical History:   Diagnosis Date    Anxiety and depression     Carpal tunnel syndrome     Cervical myelopathy (HCC)     Cervical stenosis of spine     Chest pain     Constipation     Essential hypertension     Fibromyalgia     GERD (gastroesophageal reflux disease)     Herniated disc, cervical     History of abnormal electrocardiogram 01/2016    History of colon polyps 2015    History of MRSA infection 2019    right hand/[facility]    Hyperlipemia 08/09/2009    Pneumonia        Patient belongings: shirt, pants, shoes, walker

## 2021-04-14 NOTE — Telephone Encounter
Patient calling to report she is still having a lot of pain and more difficulty standing, pt tearful says she has someone bringing her to the ER discussed this was the best idea for them to be able to evaluate her and see what is going on, pt agreed and asked to notify Dr Liam Rogers and Dr Katrinka Blazing, routing to both.

## 2021-04-17 ENCOUNTER — Encounter: Admit: 2021-04-17 | Discharge: 2021-04-17 | Payer: MEDICARE

## 2021-04-17 ENCOUNTER — Ambulatory Visit: Admit: 2021-04-17 | Discharge: 2021-04-18 | Payer: MEDICARE

## 2021-04-17 DIAGNOSIS — Z86010 Personal history of colonic polyps: Secondary | ICD-10-CM

## 2021-04-17 DIAGNOSIS — E785 Hyperlipidemia, unspecified: Secondary | ICD-10-CM

## 2021-04-17 DIAGNOSIS — K5900 Constipation, unspecified: Secondary | ICD-10-CM

## 2021-04-17 DIAGNOSIS — G5600 Carpal tunnel syndrome, unspecified upper limb: Secondary | ICD-10-CM

## 2021-04-17 DIAGNOSIS — Z8679 Personal history of other diseases of the circulatory system: Secondary | ICD-10-CM

## 2021-04-17 DIAGNOSIS — I10 Essential (primary) hypertension: Secondary | ICD-10-CM

## 2021-04-17 DIAGNOSIS — M5020 Other cervical disc displacement, unspecified cervical region: Secondary | ICD-10-CM

## 2021-04-17 DIAGNOSIS — M5416 Radiculopathy, lumbar region: Secondary | ICD-10-CM

## 2021-04-17 DIAGNOSIS — Z8614 Personal history of Methicillin resistant Staphylococcus aureus infection: Secondary | ICD-10-CM

## 2021-04-17 DIAGNOSIS — F419 Anxiety disorder, unspecified: Secondary | ICD-10-CM

## 2021-04-17 DIAGNOSIS — J189 Pneumonia, unspecified organism: Secondary | ICD-10-CM

## 2021-04-17 DIAGNOSIS — M4802 Spinal stenosis, cervical region: Secondary | ICD-10-CM

## 2021-04-17 DIAGNOSIS — M797 Fibromyalgia: Secondary | ICD-10-CM

## 2021-04-17 DIAGNOSIS — G959 Disease of spinal cord, unspecified: Secondary | ICD-10-CM

## 2021-04-17 DIAGNOSIS — K219 Gastro-esophageal reflux disease without esophagitis: Secondary | ICD-10-CM

## 2021-04-17 DIAGNOSIS — M7918 Myalgia, other site: Secondary | ICD-10-CM

## 2021-04-17 DIAGNOSIS — M545 Chronic bilateral low back pain without sciatica: Secondary | ICD-10-CM

## 2021-04-17 MED ORDER — ACETAMINOPHEN-CODEINE 300-30 MG PO TAB
1 | ORAL_TABLET | ORAL | 1 refills | 3.00000 days | Status: AC | PRN
Start: 2021-04-17 — End: ?

## 2021-04-17 MED ORDER — NALOXONE 4 MG/ACTUATION NA SPRY
4 mg | Freq: Once | NASAL | 0 refills | 1.00000 days | Status: AC
Start: 2021-04-17 — End: ?

## 2021-04-17 NOTE — Progress Notes
Obtained patient's verbal consent to treat them and their agreement to [facility] financial policy and NPP via this telehealth visit during the [facility]    SPINE CENTER CLINIC NOTE       SUBJECTIVE:   Kristen Vaughn is a 60 y.o.-year-old female with history of ACDF and two Nevro spinal cord stimulator trials, who presents for evaluation of worsening back and sacral pain. She was recently seen by Dr. Liam Rogers who recommended updated advanced imaging. She was scheduled for this and unfortunately had to cancel and reschedule. She is scheduled to have an MRI of cervical and lumbar spine on 04/26/21. She presented to the [facility] emergency room on 04/14/21 and left without being seen. She then went to [facility] underwent radiographs and given a prescription for Percocet. She states she is unable to tolerate the Percocet as this makes her legs feel weak. She states she is unable to sit or stand due to pain. VAS pain score is rated as a 10/10. Denies loss of bowel or bladder function. Denies recent falls.        Review of Systems    Current Outpatient Medications:   ?  acetaminophen (TYLENOL) 500 mg tablet, Take two tablets by mouth every 6 hours as needed for Pain. Max of 4,000 mg of acetaminophen in 24 hours., Disp: 500 tablet, Rfl: 1  ?  acetaminophen-codeine (TYLENOL-CODEINE #3) 300-30 mg tablet, Take one tablet by mouth every 8 hours as needed for Pain. Max of 4,000 mg of acetaminophen in 24 hours., Disp: 60 tablet, Rfl: 1  ?  albuterol sulfate (PROAIR HFA) 90 mcg/actuation HFA aerosol inhaler, Inhale 2 Puffs by mouth into the lungs every 6 hours as needed for Wheezing or Shortness of Breath. Shake well before use., Disp: , Rfl:   ?  AMITR/GABAPEN/EMU OIL 12/26/08% CREAM (COMPOUND), Apply 0.5grams to affected area three times daily PRN for pain, Disp: 50 g, Rfl: 3  ?  aspirin EC 81 mg tablet, Take 81 mg by mouth daily. Take with food., Disp: , Rfl:   ?  busPIRone (BUSPAR) 5 mg tablet, Take 10 mg by mouth twice daily., Disp: , Rfl:   ?  calcium carbonate (CALCIUM 600 PO), Take 1 tablet by mouth daily., Disp: , Rfl:   ?  celecoxib (CELEBREX) 200 mg capsule, Take 200 mg by mouth daily., Disp: , Rfl:   ?  cetirizine (ZYRTEC) 10 mg tablet, Take 10 mg by mouth daily as needed for Allergy symptoms., Disp: , Rfl:   ?  Cholecalciferol (Vitamin D3) (VITAMIN D-3) 2,000 unit cap, Take two capsules by mouth daily. (Patient taking differently: Take 1 capsule by mouth daily.), Disp: 300 capsule, Rfl: 11  ?  clonazePAM (KLONOPIN) 0.5 mg tablet, Take 0.5 mg by mouth daily as needed., Disp: , Rfl:   ?  denosumab (PROLIA) 60 mg/mL injection, Inject 60 mg under the skin every 180 days., Disp: , Rfl:   ?  diclofenac (VOLTAREN) 1 % topical gel, Apply 4 g topically to affected area four times daily., Disp: , Rfl:   ?  duloxetine DR (CYMBALTA) 60 mg capsule, Take 60 mg by mouth twice daily., Disp: , Rfl:   ?  fish oil- omega 3-DHA/EPA 300/1,000 mg capsule, Take 1 capsule by mouth daily., Disp: , Rfl:   ?  HYDROcodone/acetaminophen (NORCO) 5/325 mg tablet, Take 1 tablet by mouth every 6 hours as needed for Pain., Disp: , Rfl:   ?  ketoconazole (NIZORAL) 2 % topical cream, Apply  topically to affected area twice daily., Disp: 60 g, Rfl: 11  ?  lidocaine (LIDODERM) 5 % topical patch, Apply one patch topically to affected area daily. Apply patch for 12 hours, then remove for 12 hours before repeating., Disp: 90 patch, Rfl: 3  ?  lisinopril (PRINIVIL; ZESTRIL) 10 mg tablet, Take 10 mg by mouth daily., Disp: , Rfl:   ?  mupirocin (BACTROBAN) 2 % topical ointment, Apply  topically to affected area twice daily., Disp: 22 g, Rfl: 11  ?  naloxone (NARCAN) 4 mg/actuation nasal spray, Insert one spray into nose as directed once for 1 dose. Insert 1 spray into 1 nostril as needed for signs of opioid overdose then call 911. May repeat dose every 2-3 minutes (alternate nostrils) until medical team arrives.  Indications: opioid overdose, Disp: 2 each, Rfl: 0  ? omeprazole DR(+) (PRILOSEC) 40 mg capsule, Take 40 mg by mouth daily before breakfast., Disp: , Rfl:   ?  pregabalin (LYRICA) 200 mg capsule, Take one capsule by mouth three times daily., Disp: 90 capsule, Rfl: 1  ?  QUEtiapine (SEROQUEL) 25 mg tablet, Take 25 mg by mouth at bedtime daily., Disp: , Rfl:   ?  REXULTI 1 mg tablet, TAKE ONE (1) TABLET BY MOUTH DAILY FOR MOOD, Disp: , Rfl:   ?  rosuvastatin (CRESTOR) 10 mg tablet, TAKE ONE TABLET BY MOUTH DAILY. INDICATIONS: EXCESSIVE FAT IN THE BLOOD, Disp: 90 tablet, Rfl: 3  ?  tiZANidine (ZANAFLEX) 4 mg tablet, Take 4 mg by mouth every 6 hours as needed., Disp: , Rfl:   ?  triamcinolone acetonide (KENALOG) 0.1 % topical ointment, Apply  topically to affected area twice daily. Mix triamcinolone ointment in a 1:1 ratio with mupirocin. Apply to lesions daily and cover for 2-3 weeks until resolved, Disp: 15 g, Rfl: 11  ?  walker (ULTRA-LIGHT ROLLATOR) medical supply, Use as directed., Disp: 1 each, Rfl: 0    Current Facility-Administered Medications:   ?  diazePAM (VALIUM) tablet 5 mg, 5 mg, Oral, PRN, [name], MD, 5 mg at 04/27/19 1429  Allergies   Allergen Reactions   ? Ascorbic Acid UNKNOWN   ? Erythromycin RASH     Rash on trunk   ? Strawberry UNKNOWN     Physical Exam  Vitals:    04/17/21 1035   PainSc: Ten   Weight: 97.5 kg (215 lb)   Height: 170.2 cm (5' 7)        Pain Score: Ten  Body mass index is 33.67 kg/m?Marland Kitchen  General: 60 y.o. female appears stated age, in no acute distress  HEENT: Normocephalic, atraumatic  Neck: No thyroidmegaly  Cardiovascular: Well perfused  Pulmonary: Unlabored respirations  Extremities: No cyanosis, clubbing, or edema  Skin: No lesions seen on exposed skin  Psychiatric:  Appropriate mood and affect  Musculoskeletal: No atrophy.   Neurologic: Antigravity strength in all extremities. CN II -XII grossly intact.  Alert and oriented x 3.        IMPRESSION:  1. Chronic bilateral low back pain without sciatica    2. Gluteal pain    3. Lumbar radiculopathy        PLAN:    1.  Lifestyle modifications.  Recommend activity as tolerated.  Avoid provocative maneuvers.  Keep spine in neutral position.  2.  Medications.  Patient provided a prescription for Tylenol #3 today to take for instances of severe pain. She will discontinue Percocet. No other changes indicated at this time.  3.  Therapy.  Will defer until after imaging.   4.  Imaging.  Patient is scheduled for MRI on 04/26/21. Offered patient to go outside the health system if needed and she would like to stay with [facility].   5.  Interventions. Will defer until after imaging.   6.  Follow-up.  Patient to follow-up after imaging.     Todays visit took place via face-to-face encounter utilizing Zoom application. Total time 18 minutes.  Estimated counseling time 15 minutes.  Counseled Ms. Tennell regarding medication and imaging.

## 2021-04-24 ENCOUNTER — Encounter: Admit: 2021-04-24 | Discharge: 2021-04-24 | Payer: MEDICARE

## 2021-04-26 ENCOUNTER — Encounter: Admit: 2021-04-26 | Discharge: 2021-04-26 | Payer: MEDICARE

## 2021-04-26 ENCOUNTER — Ambulatory Visit: Admit: 2021-04-26 | Discharge: 2021-04-26 | Payer: MEDICARE

## 2021-04-26 DIAGNOSIS — Z981 Arthrodesis status: Secondary | ICD-10-CM

## 2021-04-26 DIAGNOSIS — M4316 Spondylolisthesis, lumbar region: Secondary | ICD-10-CM

## 2021-04-26 DIAGNOSIS — G959 Disease of spinal cord, unspecified: Secondary | ICD-10-CM

## 2021-04-26 DIAGNOSIS — M5416 Radiculopathy, lumbar region: Secondary | ICD-10-CM

## 2021-04-28 ENCOUNTER — Encounter: Admit: 2021-04-28 | Discharge: 2021-04-28 | Payer: MEDICARE

## 2021-04-28 MED ORDER — PREGABALIN 200 MG PO CAP
200 mg | ORAL_CAPSULE | Freq: Three times a day (TID) | ORAL | 1 refills | Status: AC
Start: 2021-04-28 — End: ?

## 2021-04-28 NOTE — Telephone Encounter
Patient calling to request refill of lyrica to [facility], LOV7/25/22, UOV 05/03/21. Routing to Dr Noralyn Pick for approval.

## 2021-05-01 ENCOUNTER — Encounter: Admit: 2021-05-01 | Discharge: 2021-05-01 | Payer: MEDICARE

## 2021-05-01 NOTE — Telephone Encounter
Patient calling to discuss results of MRI, called pt back, let her know she will be able to go over imaging and results at her appt scheduled for Wednesday 8/10 @1120  with Dr . Pt verbalizes understanding.

## 2021-05-03 ENCOUNTER — Encounter: Admit: 2021-05-03 | Discharge: 2021-05-03 | Payer: MEDICARE

## 2021-05-03 ENCOUNTER — Ambulatory Visit: Admit: 2021-05-03 | Discharge: 2021-05-03 | Payer: MEDICARE

## 2021-05-03 DIAGNOSIS — G959 Disease of spinal cord, unspecified: Secondary | ICD-10-CM

## 2021-05-03 DIAGNOSIS — M5020 Other cervical disc displacement, unspecified cervical region: Secondary | ICD-10-CM

## 2021-05-03 DIAGNOSIS — Z8614 Personal history of Methicillin resistant Staphylococcus aureus infection: Secondary | ICD-10-CM

## 2021-05-03 DIAGNOSIS — F419 Anxiety disorder, unspecified: Secondary | ICD-10-CM

## 2021-05-03 DIAGNOSIS — M25552 Pain in left hip: Secondary | ICD-10-CM

## 2021-05-03 DIAGNOSIS — K219 Gastro-esophageal reflux disease without esophagitis: Secondary | ICD-10-CM

## 2021-05-03 DIAGNOSIS — J189 Pneumonia, unspecified organism: Secondary | ICD-10-CM

## 2021-05-03 DIAGNOSIS — M25551 Pain in right hip: Secondary | ICD-10-CM

## 2021-05-03 DIAGNOSIS — M4802 Spinal stenosis, cervical region: Secondary | ICD-10-CM

## 2021-05-03 DIAGNOSIS — Z8679 Personal history of other diseases of the circulatory system: Secondary | ICD-10-CM

## 2021-05-03 DIAGNOSIS — I10 Essential (primary) hypertension: Secondary | ICD-10-CM

## 2021-05-03 DIAGNOSIS — M797 Fibromyalgia: Secondary | ICD-10-CM

## 2021-05-03 DIAGNOSIS — E785 Hyperlipidemia, unspecified: Secondary | ICD-10-CM

## 2021-05-03 DIAGNOSIS — K5900 Constipation, unspecified: Secondary | ICD-10-CM

## 2021-05-03 DIAGNOSIS — G5600 Carpal tunnel syndrome, unspecified upper limb: Secondary | ICD-10-CM

## 2021-05-03 DIAGNOSIS — Z86010 Personal history of colonic polyps: Secondary | ICD-10-CM

## 2021-05-03 MED ORDER — ACETAMINOPHEN-CODEINE 300-60 MG PO TAB
1 | ORAL_TABLET | ORAL | 0 refills | 3.00000 days | Status: AC | PRN
Start: 2021-05-03 — End: ?

## 2021-05-03 MED ORDER — LIDOCAINE-PRILOCAINE 2.5-2.5 % TP CREA
TOPICAL | 11 refills | Status: AC | PRN
Start: 2021-05-03 — End: ?

## 2021-05-04 NOTE — Patient Instructions
It was a pleasure seeing you in clinic today.    Zaron Zwiefelhofer RN, CNC  Dr. Joshua T. Bunch/Ortho Spine Surgery  [facility]  [facility]  [address]  [place], [place] [address]  Phone: [phone]   Fax [phone]  Scheduling [phone]  [email or web address]    We appreciate the interest in your health.  The physician reviews both the radiology reports and the imaging independently and will contact you if there are any emergent findings.  If you would like, incidental and chronic changes seen in your images can be reviewed with Dr. Bunch at a follow up appointment.  At that appointment, the radiology report and terminology used in that report can also be explained. Please call our Schedule line if you would like to follow up.

## 2021-05-05 ENCOUNTER — Encounter: Admit: 2021-05-05 | Discharge: 2021-05-05 | Payer: MEDICARE

## 2021-05-05 MED ORDER — KETOCONAZOLE 2 % TP CREA
Freq: Two times a day (BID) | 11 refills
Start: 2021-05-05 — End: ?

## 2021-05-11 ENCOUNTER — Encounter: Admit: 2021-05-11 | Discharge: 2021-05-11 | Payer: MEDICARE

## 2021-05-11 MED ORDER — TRIAMCINOLONE ACETONIDE 0.1 % TP OINT
Freq: Two times a day (BID) | 11 refills
Start: 2021-05-11 — End: ?

## 2021-05-11 NOTE — Telephone Encounter
Patient called left message that she was running low on EMLA using it on low back/buttocks/thigh area, called pharmacy and supply changed to 5 days, refills available, called pt left message of this for patient.

## 2021-05-17 ENCOUNTER — Ambulatory Visit: Admit: 2021-05-17 | Discharge: 2021-05-17 | Payer: MEDICARE

## 2021-05-17 ENCOUNTER — Encounter: Admit: 2021-05-17 | Discharge: 2021-05-17 | Payer: MEDICARE

## 2021-05-17 DIAGNOSIS — F419 Anxiety disorder, unspecified: Secondary | ICD-10-CM

## 2021-05-17 DIAGNOSIS — M4802 Spinal stenosis, cervical region: Secondary | ICD-10-CM

## 2021-05-17 DIAGNOSIS — K5900 Constipation, unspecified: Secondary | ICD-10-CM

## 2021-05-17 DIAGNOSIS — G959 Disease of spinal cord, unspecified: Secondary | ICD-10-CM

## 2021-05-17 DIAGNOSIS — M5020 Other cervical disc displacement, unspecified cervical region: Secondary | ICD-10-CM

## 2021-05-17 DIAGNOSIS — M4316 Spondylolisthesis, lumbar region: Secondary | ICD-10-CM

## 2021-05-17 DIAGNOSIS — Z8679 Personal history of other diseases of the circulatory system: Secondary | ICD-10-CM

## 2021-05-17 DIAGNOSIS — J189 Pneumonia, unspecified organism: Secondary | ICD-10-CM

## 2021-05-17 DIAGNOSIS — E785 Hyperlipidemia, unspecified: Secondary | ICD-10-CM

## 2021-05-17 DIAGNOSIS — Z981 Arthrodesis status: Secondary | ICD-10-CM

## 2021-05-17 DIAGNOSIS — K219 Gastro-esophageal reflux disease without esophagitis: Secondary | ICD-10-CM

## 2021-05-17 DIAGNOSIS — Z86010 Personal history of colonic polyps: Secondary | ICD-10-CM

## 2021-05-17 DIAGNOSIS — G5600 Carpal tunnel syndrome, unspecified upper limb: Secondary | ICD-10-CM

## 2021-05-17 DIAGNOSIS — M5416 Radiculopathy, lumbar region: Secondary | ICD-10-CM

## 2021-05-17 DIAGNOSIS — I10 Essential (primary) hypertension: Secondary | ICD-10-CM

## 2021-05-17 DIAGNOSIS — M797 Fibromyalgia: Secondary | ICD-10-CM

## 2021-05-17 DIAGNOSIS — Z8614 Personal history of Methicillin resistant Staphylococcus aureus infection: Secondary | ICD-10-CM

## 2021-05-17 DIAGNOSIS — G6289 Other specified polyneuropathies: Secondary | ICD-10-CM

## 2021-05-17 DIAGNOSIS — G629 Polyneuropathy, unspecified: Secondary | ICD-10-CM

## 2021-05-17 NOTE — Procedures
Electrodiagnostic Laboratory Report    Impression:  ABNORMAL    1. 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  2. There is no evidence of a left L3-S1 radiculopathy.  3. There is no evidence of a bilateral sciatic, tibial or fibular mononeuropathy as the sensory nerve conduction studies were normal.     Clinical Correlation:  Follow-up with referring physician.    Thank you for allowing me to perform electrodiagnostic testing on your patients.  A full EMG/NCS report will be scanned into O2/EPIC, including waveforms.  If you have any further questions or comments, please do not hesitate to call.    Harland German, MD  Diplomate, American Board of Physical Medicine and Rehabilitation  Diplomate, American Association of Neuromuscular and Electrodiagnostic Medicine

## 2021-05-24 ENCOUNTER — Ambulatory Visit: Admit: 2021-05-24 | Discharge: 2021-05-24 | Payer: MEDICARE

## 2021-05-24 ENCOUNTER — Encounter: Admit: 2021-05-24 | Discharge: 2021-05-24 | Payer: MEDICARE

## 2021-05-24 DIAGNOSIS — M25552 Pain in left hip: Secondary | ICD-10-CM

## 2021-05-24 DIAGNOSIS — M25551 Pain in right hip: Secondary | ICD-10-CM

## 2021-05-24 DIAGNOSIS — S76312A Strain of muscle, fascia and tendon of the posterior muscle group at thigh level, left thigh, initial encounter: Secondary | ICD-10-CM

## 2021-05-24 NOTE — Telephone Encounter
Patient called left message was unable to hear message or tell what pt was asking or requesting called pt back at this time but no answer so left message and can discuss at appt in the morning tomorrow.

## 2021-05-25 ENCOUNTER — Encounter: Admit: 2021-05-25 | Discharge: 2021-05-25 | Payer: MEDICARE

## 2021-05-25 ENCOUNTER — Ambulatory Visit: Admit: 2021-05-25 | Discharge: 2021-05-26 | Payer: MEDICARE

## 2021-05-25 DIAGNOSIS — I10 Essential (primary) hypertension: Secondary | ICD-10-CM

## 2021-05-25 DIAGNOSIS — K219 Gastro-esophageal reflux disease without esophagitis: Secondary | ICD-10-CM

## 2021-05-25 DIAGNOSIS — Z8679 Personal history of other diseases of the circulatory system: Secondary | ICD-10-CM

## 2021-05-25 DIAGNOSIS — K5900 Constipation, unspecified: Secondary | ICD-10-CM

## 2021-05-25 DIAGNOSIS — M25552 Pain in left hip: Secondary | ICD-10-CM

## 2021-05-25 DIAGNOSIS — Z86010 Personal history of colonic polyps: Secondary | ICD-10-CM

## 2021-05-25 DIAGNOSIS — M4802 Spinal stenosis, cervical region: Secondary | ICD-10-CM

## 2021-05-25 DIAGNOSIS — M797 Fibromyalgia: Secondary | ICD-10-CM

## 2021-05-25 DIAGNOSIS — Z8614 Personal history of Methicillin resistant Staphylococcus aureus infection: Secondary | ICD-10-CM

## 2021-05-25 DIAGNOSIS — S76312D Strain of muscle, fascia and tendon of the posterior muscle group at thigh level, left thigh, subsequent encounter: Secondary | ICD-10-CM

## 2021-05-25 DIAGNOSIS — M5020 Other cervical disc displacement, unspecified cervical region: Secondary | ICD-10-CM

## 2021-05-25 DIAGNOSIS — E785 Hyperlipidemia, unspecified: Secondary | ICD-10-CM

## 2021-05-25 DIAGNOSIS — F419 Anxiety disorder, unspecified: Secondary | ICD-10-CM

## 2021-05-25 DIAGNOSIS — G5600 Carpal tunnel syndrome, unspecified upper limb: Secondary | ICD-10-CM

## 2021-05-25 DIAGNOSIS — J189 Pneumonia, unspecified organism: Secondary | ICD-10-CM

## 2021-05-25 DIAGNOSIS — G959 Disease of spinal cord, unspecified: Secondary | ICD-10-CM

## 2021-05-25 MED ORDER — KETOCONAZOLE 2 % TP CREA
Freq: Two times a day (BID) | 11 refills
Start: 2021-05-25 — End: ?

## 2021-05-25 NOTE — Progress Notes
Obtained patient's verbal consent to treat them and their agreement to [facility] financial policy and NPP via this telehealth visit during the [facility]    SPINE CENTER CLINIC NOTE       SUBJECTIVE: Ms. Illa is a 60 year old female with history of high-frequency spinal cord stimulator trial, presents with worsening gluteal pain.  She was last seen on 05/03/2021, at which point we recommended MRI of the pelvis.  MRI has been completed and detailed below.  He continues to have pain at the left ischial region.  She is now reporting some symptoms on the right.  VAS pain score is rated as a 6/10.         Review of Systems    Current Outpatient Medications:   ?  acetaminophen (TYLENOL) 500 mg tablet, Take two tablets by mouth every 6 hours as needed for Pain. Max of 4,000 mg of acetaminophen in 24 hours., Disp: 500 tablet, Rfl: 1  ?  acetaminophen-codeine (TYLENOL-CODEINE #4) 300-60 mg tablet, Take one tablet by mouth every 4 hours as needed for Pain., Disp: 60 tablet, Rfl: 0  ?  albuterol sulfate (PROAIR HFA) 90 mcg/actuation HFA aerosol inhaler, Inhale 2 Puffs by mouth into the lungs every 6 hours as needed for Wheezing or Shortness of Breath. Shake well before use., Disp: , Rfl:   ?  AMITR/GABAPEN/EMU OIL 12/26/08% CREAM (COMPOUND), Apply 0.5grams to affected area three times daily PRN for pain, Disp: 50 g, Rfl: 3  ?  aspirin EC 81 mg tablet, Take 81 mg by mouth daily. Take with food., Disp: , Rfl:   ?  baclofen (LIORESAL) 10 mg tablet, Take 10 mg by mouth at bedtime daily., Disp: , Rfl:   ?  busPIRone (BUSPAR) 5 mg tablet, Take 10 mg by mouth twice daily., Disp: , Rfl:   ?  calcium carbonate (CALCIUM 600 PO), Take 1 tablet by mouth daily., Disp: , Rfl:   ?  celecoxib (CELEBREX) 200 mg capsule, Take 200 mg by mouth daily., Disp: , Rfl:   ?  cetirizine (ZYRTEC) 10 mg tablet, Take 10 mg by mouth daily as needed for Allergy symptoms., Disp: , Rfl:   ?  Cholecalciferol (Vitamin D3) (VITAMIN D-3) 2,000 unit cap, Take two capsules by mouth daily. (Patient taking differently: Take 1 capsule by mouth daily.), Disp: 300 capsule, Rfl: 11  ?  clonazePAM (KLONOPIN) 0.5 mg tablet, Take 0.5 mg by mouth daily as needed., Disp: , Rfl:   ?  denosumab (PROLIA) 60 mg/mL injection, Inject 60 mg under the skin every 180 days., Disp: , Rfl:   ?  diclofenac (VOLTAREN) 1 % topical gel, Apply 4 g topically to affected area four times daily., Disp: , Rfl:   ?  duloxetine DR (CYMBALTA) 60 mg capsule, Take 60 mg by mouth twice daily., Disp: , Rfl:   ?  fish oil- omega 3-DHA/EPA 300/1,000 mg capsule, Take 1 capsule by mouth daily., Disp: , Rfl:   ?  HYDROcodone/acetaminophen (NORCO) 5/325 mg tablet, Take 1 tablet by mouth every 6 hours as needed for Pain., Disp: , Rfl:   ?  ketoconazole (NIZORAL) 2 % topical cream, Apply  topically to affected area twice daily., Disp: 60 g, Rfl: 11  ?  lidocaine (LIDODERM) 5 % topical patch, Apply one patch topically to affected area daily. Apply patch for 12 hours, then remove for 12 hours before repeating., Disp: 90 patch, Rfl: 3  ?  lidocaine/prilocaine (EMLA) 2.5/2.5 % topical cream, Apply  topically  to affected area as Needed., Disp: 30 g, Rfl: 11  ?  lisinopril (PRINIVIL; ZESTRIL) 10 mg tablet, Take 10 mg by mouth daily., Disp: , Rfl:   ?  mirtazapine (REMERON) 15 mg tablet, Take 15 mg by mouth at bedtime daily., Disp: , Rfl:   ?  mupirocin (BACTROBAN) 2 % topical ointment, Apply  topically to affected area twice daily., Disp: 22 g, Rfl: 11  ?  omeprazole DR(+) (PRILOSEC) 40 mg capsule, Take 40 mg by mouth daily before breakfast., Disp: , Rfl:   ?  pregabalin (LYRICA) 200 mg capsule, Take one capsule by mouth three times daily., Disp: 90 capsule, Rfl: 1  ?  REXULTI 2 mg tablet, , Disp: , Rfl:   ?  rosuvastatin (CRESTOR) 10 mg tablet, TAKE ONE TABLET BY MOUTH DAILY. INDICATIONS: EXCESSIVE FAT IN THE BLOOD, Disp: 90 tablet, Rfl: 3  ?  tiZANidine (ZANAFLEX) 4 mg tablet, Take 4 mg by mouth every 6 hours as needed., Disp: , Rfl:   ?  triamcinolone acetonide (KENALOG) 0.1 % topical ointment, Apply  topically to affected area twice daily. Mix triamcinolone ointment in a 1:1 ratio with mupirocin. Apply to lesions daily and cover for 2-3 weeks until resolved, Disp: 15 g, Rfl: 11  ?  walker (ULTRA-LIGHT ROLLATOR) medical supply, Use as directed., Disp: 1 each, Rfl: 0    Current Facility-Administered Medications:   ?  diazePAM (VALIUM) tablet 5 mg, 5 mg, Oral, PRN, [name], MD, 5 mg at 04/27/19 1429  Allergies   Allergen Reactions   ? Ascorbic Acid UNKNOWN   ? Erythromycin RASH     Rash on trunk   ? Strawberry UNKNOWN     Physical Exam  Vitals:    05/25/21 0901   PainSc: Six   Weight: 97.5 kg (215 lb)   Height: 170.2 cm (5' 7)     Oswestry Total Score:: 76  Pain Score: Six  Body mass index is 33.67 kg/m?Marland Kitchen  General: 60 y.o. female appears stated age, in no acute distress  HEENT: Normocephalic, atraumatic  Neck: No thyroidmegaly  Cardiovascular: Well perfused  Pulmonary: Unlabored respirations  Extremities: No cyanosis, clubbing, or edema  Skin: No lesions seen on exposed skin  Psychiatric:  Appropriate mood and affect  Musculoskeletal: No atrophy.   Neurologic: Antigravity strength in all extremities. CN II -XII grossly intact.  Alert and oriented x 3.     Diagnostics:  MRI pelvis from 05/24/2021 was personally reviewed demonstrated full-thickness chronic appearing avulsion of the abductor magnus from the ischial tuberosity with tendon retraction.  There is also partial thickness tear of the semimembranosus and conjoint tendon bilaterally.         IMPRESSION:  1. Tear of left hamstring, subsequent encounter    2. Ischial pain, left          PLAN:    1.  Lifestyle modification.  Recommend activity as tolerated.  2.  Medication.  Continue medications as previously prescribed.  3.  Therapy.  I would defer physical therapy at this time.  4.  Interventions.  Not indicated this time.  5.  Referral.  Refer patient orthopedic surgery for surgical evaluation.  If she is not a surgical candidate, I would recommend relative rest.  6.  Follow-up.  Patient is a follow-up after surgical evaluation.          Total time 15 minutes.  Estimated counseling time 10 minutes.  Counseled Ms. Celli regarding the issues above.

## 2021-05-25 NOTE — Telephone Encounter
Patient called wanted to see if Dr Katrinka Blazing would consider doing injection for low back pain she is having, reviewed notes and no mention of intervention, pt would like to see what his recommendations would be. Routing to Dr Katrinka Blazing for advice.

## 2021-05-26 ENCOUNTER — Encounter: Admit: 2021-05-26 | Discharge: 2021-05-26 | Payer: MEDICARE

## 2021-05-30 ENCOUNTER — Encounter: Admit: 2021-05-30 | Discharge: 2021-05-30 | Payer: MEDICARE

## 2021-05-30 DIAGNOSIS — M5416 Radiculopathy, lumbar region: Secondary | ICD-10-CM

## 2021-06-01 ENCOUNTER — Encounter: Admit: 2021-06-01 | Discharge: 2021-06-01 | Payer: MEDICARE

## 2021-06-01 DIAGNOSIS — R0602 Shortness of breath: Secondary | ICD-10-CM

## 2021-06-02 ENCOUNTER — Encounter: Admit: 2021-06-02 | Discharge: 2021-06-02 | Payer: MEDICARE

## 2021-06-02 MED ORDER — PREGABALIN 200 MG PO CAP
200 mg | ORAL_CAPSULE | Freq: Three times a day (TID) | ORAL | 1 refills | Status: AC
Start: 2021-06-02 — End: ?

## 2021-06-02 NOTE — Telephone Encounter
Patient called to request refill for lyrica, last fill 8/5, LOV 9/1, routing to Dr Katrinka Blazing for consideration.

## 2021-06-07 ENCOUNTER — Encounter: Admit: 2021-06-07 | Discharge: 2021-06-07 | Payer: MEDICARE

## 2021-06-07 MED ORDER — ROSUVASTATIN 10 MG PO TAB
ORAL_TABLET | Freq: Every day | ORAL | 3 refills | 90.00000 days | Status: AC
Start: 2021-06-07 — End: ?

## 2021-06-07 MED ORDER — TRIAMCINOLONE ACETONIDE 0.1 % TP OINT
Freq: Two times a day (BID) | 11 refills
Start: 2021-06-07 — End: ?

## 2021-06-19 ENCOUNTER — Encounter: Admit: 2021-06-19 | Discharge: 2021-06-19 | Payer: MEDICARE

## 2021-06-19 MED ORDER — TRIAMCINOLONE ACETONIDE 0.1 % TP OINT
Freq: Two times a day (BID) | 11 refills
Start: 2021-06-19 — End: ?

## 2021-06-26 ENCOUNTER — Encounter: Admit: 2021-06-26 | Discharge: 2021-06-26 | Payer: MEDICARE

## 2021-06-27 ENCOUNTER — Encounter: Admit: 2021-06-27 | Discharge: 2021-06-27 | Payer: MEDICARE

## 2021-06-27 ENCOUNTER — Ambulatory Visit: Admit: 2021-06-27 | Discharge: 2021-06-27 | Payer: MEDICARE

## 2021-06-27 DIAGNOSIS — M5416 Radiculopathy, lumbar region: Secondary | ICD-10-CM

## 2021-06-27 NOTE — Patient Instructions
Not procedure today. Pt not feeling well. Will reschedule.

## 2021-06-27 NOTE — Procedures
No procedure.  Will reschedule.

## 2021-06-27 NOTE — Progress Notes
Pt not feeling well.. No procedure today.

## 2021-06-29 ENCOUNTER — Encounter: Admit: 2021-06-29 | Discharge: 2021-06-29 | Payer: MEDICARE

## 2021-07-05 ENCOUNTER — Encounter: Admit: 2021-07-05 | Discharge: 2021-07-05 | Payer: MEDICARE

## 2021-07-05 ENCOUNTER — Ambulatory Visit: Admit: 2021-07-05 | Discharge: 2021-07-05 | Payer: MEDICARE

## 2021-07-05 DIAGNOSIS — G5600 Carpal tunnel syndrome, unspecified upper limb: Secondary | ICD-10-CM

## 2021-07-05 DIAGNOSIS — Z8679 Personal history of other diseases of the circulatory system: Secondary | ICD-10-CM

## 2021-07-05 DIAGNOSIS — K5900 Constipation, unspecified: Secondary | ICD-10-CM

## 2021-07-05 DIAGNOSIS — J189 Pneumonia, unspecified organism: Secondary | ICD-10-CM

## 2021-07-05 DIAGNOSIS — K219 Gastro-esophageal reflux disease without esophagitis: Secondary | ICD-10-CM

## 2021-07-05 DIAGNOSIS — Z8614 Personal history of Methicillin resistant Staphylococcus aureus infection: Secondary | ICD-10-CM

## 2021-07-05 DIAGNOSIS — F419 Anxiety disorder, unspecified: Secondary | ICD-10-CM

## 2021-07-05 DIAGNOSIS — G959 Disease of spinal cord, unspecified: Secondary | ICD-10-CM

## 2021-07-05 DIAGNOSIS — M5020 Other cervical disc displacement, unspecified cervical region: Secondary | ICD-10-CM

## 2021-07-05 DIAGNOSIS — Z86010 Personal history of colonic polyps: Secondary | ICD-10-CM

## 2021-07-05 DIAGNOSIS — M4802 Spinal stenosis, cervical region: Secondary | ICD-10-CM

## 2021-07-05 DIAGNOSIS — E785 Hyperlipidemia, unspecified: Secondary | ICD-10-CM

## 2021-07-05 DIAGNOSIS — I10 Essential (primary) hypertension: Secondary | ICD-10-CM

## 2021-07-05 DIAGNOSIS — S76312A Strain of muscle, fascia and tendon of the posterior muscle group at thigh level, left thigh, initial encounter: Principal | ICD-10-CM

## 2021-07-05 DIAGNOSIS — M797 Fibromyalgia: Secondary | ICD-10-CM

## 2021-07-05 NOTE — Patient Instructions
It was a pleasure seeing you today. Please contact us if you have any further questions, we are happy to assist.     Dr. Matthew Vopat - Orthopedic Surgeon, Sports Medicine  [facility] - Phone [phone] - Fax [phone]   [address], [address] - [place],  [address]    To schedule an appointment, please call our scheduling line at [phone]

## 2021-07-06 ENCOUNTER — Encounter: Admit: 2021-07-06 | Discharge: 2021-07-06 | Payer: MEDICARE

## 2021-07-06 ENCOUNTER — Ambulatory Visit: Admit: 2021-07-06 | Discharge: 2021-07-06 | Payer: MEDICARE

## 2021-07-06 DIAGNOSIS — R079 Chest pain, unspecified: Secondary | ICD-10-CM

## 2021-07-10 ENCOUNTER — Encounter: Admit: 2021-07-10 | Discharge: 2021-07-10 | Payer: MEDICARE

## 2021-07-10 DIAGNOSIS — S76312A Strain of muscle, fascia and tendon of the posterior muscle group at thigh level, left thigh, initial encounter: Secondary | ICD-10-CM

## 2021-07-18 ENCOUNTER — Encounter: Admit: 2021-07-18 | Discharge: 2021-07-18 | Payer: MEDICARE

## 2021-07-18 ENCOUNTER — Ambulatory Visit: Admit: 2021-07-18 | Discharge: 2021-07-18 | Payer: MEDICARE

## 2021-07-18 DIAGNOSIS — Z86010 Personal history of colonic polyps: Secondary | ICD-10-CM

## 2021-07-18 DIAGNOSIS — Z8614 Personal history of Methicillin resistant Staphylococcus aureus infection: Secondary | ICD-10-CM

## 2021-07-18 DIAGNOSIS — G5600 Carpal tunnel syndrome, unspecified upper limb: Secondary | ICD-10-CM

## 2021-07-18 DIAGNOSIS — M5020 Other cervical disc displacement, unspecified cervical region: Secondary | ICD-10-CM

## 2021-07-18 DIAGNOSIS — F419 Anxiety disorder, unspecified: Secondary | ICD-10-CM

## 2021-07-18 DIAGNOSIS — S76312A Strain of muscle, fascia and tendon of the posterior muscle group at thigh level, left thigh, initial encounter: Secondary | ICD-10-CM

## 2021-07-18 DIAGNOSIS — Z8679 Personal history of other diseases of the circulatory system: Secondary | ICD-10-CM

## 2021-07-18 DIAGNOSIS — M25552 Pain in left hip: Secondary | ICD-10-CM

## 2021-07-18 DIAGNOSIS — M797 Fibromyalgia: Secondary | ICD-10-CM

## 2021-07-18 DIAGNOSIS — M5137 Other intervertebral disc degeneration, lumbosacral region: Secondary | ICD-10-CM

## 2021-07-18 DIAGNOSIS — M4802 Spinal stenosis, cervical region: Secondary | ICD-10-CM

## 2021-07-18 DIAGNOSIS — K5900 Constipation, unspecified: Secondary | ICD-10-CM

## 2021-07-18 DIAGNOSIS — K219 Gastro-esophageal reflux disease without esophagitis: Secondary | ICD-10-CM

## 2021-07-18 DIAGNOSIS — G959 Disease of spinal cord, unspecified: Secondary | ICD-10-CM

## 2021-07-18 DIAGNOSIS — J189 Pneumonia, unspecified organism: Secondary | ICD-10-CM

## 2021-07-18 DIAGNOSIS — E785 Hyperlipidemia, unspecified: Secondary | ICD-10-CM

## 2021-07-18 DIAGNOSIS — I10 Essential (primary) hypertension: Secondary | ICD-10-CM

## 2021-07-18 DIAGNOSIS — M5416 Radiculopathy, lumbar region: Secondary | ICD-10-CM

## 2021-07-18 MED ORDER — ROPIVACAINE (PF) 5 MG/ML (0.5 %) IJ SOLN
5 mL | Freq: Once | INTRAMUSCULAR | 0 refills | Status: CP | PRN
Start: 2021-07-18 — End: ?
  Administered 2021-07-18: 16:00:00 5 mL via INTRAMUSCULAR

## 2021-07-18 NOTE — Patient Instructions
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  Activity modification: Continue using wheeled walker for assisted ambulation and reduction in fall risk, avoid prolonged sitting.  2.  Medication: No new medications prescribed and okay for refills by Dr. Curly Rim for your Emla cream (office contacted by Aundra Millet, ATC today)  3.  Therapy: Follow through with first physical therapy visit as from your description this will include aqua therapy and more focused therapeutic exercise for lumbosacral back and hamstring back in [place], [place].  4.  Intervention: Ice frequently 10 X / day for 10 minutes better than 1 X for 100 min.  Diagnostic lidocaine/ropivacaine test today to determine how much pain is coming from proximal hamstring versus other pain generators such as lumbosacral back, greater trochanteric syndrome, and sciatica.  5.  Follow up: By MyChart message tomorrow updating me on pain pattern based on today's injection.  If significant reduction in pain returns with ropivacaine wears off then will consider PRP (platelet rich plasma) injection, however as advised these injections are painful and did not provide immediate relief and per best research studies can take up to 6 months for full efficacy.  Agree with Dr. Susy Frizzle Vopat that surgical intervention has relative contraindications because of your chronic lumbosacral back pain and tobaccoism.       If you are signed up for MyChart (a secure internal messaging system) then you can message me with questions or concerns.  You can also call my Clinical Athletic Trainer Aundra Millet, ATC) at [phone] if you prefer leaving a message for a call back.   If you need assistance signing up for MyChart please contact us.    For follow-up appointments please call the appointment line [phone].    Teena Irani. Katrinka Blazing, M.D.  Associate Professor  Wellsite geologist, Youth Sports Medicine  [employer], Sports Medicine and [employer]

## 2021-07-18 NOTE — Progress Notes
Date of Service: 07/18/2021     Subjective:   DOI: 12/03/2020          History of Present Illness    Kristen Vaughn is a 60 y.o. female here accompanied by her caregiver and friend Kristen Vaughn) referred by Dr. Susy Frizzle Vaughn for possible PRP injection for chronic left proximal hamstring tear.  She dates her injury back to 3/22 when she slipped and fell hyperextending her left knee and having sharp pain in her left hamstring.  Since then she has had chronic persistent disabling pain mostly in her left buttocks but it does radiate down the posterior thigh and proximally into her lumbosacral region.  She describes a crampy, achy, sometimes burning pain and a sensation of weakness in her left lower extremity.  She has been under the care of Kristen Vaughn with remote history of C6-7 ACDF in 2020 and is also seen Kristen Vaughn early summer for cervical and lumbosacral pain.  With increasing pain in her left buttocks an MRI was done 05/24/2021 showing chronic full-thickness proximal hamstring tear involving adductor magnus and high-grade partial thickness tears of bilateral semimembranosus and conjoined tendon.  Kristen Vaughn referred her to Dr. Susy Frizzle Vaughn who evaluated her and decided that due to the chronicity of this tear as well as her chronic lumbosacral back degenerative disc disease and her tobaccoism that she would be a poor candidate for surgery and advised physical therapy and consideration of PRP injection.  Kristen Vaughn was prepared to do an epidural corticosteroid by patient report but because of the potential for PRP injection he chose to hold on that pending Kristen Vaughn consultation.  Patient is here tearfully explaining she has been in chronic increasing pain and is hopeful of the PRP injection will reduce this pain and allow her increased function.  She has not started physical therapy yet.  She has tried topical analgesics including compounded creams, lidocaine patches, and is found the Emla cream most beneficial in reducing her pain requesting a refill on that.  (Note: I will help facilitate this by contacting Dr. Alanson Aly Morrigan Vaughn's office for further refills).  Patient has not had any prior surgeries about her hip or pelvis nor any injections into her hip joint, greater trochanteric region, or ischial tuberosity.  MRI did not show ischial tuberosity bursitis.  Patient does use a wheeled walker but states she can stand without the walker just cannot fully bear weight nor do a single-leg stance on her affected left lower extremity.  PMH is a complicated medical history as listed below.  Medications: Reviewed and noted in O2  Allergies: Erythromycin causing rash, ascorbic acid reaction unknown  Social history: Single, lives in [place], has a caretaker who is present with her today.  40-pack-year smoking history no intention to quit.  No regular exercise.    Medical History:   Diagnosis Date   ? Anxiety and depression    ? Carpal tunnel syndrome    ? Cervical myelopathy (HCC)    ? Cervical stenosis of spine    ? Chest pain    ? Constipation    ? Essential hypertension    ? Fibromyalgia    ? GERD (gastroesophageal reflux disease)    ? Herniated disc, cervical    ? History of abnormal electrocardiogram 01/2016   ? History of colon polyps 2015   ? History of MRSA infection 2019    right hand/[facility]   ? Hyperlipemia 08/09/2009   ? Pneumonia  Surgical History:   Procedure Laterality Date   ? HX HYSTERECTOMY  1985   ? UPPER GASTROINTESTINAL ENDOSCOPY  2015   ? COLONOSCOPY  2015    hx colon polyps x2   ? CARDIOVASCULAR STRESS TEST  01/2016   ? FINGER TRIGGER RELEASE Right 2019   ? CARPAL TUNNEL RELEASE Right 2019   ? ANTERIOR CERVICAL DECOMPRESSION AND FUSION AT CERVICAL 6-7 N/A 10/28/2018    Performed by Kristen Rosales, MD at [facility] OR   ? ARTHRODESIS SPINE ANTERIOR INTERBODY WITH DISCECTOMY/ OSTEOPHYTECTOMY/ DECOMPRESSION - CERVICAL BELOW C2 - EACH ADDITIONAL INTERSPACE N/A 10/28/2018    Performed by Kristen Rosales, MD at [facility] OR   ? ANTERIOR INSTRUMENTATION - 2 TO 3 VERTEBRAL SEGMENTS N/A 10/28/2018    Performed by Kristen Rosales, MD at [facility] OR   ? DILATION AND CURETTAGE  1980's     Social History     Socioeconomic History   ? Marital status: Single   Tobacco Use   ? Smoking status: Current Some Day Smoker     Packs/day: 1.00     Years: 40.00     Pack years: 40.00     Types: Cigarettes   ? Smokeless tobacco: Never Used   ? Tobacco comment: variable 1-1/2 a day   Substance and Sexual Activity   ? Alcohol use: Yes     Alcohol/week: 0.0 standard drinks     Comment: social   ? Drug use: Yes     Types: Marijuana     Comment: rare for back pain            Review of Systems  13 point review of systems otherwise noncontributory except for that noted in HPI above.  Specifically patient denies any bowel or bladder dysfunction.    Objective:         ? acetaminophen (TYLENOL) 500 mg tablet Take two tablets by mouth every 6 hours as needed for Pain. Max of 4,000 mg of acetaminophen in 24 hours.   ? albuterol sulfate (PROAIR HFA) 90 mcg/actuation HFA aerosol inhaler Inhale 2 Puffs by mouth into the lungs every 6 hours as needed for Wheezing or Shortness of Breath. Shake well before use.   ? AMITR/GABAPEN/EMU OIL 12/26/08% CREAM (COMPOUND) Apply 0.5grams to affected area three times daily PRN for pain   ? aspirin EC 81 mg tablet Take 81 mg by mouth daily. Take with food.   ? baclofen (LIORESAL) 10 mg tablet Take 10 mg by mouth at bedtime daily.   ? busPIRone (BUSPAR) 5 mg tablet Take 10 mg by mouth twice daily.   ? calcium carbonate (CALCIUM 600 PO) Take 1 tablet by mouth daily.   ? celecoxib (CELEBREX) 200 mg capsule Take 200 mg by mouth daily.   ? cetirizine (ZYRTEC) 10 mg tablet Take 10 mg by mouth daily as needed for Allergy symptoms.   ? Cholecalciferol (Vitamin D3) (VITAMIN D-3) 2,000 unit cap Take two capsules by mouth daily. (Patient taking differently: Take 1 capsule by mouth daily.)   ? clonazePAM (KLONOPIN) 0.5 mg tablet Take 0.5 mg by mouth daily as needed.   ? denosumab (PROLIA) 60 mg/mL injection Inject 60 mg under the skin every 180 days.   ? desvenlafaxine succinate (PRISTIQ) 50 mg tablet Take 50 mg by mouth.   ? diclofenac (VOLTAREN) 1 % topical gel Apply 4 g topically to affected area four times daily.   ? fish oil- omega 3-DHA/EPA 300/1,000 mg capsule Take 1 capsule  by mouth daily.   ? ketoconazole (NIZORAL) 2 % topical cream Apply  topically to affected area twice daily.   ? lidocaine (LIDODERM) 5 % topical patch Apply one patch topically to affected area daily. Apply patch for 12 hours, then remove for 12 hours before repeating.   ? lidocaine/prilocaine (EMLA) 2.5/2.5 % topical cream Apply  topically to affected area as Needed.   ? lisinopril (PRINIVIL; ZESTRIL) 10 mg tablet Take 10 mg by mouth daily.   ? mirtazapine (REMERON) 15 mg tablet Take 15 mg by mouth at bedtime daily.   ? mupirocin (BACTROBAN) 2 % topical ointment Apply  topically to affected area twice daily.   ? omeprazole DR(+) (PRILOSEC) 40 mg capsule Take 40 mg by mouth daily before breakfast.   ? pregabalin (LYRICA) 200 mg capsule Take one capsule by mouth three times daily.   ? REXULTI 2 mg tablet    ? rosuvastatin (CRESTOR) 10 mg tablet TAKE ONE TABLET BY MOUTH DAILY.   ? tiZANidine (ZANAFLEX) 4 mg tablet Take 4 mg by mouth every 6 hours as needed.   ? triamcinolone acetonide (KENALOG) 0.1 % topical ointment APPLY TO AFFECTED AREA TWICE A DAY   ? walker (ULTRA-LIGHT ROLLATOR) medical supply Use as directed.     There were no vitals filed for this visit.  There is no height or weight on file to calculate BMI.     Physical Exam   Reviewed nursing intake, vital signs not recorded by rooming staff  General: Obese female by previous weight with BMI 34.5, tearful and in mild to moderate distress as she sits in the side chair and requires her wheeled walker to stand to move to the examination table, articulate speech  Psych: affect and mood appropriate, cooperative during encounter  Eyes:  conjunctiva clear, no sclericterus     Chest/lungs: non-labored respirations  CV/Vascular:  NA unless noted in MSK exam  Skin:  non-diaphoretic, no significant skin rashes unless noted in MSK exam    Ortho Exam  Patient is able to stand unassisted but is noted to unload her left lower extremity and holds the exam table next to her to try to fully bear weight in a tandem stance.  Screening back exam standing shows ability to flex at the waist to about 75 degrees with decreased intervertebral segmental motion and increased lumbosacral back pain and pain in her left buttocks radiating to the mid posterior left thigh.  I did not have her try rotation or extension.  Straight leg raise test seated is negative but supine is positive on the left negative on the right and radiates pain to the posterior thigh and the popliteal fossa sparing the calf foot or ankle.  DTRs deferred today.  Left hip/thigh-logrolling is negative, manual muscle testing in supine position shows no strength deficit on hip flexion, abduction, but does elicit pain on adduction and hip extension limiting strength assessment.  Hip flexion past 90 degrees with knee flexed increases left buttocks pain and as she actively extends her knee with hip flexed 90 degrees this does not increase left buttocks pain but she reports some tightness in her mid left thigh and some lumbosacral back pain.  FADIR's test elicits mild inguinal pain and increased posterolateral buttocks pain, FABER's test is negative for SI pain, scours test negative.  No tenderness is a palpate the anterolateral hip joint anteriorly but she does have some mild to moderate tenderness in the gluteus medius insertion and deep gluteal space over the  piriformis and this does radiate some pain to the posterolateral mid thigh.  Positive tenderness over the ischial tuberosity which is same intensity as tenderness in the deep gluteal space.  No SI tenderness to palpation but positive tenderness to percussion.  Pedal pulses are symmetrical bilaterally and she has no calf tenderness or swelling.  X-ray-reviewed AP pelvis x-rays dated 05/03/2021 showing some sclerosis and spurring at the [facility] joint left.  In the right but no other acute or chronic bony abnormalities.  Lumbar back x-rays dated 05/17/2021 show multilevel degenerative disc disease and marked lumbar facet OA (see radiology report)  MRI-MRI pelvis dated 05/24/2021 show a full-thickness chronic tear of the adductor magnus with tendon retraction and scarring and high-grade partial-thickness tears of bilateral semimembranosus and conjoined tendon.  (See radiology report)       Assessment and Plan:  1. Strain with high grade tear of multiple left hamstring muscles, initial encounter  UKP PRP    POC [place] NO READ   2. DDD (degenerative disc disease), lumbosacral     3. Greater trochanteric pain syndrome of left lower extremity     4. Lumbar back pain with radiculopathy affecting left lower extremity       I had a lengthy discussion with patient and her caregiver that PRP injections do not provide immediate pain relief and if that is her primary goal then there are other options for care.  Certainly proceeding with further lumbosacral spine care with Kristen Vaughn is an option as some of her pain can be referred pain.  Other option is a diagnostic lidocaine/ropivacaine injection under ultrasound guidance today to see if pain is reduced and if so how much.  If significant pain reduction then would consider PRP injection as long as patient understands this can take a significant amount of time to provide enhancement of soft tissue healing and she will never have complete healing of her hamstring tears.  Agree that physical therapy is essential, aqua therapy for her lumbosacral back and more focused therapeutic exercise for her proximal hamstrings and entire lower extremity.  After this discussion shared decision making allowed patient to desire trying the diagnostic lidocaine/ropivacaine injection (see separate procedure note).  After visit summary reviewed and patient expressed full understanding of this plan.       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  Activity modification: Continue using wheeled walker for assisted ambulation and reduction in fall risk, avoid prolonged sitting.  2.  Medication: No new medications prescribed and okay for refills by Kristen Vaughn for your Emla cream (office contacted by Aundra Millet, ATC today)  3.  Therapy: Follow through with first physical therapy visit as from your description this will include aqua therapy and more focused therapeutic exercise for lumbosacral back and hamstring back in [place], [place].  4.  Intervention: Ice frequently 10 X / day for 10 minutes better than 1 X for 100 min.  Diagnostic lidocaine/ropivacaine test today to determine how much pain is coming from proximal hamstring versus other pain generators such as lumbosacral back, greater trochanteric syndrome, and sciatica.  5.  Follow up: By MyChart message tomorrow updating me on pain pattern based on today's injection.  If significant reduction in pain returns with ropivacaine wears off then will consider PRP (platelet rich plasma) injection, however as advised these  injections are painful and did not provide immediate relief and per best research studies can take up to 6 months for full efficacy.  Agree with Dr. Susy Frizzle Vaughn that surgical intervention has relative contraindications because of your chronic lumbosacral back pain and tobaccoism.       If you are signed up for MyChart (a secure internal messaging system) then you can message me with questions or concerns.  You can also call my Clinical Athletic Trainer Aundra Millet, ATC) at [phone] if you prefer leaving a message for a call back.   If you need assistance signing up for MyChart please contact us.    For follow-up appointments please call the appointment line [phone].    Teena Irani. Katrinka Blazing, M.D.  Associate Professor  Wellsite geologist, Youth Sports Medicine  [employer], Sports Medicine and [employer]       Portions of this clinic note were completed using a Sales executive software system so please excuse any typos/misspellings/grammatical errors.  If you need clarification please contact my office.

## 2021-07-18 NOTE — Procedures
Tendon Injection  Location: - origin/insertion site    Consent:   Consent obtained: verbal  Consent given by: patient  Risks discussed: Bleeding, Damage to surrounding structures, Infection and Pain  Alternatives discussed: alternative treatment, delayed treatment, no treatment and referral     Universal Protocol:  Relevant documents: relevant documents present and verified  Imaging studies: imaging studies available  Site marked: the operative site was marked  Patient identity confirmed: Patient identify confirmed verbally with patient.        Time out: Immediately prior to procedure a time out was called to verify the correct patient, procedure, equipment, support staff and site/side marked as required      Procedures Details:       : left proximal hamstring.      Prep: 2% chlorhexidine  Local anesthetic: ethyl chloride spray and subcutaneous lidocaine 1% 10mg /mL  Approach: posterior.  Medications administered: 5 mL ropivacaine (PF) 0.5% (5 mg/mL)  Patient tolerance: Patient tolerated the procedure well with no immediate complications. Pressure was applied, and hemostasis was accomplished.  Comments: With the patient in the right lateral decubitus position prepped and draped by Megan, ATC, an appropriate site targeting the ischial tuberosity was scanned with curvilinear large ultrasound probe to identify ischial tuberosity and overlying bursa and hamstring origin. The area was marked with a sterile skin marking pen.   This area was again prepped using aseptic technique. Superficial local anesthesia with ethyl chloride spray was performed and 1% lidocaine (10 cc) injected from epidermis to ischial tuberosity and origin of proximal hamstring tendons and notable scar tissue. This was followed with placement of a 22-gauge spinal needle into the entire proximal hamstring tendon and sheath adjacent to the ischial tuberosity under US guidance and injected with the ropivicaine watching infiltration into the soft tissue distributing multiple aliquots throughout the soft tissue.  Gentle palm massage of the ischial tuberosity region followed and patient had significant reduction of pain immediately postinjection.  The procedure was performed without complication.  Observation and discussion 10 minutes after injection notable for marked reduction of pain and patient able to fully stand in tandem stance and take a few steps without the assistance of her wheeled walker and observed to have no significant standing strength deficits.      AVS completed and reviewed with patient.

## 2021-07-27 ENCOUNTER — Encounter: Admit: 2021-07-27 | Discharge: 2021-07-27 | Payer: MEDICARE

## 2021-07-28 ENCOUNTER — Encounter: Admit: 2021-07-28 | Discharge: 2021-07-28 | Payer: MEDICARE

## 2021-07-28 NOTE — Telephone Encounter
Patient left VM requesting follow up. Attempt to call back. VML

## 2021-07-31 ENCOUNTER — Encounter: Admit: 2021-07-31 | Discharge: 2021-07-31 | Payer: MEDICARE

## 2021-07-31 MED ORDER — PREGABALIN 200 MG PO CAP
200 mg | ORAL_CAPSULE | Freq: Three times a day (TID) | ORAL | 2 refills
Start: 2021-07-31 — End: ?

## 2021-07-31 NOTE — Telephone Encounter
Patient calling for refill of lyrica. Patient also questioning if Dr Katrinka Blazing had conversation with Dr Felicie Morn regarding hamstring injection, reaching out to Dr Katrinka Blazing regarding this. Routing refill request.    Last fill 06/02/21, LOV 05/25/21.

## 2021-08-08 ENCOUNTER — Encounter: Admit: 2021-08-08 | Discharge: 2021-08-08 | Payer: MEDICARE

## 2021-08-28 NOTE — Patient Instructions
It was a pleasure seeing you in clinic today.    Tenna Lacko RN, CNC  Dr. Joshua T. Bunch/Ortho Spine Surgery  [facility]  [facility]  [address]  [place], [place] [address]  Phone: [phone]   Fax [phone]  Scheduling [phone]  [email or web address]    We appreciate the interest in your health.  The physician reviews both the radiology reports and the imaging independently and will contact you if there are any emergent findings.  If you would like, incidental and chronic changes seen in your images can be reviewed with Dr. Bunch at a follow up appointment.  At that appointment, the radiology report and terminology used in that report can also be explained. Please call our Schedule line if you would like to follow up.

## 2021-08-29 ENCOUNTER — Encounter: Admit: 2021-08-29 | Discharge: 2021-08-29 | Payer: MEDICARE

## 2021-08-29 ENCOUNTER — Ambulatory Visit: Admit: 2021-08-29 | Discharge: 2021-08-29 | Payer: MEDICARE

## 2021-08-29 DIAGNOSIS — G5600 Carpal tunnel syndrome, unspecified upper limb: Secondary | ICD-10-CM

## 2021-08-29 DIAGNOSIS — Z86010 Personal history of colonic polyps: Secondary | ICD-10-CM

## 2021-08-29 DIAGNOSIS — S76312A Strain of muscle, fascia and tendon of the posterior muscle group at thigh level, left thigh, initial encounter: Secondary | ICD-10-CM

## 2021-08-29 DIAGNOSIS — M797 Fibromyalgia: Secondary | ICD-10-CM

## 2021-08-29 DIAGNOSIS — Z8679 Personal history of other diseases of the circulatory system: Secondary | ICD-10-CM

## 2021-08-29 DIAGNOSIS — M5020 Other cervical disc displacement, unspecified cervical region: Secondary | ICD-10-CM

## 2021-08-29 DIAGNOSIS — I10 Essential (primary) hypertension: Secondary | ICD-10-CM

## 2021-08-29 DIAGNOSIS — G959 Disease of spinal cord, unspecified: Secondary | ICD-10-CM

## 2021-08-29 DIAGNOSIS — Z8614 Personal history of Methicillin resistant Staphylococcus aureus infection: Secondary | ICD-10-CM

## 2021-08-29 DIAGNOSIS — M4802 Spinal stenosis, cervical region: Secondary | ICD-10-CM

## 2021-08-29 DIAGNOSIS — 1 ERRONEOUS ENCOUNTER--DISREGARD: Secondary | ICD-10-CM

## 2021-08-29 DIAGNOSIS — K219 Gastro-esophageal reflux disease without esophagitis: Secondary | ICD-10-CM

## 2021-08-29 DIAGNOSIS — K5900 Constipation, unspecified: Secondary | ICD-10-CM

## 2021-08-29 DIAGNOSIS — E785 Hyperlipidemia, unspecified: Secondary | ICD-10-CM

## 2021-08-29 DIAGNOSIS — F419 Anxiety disorder, unspecified: Secondary | ICD-10-CM

## 2021-08-29 DIAGNOSIS — J189 Pneumonia, unspecified organism: Secondary | ICD-10-CM

## 2021-08-29 NOTE — Patient Instructions
Sorry for the inconvenience that we cannot proceed with PRP (platelet rich plasma) injection into your proximal left hamstring tendon as the Celebrex you are taking will impair maximal efficacy of these PRP injections.  Therefore stop your Celebrex for 2 weeks and we will reschedule your PRP injection.  He will need to remain off Celebrex for 2 weeks after the injection.  Further instructions postinjection will be given to at the time of your next procedure appointment.  There will be no charge for today's appointment.    Kristen Vaughn. Katrinka Blazing, MD  Associate Professor  Medical Director, [facility] Sports Medicine

## 2021-08-29 NOTE — Progress Notes
Kristen Vaughn returns accompanied by her friend(Kristen Vaughn) hopeful to proceed with left proximal hamstring PRP injection.  I have been in communication with Dr. Curly Rim who is also working with her on determining her pain generators for the chronic buttocks pain.  She did have a reduction in pain with the diagnostic lidocaine/ropivacaine injection I did about 6 weeks ago.  Unfortunately, she is still taking Celebrex and for maximal PRP efficacy patients need to be off NSAIDs 2 weeks prior to the injection as well as 2 weeks postinjection.  She was not aware of that.  She does take a prophylactic baby aspirin a day and I have chosen not to have patient's discontinue that due to the potential risks while off aspirin.  Thus patient will reschedule her appointment for the PRP injection in 2 weeks.  I did have a good discussion with her about expectations regarding PRP injections as they are designed to help heal injured soft tissue.  She can expect a slight increase in pain postinjection as we use leukocyte rich PRP which can cause some inflammation and increase in pain.  She will continue following with Dr. Curly Rim.  She also has an appointment with Dr. Darden Palmer tomorrow for some increase in her cervical spine pain.  There is no charge for this visit today.    After visit summary as follows:    Sorry for the inconvenience that we cannot proceed with PRP (platelet rich plasma) injection into your proximal left hamstring tendon as the Celebrex you are taking will impair maximal efficacy of these PRP injections.  Therefore stop your Celebrex for 2 weeks and we will reschedule your PRP injection.  He will need to remain off Celebrex for 2 weeks after the injection.  Further instructions postinjection will be given to at the time of your next procedure appointment.  There will be no charge for today's appointment.    Teena Irani. Katrinka Blazing, MD  Associate Professor  Medical Director, Youth Sports Medicine      This encounter was created in error. Please disregard.

## 2021-08-30 ENCOUNTER — Encounter: Admit: 2021-08-30 | Discharge: 2021-08-30 | Payer: MEDICARE

## 2021-08-30 ENCOUNTER — Ambulatory Visit: Admit: 2021-08-30 | Discharge: 2021-08-30 | Payer: MEDICARE

## 2021-08-30 DIAGNOSIS — G959 Disease of spinal cord, unspecified: Secondary | ICD-10-CM

## 2021-08-30 DIAGNOSIS — Z86010 Personal history of colonic polyps: Secondary | ICD-10-CM

## 2021-08-30 DIAGNOSIS — M797 Fibromyalgia: Secondary | ICD-10-CM

## 2021-08-30 DIAGNOSIS — M4316 Spondylolisthesis, lumbar region: Secondary | ICD-10-CM

## 2021-08-30 DIAGNOSIS — K219 Gastro-esophageal reflux disease without esophagitis: Secondary | ICD-10-CM

## 2021-08-30 DIAGNOSIS — K5900 Constipation, unspecified: Secondary | ICD-10-CM

## 2021-08-30 DIAGNOSIS — Z8614 Personal history of Methicillin resistant Staphylococcus aureus infection: Secondary | ICD-10-CM

## 2021-08-30 DIAGNOSIS — Z981 Arthrodesis status: Secondary | ICD-10-CM

## 2021-08-30 DIAGNOSIS — J189 Pneumonia, unspecified organism: Secondary | ICD-10-CM

## 2021-08-30 DIAGNOSIS — M4802 Spinal stenosis, cervical region: Secondary | ICD-10-CM

## 2021-08-30 DIAGNOSIS — Z8679 Personal history of other diseases of the circulatory system: Secondary | ICD-10-CM

## 2021-08-30 DIAGNOSIS — M5020 Other cervical disc displacement, unspecified cervical region: Secondary | ICD-10-CM

## 2021-08-30 DIAGNOSIS — E785 Hyperlipidemia, unspecified: Secondary | ICD-10-CM

## 2021-08-30 DIAGNOSIS — G5600 Carpal tunnel syndrome, unspecified upper limb: Secondary | ICD-10-CM

## 2021-08-30 DIAGNOSIS — M5416 Radiculopathy, lumbar region: Secondary | ICD-10-CM

## 2021-08-30 DIAGNOSIS — F419 Anxiety disorder, unspecified: Secondary | ICD-10-CM

## 2021-08-30 DIAGNOSIS — I10 Essential (primary) hypertension: Secondary | ICD-10-CM

## 2021-08-30 NOTE — Progress Notes
[facility] CLINIC NOTE    Subjective     SUBJECTIVE   Kristen Vaughn is a 60 y.o. female who returns today for repeat evaluation.  She complains of neck and low back pain.  Yesterday she had a shot of burning pain up the side of her neck.  This is occurring more frequently.  She notes she is dropping things again.         REVIEW OF SYSTEMS   Review of Systems   Musculoskeletal: Positive for arthralgias and back pain.   All other systems reviewed and are negative.      ALLERGIES     Allergies   Allergen Reactions   ? Ascorbic Acid UNKNOWN   ? Erythromycin RASH     Rash on trunk   ? Strawberry UNKNOWN       MEDICATIONS     Current Outpatient Medications on File Prior to Visit   Medication Sig Dispense Refill   ? acetaminophen (TYLENOL) 500 mg tablet Take two tablets by mouth every 6 hours as needed for Pain. Max of 4,000 mg of acetaminophen in 24 hours. 500 tablet 1   ? albuterol sulfate (PROAIR HFA) 90 mcg/actuation HFA aerosol inhaler Inhale 2 Puffs by mouth into the lungs every 6 hours as needed for Wheezing or Shortness of Breath. Shake well before use.     ? AMITR/GABAPEN/EMU OIL 12/26/08% CREAM (COMPOUND) Apply 0.5grams to affected area three times daily PRN for pain 50 g 3   ? aspirin EC 81 mg tablet Take 81 mg by mouth daily. Take with food.     ? baclofen (LIORESAL) 10 mg tablet Take 10 mg by mouth at bedtime daily.     ? busPIRone (BUSPAR) 5 mg tablet Take 10 mg by mouth twice daily.     ? calcium carbonate (CALCIUM 600 PO) Take 1 tablet by mouth daily.     ? celecoxib (CELEBREX) 200 mg capsule Take 200 mg by mouth daily.     ? cetirizine (ZYRTEC) 10 mg tablet Take 10 mg by mouth daily as needed for Allergy symptoms.     ? Cholecalciferol (Vitamin D3) (VITAMIN D-3) 2,000 unit cap Take two capsules by mouth daily. (Patient taking differently: Take 1 capsule by mouth daily.) 300 capsule 11   ? clonazePAM (KLONOPIN) 1 mg tablet Take 0.5 mg by mouth daily as needed.     ? denosumab (PROLIA) 60 mg/mL injection Inject 60 mg under the skin every 180 days.     ? desvenlafaxine succinate (PRISTIQ) 100 mg tablet Take 50 mg by mouth daily.     ? diclofenac (VOLTAREN) 1 % topical gel Apply 4 g topically to affected area four times daily.     ? fish oil- omega 3-DHA/EPA 300/1,000 mg capsule Take 1 capsule by mouth daily.     ? ketoconazole (NIZORAL) 2 % topical cream Apply  topically to affected area twice daily. 60 g 11   ? lidocaine (LIDODERM) 5 % topical patch Apply one patch topically to affected area daily. Apply patch for 12 hours, then remove for 12 hours before repeating. 90 patch 3   ? lidocaine/prilocaine (EMLA) 2.5/2.5 % topical cream Apply  topically to affected area as Needed. 30 g 11   ? lisinopril (PRINIVIL; ZESTRIL) 10 mg tablet Take 10 mg by mouth daily.     ? mirtazapine (REMERON) 15 mg tablet Take 15 mg by mouth at bedtime daily.     ? mupirocin (BACTROBAN) 2 % topical ointment  Apply  topically to affected area twice daily. 22 g 11   ? omeprazole DR(+) (PRILOSEC) 40 mg capsule Take 40 mg by mouth daily before breakfast.     ? pregabalin (LYRICA) 200 mg capsule Take one capsule by mouth three times daily. 90 capsule 2   ? REXULTI 2 mg tablet      ? rosuvastatin (CRESTOR) 10 mg tablet TAKE ONE TABLET BY MOUTH DAILY. 90 tablet 3   ? triamcinolone acetonide (KENALOG) 0.1 % topical ointment APPLY TO AFFECTED AREA TWICE A DAY 30 g 3   ? walker (ULTRA-LIGHT ROLLATOR) medical supply Use as directed. 1 each 0     Current Facility-Administered Medications on File Prior to Visit   Medication Dose Route Frequency Provider Last Rate Last Admin   ? diazePAM (VALIUM) tablet 5 mg  5 mg Oral PRN [name], MD   5 mg at 04/27/19 1429       PHYSICAL EXAM     Vitals:    08/30/21 1140   BP: 116/78   Pulse: 59   Temp: 36.6 ?C (97.8 ?F)   Resp: 18   SpO2: 98%   TempSrc: Oral   PainSc: Seven   Weight: 103.9 kg (229 lb)   Height: 170.2 cm (5' 7)     Oswestry Total Score:: 72  Pain Score: Seven  Body mass index is 35.87 kg/m?Marland Kitchen    Constitutional: Alert, NAD  Head: Atraumatic  Eyes: EOMI  Respiratory: Unlabored breathing  Cardiovascular: Regular rate  Skin: No rashes or open wounds appreciated on back  Musculoskeletal: Strength stable  Neurologic: Sensation stable    RADIOLOGY     She has an L4-5 spondylolisthesis.  No significant central stenosis.  Some mild to moderate foraminal stenosis.         ASSESSMENT / PLAN   Kristen Vaughn is a 60 y.o. 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      From the standpoint of her low back, I don't have a solution that I think would provide her relief.  She can talk with Dr. Katrinka Blazing about any other options that she could try.  Again, she would need to be completely nicotine free before considering anything further procedures on her neck or back.  With the patient's nicotine use, this places her at significantly increased risk for surgical complications.  At this point we will plan to follow up with her in six months.          In the presence of Criss Rosales, MD , I have taken down these notes, [name], Scribe. August 30, 2021 12:21 PM

## 2021-10-02 ENCOUNTER — Encounter: Admit: 2021-10-02 | Discharge: 2021-10-02 | Payer: MEDICARE

## 2021-10-02 NOTE — Progress Notes
Patient arrived today for her PRP appointment. This ATC discussed with patient that we had been trying to reach her to reschedule this appointment due it being scheduled on the wrong day and that we were unable to do this procedure this morning. Appointment was rescheduled and patient was expressed understanding. MD 10/02/21

## 2021-10-10 ENCOUNTER — Encounter: Admit: 2021-10-10 | Discharge: 2021-10-10 | Payer: MEDICARE

## 2021-10-10 ENCOUNTER — Ambulatory Visit: Admit: 2021-10-10 | Discharge: 2021-10-10 | Payer: MEDICARE

## 2021-10-10 DIAGNOSIS — M797 Fibromyalgia: Secondary | ICD-10-CM

## 2021-10-10 DIAGNOSIS — F419 Anxiety disorder, unspecified: Secondary | ICD-10-CM

## 2021-10-10 DIAGNOSIS — K5900 Constipation, unspecified: Secondary | ICD-10-CM

## 2021-10-10 DIAGNOSIS — E785 Hyperlipidemia, unspecified: Secondary | ICD-10-CM

## 2021-10-10 DIAGNOSIS — Z8679 Personal history of other diseases of the circulatory system: Secondary | ICD-10-CM

## 2021-10-10 DIAGNOSIS — S76312D Strain of muscle, fascia and tendon of the posterior muscle group at thigh level, left thigh, subsequent encounter: Secondary | ICD-10-CM

## 2021-10-10 DIAGNOSIS — Z8614 Personal history of Methicillin resistant Staphylococcus aureus infection: Secondary | ICD-10-CM

## 2021-10-10 DIAGNOSIS — K219 Gastro-esophageal reflux disease without esophagitis: Secondary | ICD-10-CM

## 2021-10-10 DIAGNOSIS — J189 Pneumonia, unspecified organism: Secondary | ICD-10-CM

## 2021-10-10 DIAGNOSIS — G5600 Carpal tunnel syndrome, unspecified upper limb: Secondary | ICD-10-CM

## 2021-10-10 DIAGNOSIS — Z86010 Personal history of colonic polyps: Secondary | ICD-10-CM

## 2021-10-10 DIAGNOSIS — I10 Essential (primary) hypertension: Secondary | ICD-10-CM

## 2021-10-10 DIAGNOSIS — M5020 Other cervical disc displacement, unspecified cervical region: Secondary | ICD-10-CM

## 2021-10-10 DIAGNOSIS — M4802 Spinal stenosis, cervical region: Secondary | ICD-10-CM

## 2021-10-10 DIAGNOSIS — G959 Disease of spinal cord, unspecified: Secondary | ICD-10-CM

## 2021-10-10 DIAGNOSIS — S76312A Strain of muscle, fascia and tendon of the posterior muscle group at thigh level, left thigh, initial encounter: Secondary | ICD-10-CM

## 2021-10-10 NOTE — Procedures
UKP PRP  Location: Hip - origin/insertion site    Consent:   Consent obtained: verbal  Consent given by: patient  Risks discussed: Bleeding, Damage to surrounding structures, Infection and Pain  Alternatives discussed: alternative treatment, delayed treatment, no treatment and referral     Universal Protocol:  Imaging studies: imaging studies available  Required items: required blood products, implants, devices, and special equipment available  Site marked: the operative site was marked  Patient identity confirmed: Patient identify confirmed verbally with patient.        Time out: Immediately prior to procedure a time out was called to verify the correct patient, procedure, equipment, support staff and site/side marked as required      Procedures Details:   Procedure: PRP Only  Type: TENDON   JOINT LOCATION: Hip      Indications: pain and diagnostic evaluation   Prep: 2% chlorhexidine  Local anesthetic: ethyl chloride spray and subcutaneous lidocaine 1% 10mg /mL  Needle size: 22 G  Guidance: ultrasound  Justification for use of ultrasound guidance: The use of direct sonographic visualization of the needle (rather than a non-guided injection) was required to ensure accurate injection placement for diagnostic specificity, to maximize clinical efficacy and for safety purposes to minimize risk of bleeding or injury to nearby neurovascular structures.  Patient tolerance: Patient tolerated the procedure well with no immediate complications. Pressure was applied, and hemostasis was accomplished.  Comments: I reviewed patient history and performed a brief examination of her left hip to confirm indications and lack of contraindications for the proposed procedure.  I reviewed the procedure in depth, including potential risks and benefits, and patient expressed full understanding and gave verbal consent to proceed today.  She is accompanied by her caretaker and friend [name]) during the entire encounter.  Grasiela continues to report severe daily pain altering her ADLs and remains under the care also of Dr. Curly Rim for her chronic lumbosacral back pain with radiculopathy.  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  Initial ultrasound scanning today did show ischial tuberosity bursal swelling and some adjacent tearing of the proximal hamstring origin.  Patient admits she is highly anxious about this procedure but after full explanation and initial scanning she was much more relaxed and lidocaine was utilized for local anesthetic in addition to topical ethyl chloride spray.  Patient actually fell asleep during the procedure yet did respond with palpation over the ischial tuberosity as well as ultrasound probe pressure as I was scanning prior to and during the injection.    RN assisted with venipuncture and [place], Charity fundraiser and [place], ATC processed the specimen per [employer] protocol.  Using aseptic technique under ethyl chloride and lidocaine 1% (8cc) local anesthetic 3.5 mL of leukocyte rich PRP injected using anatomic landmarks and US guidance into the left hamstring origin at the ischial tuberosity and no complications occurred.         After  visit summary completed and patient expresses full understanding.             Blood Drawn Amount: 80    Leukocyte Rich    Kit Used: [employer]

## 2021-11-07 ENCOUNTER — Ambulatory Visit: Admit: 2021-11-07 | Discharge: 2021-11-07 | Payer: MEDICARE

## 2021-11-07 ENCOUNTER — Encounter: Admit: 2021-11-07 | Discharge: 2021-11-07 | Payer: MEDICARE

## 2021-11-07 DIAGNOSIS — E785 Hyperlipidemia, unspecified: Secondary | ICD-10-CM

## 2021-11-07 DIAGNOSIS — G959 Disease of spinal cord, unspecified: Secondary | ICD-10-CM

## 2021-11-07 DIAGNOSIS — Z86010 Personal history of colonic polyps: Secondary | ICD-10-CM

## 2021-11-07 DIAGNOSIS — K219 Gastro-esophageal reflux disease without esophagitis: Secondary | ICD-10-CM

## 2021-11-07 DIAGNOSIS — K5900 Constipation, unspecified: Secondary | ICD-10-CM

## 2021-11-07 DIAGNOSIS — J449 Chronic obstructive pulmonary disease, unspecified: Secondary | ICD-10-CM

## 2021-11-07 DIAGNOSIS — R0602 Shortness of breath: Secondary | ICD-10-CM

## 2021-11-07 DIAGNOSIS — Z8614 Personal history of Methicillin resistant Staphylococcus aureus infection: Secondary | ICD-10-CM

## 2021-11-07 DIAGNOSIS — M4802 Spinal stenosis, cervical region: Secondary | ICD-10-CM

## 2021-11-07 DIAGNOSIS — Z8679 Personal history of other diseases of the circulatory system: Secondary | ICD-10-CM

## 2021-11-07 DIAGNOSIS — Z720 Tobacco use: Secondary | ICD-10-CM

## 2021-11-07 DIAGNOSIS — F419 Anxiety disorder, unspecified: Secondary | ICD-10-CM

## 2021-11-07 DIAGNOSIS — I10 Essential (primary) hypertension: Secondary | ICD-10-CM

## 2021-11-07 DIAGNOSIS — G5600 Carpal tunnel syndrome, unspecified upper limb: Secondary | ICD-10-CM

## 2021-11-07 DIAGNOSIS — J189 Pneumonia, unspecified organism: Secondary | ICD-10-CM

## 2021-11-07 DIAGNOSIS — M5020 Other cervical disc displacement, unspecified cervical region: Secondary | ICD-10-CM

## 2021-11-07 DIAGNOSIS — M797 Fibromyalgia: Secondary | ICD-10-CM

## 2021-11-07 NOTE — Patient Instructions
-  We will refer you to our low dose cancer screening program. There office will contact you to schedule within the next 2 weeks.  -Please contact your primary care doctor regarding your back pain for further evaluation.    Clinic Visit Summary:   Please contact Pulmonary Nurse Coordinator with signs and symptoms of worsening productive cough with thick secretions, blood in sputum, chest tightness/pain, shortness of breath, fever, chills, night sweats, or any questions or concerns.   Pulmonary RN Coordinator-Chattie Greeson Chales Salmon RN [phone] [phone]   For refills on medications, please have your pharmacy fax a refill authorization request form to our office at Fax) [phone]. Please allow at least 3 business days for refill requests.   For urgent issues after business hours/weekends/holidays call [phone] and request for the pulmonary fellow to be paged

## 2021-11-08 ENCOUNTER — Encounter: Admit: 2021-11-08 | Discharge: 2021-11-08 | Payer: MEDICARE

## 2021-11-08 DIAGNOSIS — Z86010 Personal history of colonic polyps: Secondary | ICD-10-CM

## 2021-11-08 DIAGNOSIS — G5600 Carpal tunnel syndrome, unspecified upper limb: Secondary | ICD-10-CM

## 2021-11-08 DIAGNOSIS — M4802 Spinal stenosis, cervical region: Secondary | ICD-10-CM

## 2021-11-08 DIAGNOSIS — G959 Disease of spinal cord, unspecified: Secondary | ICD-10-CM

## 2021-11-08 DIAGNOSIS — I10 Essential (primary) hypertension: Secondary | ICD-10-CM

## 2021-11-08 DIAGNOSIS — M797 Fibromyalgia: Secondary | ICD-10-CM

## 2021-11-08 DIAGNOSIS — Z8679 Personal history of other diseases of the circulatory system: Secondary | ICD-10-CM

## 2021-11-08 DIAGNOSIS — K219 Gastro-esophageal reflux disease without esophagitis: Secondary | ICD-10-CM

## 2021-11-08 DIAGNOSIS — E785 Hyperlipidemia, unspecified: Secondary | ICD-10-CM

## 2021-11-08 DIAGNOSIS — J189 Pneumonia, unspecified organism: Secondary | ICD-10-CM

## 2021-11-08 DIAGNOSIS — M5020 Other cervical disc displacement, unspecified cervical region: Secondary | ICD-10-CM

## 2021-11-08 DIAGNOSIS — K5900 Constipation, unspecified: Secondary | ICD-10-CM

## 2021-11-08 DIAGNOSIS — Z8614 Personal history of Methicillin resistant Staphylococcus aureus infection: Secondary | ICD-10-CM

## 2021-11-08 DIAGNOSIS — F419 Anxiety disorder, unspecified: Secondary | ICD-10-CM

## 2021-11-09 ENCOUNTER — Encounter: Admit: 2021-11-09 | Discharge: 2021-11-09 | Payer: MEDICARE

## 2021-11-09 NOTE — Telephone Encounter
Patient referred for low-dose lung screening CT scan.    Contacted patient to complete screening for high -risk criteria as follows:      Patient contacted: No, unable to reach LMTCB

## 2021-11-16 ENCOUNTER — Encounter: Admit: 2021-11-16 | Discharge: 2021-11-16 | Payer: MEDICARE

## 2021-11-17 ENCOUNTER — Encounter: Admit: 2021-11-17 | Discharge: 2021-11-17 | Payer: MEDICARE

## 2021-11-20 ENCOUNTER — Encounter: Admit: 2021-11-20 | Discharge: 2021-11-20 | Payer: MEDICARE

## 2021-11-21 ENCOUNTER — Encounter: Admit: 2021-11-21 | Discharge: 2021-11-21 | Payer: MEDICARE

## 2021-11-21 ENCOUNTER — Ambulatory Visit: Admit: 2021-11-21 | Discharge: 2021-11-21 | Payer: MEDICARE

## 2021-11-21 DIAGNOSIS — I10 Essential (primary) hypertension: Secondary | ICD-10-CM

## 2021-11-21 DIAGNOSIS — G959 Disease of spinal cord, unspecified: Secondary | ICD-10-CM

## 2021-11-21 DIAGNOSIS — E785 Hyperlipidemia, unspecified: Secondary | ICD-10-CM

## 2021-11-21 DIAGNOSIS — Z8679 Personal history of other diseases of the circulatory system: Secondary | ICD-10-CM

## 2021-11-21 DIAGNOSIS — M797 Fibromyalgia: Secondary | ICD-10-CM

## 2021-11-21 DIAGNOSIS — Z86010 Personal history of colonic polyps: Secondary | ICD-10-CM

## 2021-11-21 DIAGNOSIS — J189 Pneumonia, unspecified organism: Secondary | ICD-10-CM

## 2021-11-21 DIAGNOSIS — G5600 Carpal tunnel syndrome, unspecified upper limb: Secondary | ICD-10-CM

## 2021-11-21 DIAGNOSIS — K5900 Constipation, unspecified: Secondary | ICD-10-CM

## 2021-11-21 DIAGNOSIS — Z8614 Personal history of Methicillin resistant Staphylococcus aureus infection: Secondary | ICD-10-CM

## 2021-11-21 DIAGNOSIS — M5020 Other cervical disc displacement, unspecified cervical region: Secondary | ICD-10-CM

## 2021-11-21 DIAGNOSIS — K219 Gastro-esophageal reflux disease without esophagitis: Secondary | ICD-10-CM

## 2021-11-21 DIAGNOSIS — F419 Anxiety disorder, unspecified: Secondary | ICD-10-CM

## 2021-11-21 DIAGNOSIS — M4802 Spinal stenosis, cervical region: Secondary | ICD-10-CM

## 2021-11-21 DIAGNOSIS — M5416 Radiculopathy, lumbar region: Secondary | ICD-10-CM

## 2021-11-21 MED ORDER — IOHEXOL 240 MG IODINE/ML IV SOLN
1 mL | Freq: Once | EPIDURAL | 0 refills | Status: CP
Start: 2021-11-21 — End: ?
  Administered 2021-11-21: 22:00:00 1 mL via EPIDURAL

## 2021-11-21 MED ORDER — DIAZEPAM 5 MG PO TAB
5 mg | ORAL | 0 refills | Status: DC | PRN
Start: 2021-11-21 — End: 2021-11-22
  Administered 2021-11-21: 22:00:00 5 mg via ORAL

## 2021-11-21 MED ORDER — TRIAMCINOLONE ACETONIDE 40 MG/ML IJ SUSP
80 mg | Freq: Once | EPIDURAL | 0 refills | Status: CP
Start: 2021-11-21 — End: ?
  Administered 2021-11-21: 22:00:00 80 mg via EPIDURAL

## 2021-11-21 NOTE — Patient Instructions
Procedure Completed Today: Lumbar Epidural Steroid Injection    Important information following your procedure today: You may NOT drive today    Pain relief may not be immediate. It is possible you may even experience an increase in pain during the first 24-48 hours followed by a gradual decrease of your pain.  Though the procedure is generally safe and complications are rare, we do ask that you be aware of any of the following:   Any swelling, persistent redness, new bleeding, or drainage from the site of the injection.  You should not experience a severe headache.  You should not run a fever over 101? F.  New onset of sharp, severe back & or neck pain.  New onset of upper or lower extremity numbness or weakness.  New difficulty controlling bowel or bladder function after the injection.  New shortness of breath.    If any of these occur, please call to report this occurrence to a nurse at [phone]. If you are calling after 4:00 p.m., on weekends or holidays please call [phone] and ask to have the resident physician on call for the physician paged or go to your local emergency room.  You may experience soreness at the injection site. Ice can be applied at 20 minute intervals. Avoid application of direct heat, hot showers or hot tubs today.  Avoid strenuous activity today. You may resume your regular activities and exercise tomorrow.  Patients with diabetes may see an elevation in blood sugars for 7-10 days after the injection. It is important to pay close attention to your diet, check your blood sugars daily and report extreme elevations to the physician that treats your diabetes.  Patients taking a daily blood thinner can resume their regular dose this evening.  It is important that you take all medications ordered by your pain physician. Taking medication as ordered is an important part of your pain care plan. If you cannot continue the medication plan, please notify the physician.     Possible side effects to steroids that may occur:  Flushing or redness of the face  Irritability  Fluid retention  Change in women?s menses    The following medications were used: Lidocaine , Triamcinolone  , Contrast Dye, and Others Valium

## 2021-11-21 NOTE — Progress Notes

## 2021-11-21 NOTE — Progress Notes
[facility]ENTER  INTERVENTIONAL PAIN PROCEDURE HISTORY AND PHYSICAL    Chief Complaint: Pain    HISTORY OF PRESENT ILLNESS:  Kristen Vaughn is a 61 y.o. year old female who presents for injection.  Denies fevers, chills, or recent hospitalizations.          Medical History:   Diagnosis Date   ? Anxiety and depression    ? Carpal tunnel syndrome    ? Cervical myelopathy (HCC)    ? Cervical stenosis of spine    ? Chest pain    ? Constipation    ? Essential hypertension    ? Fibromyalgia    ? GERD (gastroesophageal reflux disease)    ? Herniated disc, cervical    ? History of abnormal electrocardiogram 01/2016   ? History of colon polyps 2015   ? History of MRSA infection 2019    right hand/[facility]   ? Hyperlipemia 08/09/2009   ? Pneumonia        Surgical History:   Procedure Laterality Date   ? HX HYSTERECTOMY  1985   ? UPPER GASTROINTESTINAL ENDOSCOPY  2015   ? COLONOSCOPY  2015    hx colon polyps x2   ? CARDIOVASCULAR STRESS TEST  01/2016   ? FINGER TRIGGER RELEASE Right 2019   ? CARPAL TUNNEL RELEASE Right 2019   ? ANTERIOR CERVICAL DECOMPRESSION AND FUSION AT CERVICAL 6-7 N/A 10/28/2018    Performed by Criss Rosales, MD at [facility] OR   ? ARTHRODESIS SPINE ANTERIOR INTERBODY WITH DISCECTOMY/ OSTEOPHYTECTOMY/ DECOMPRESSION - CERVICAL BELOW C2 - EACH ADDITIONAL INTERSPACE N/A 10/28/2018    Performed by Criss Rosales[facility] MD at [facility] [place]   ? ANTERIOR INSTRUMENTATION - 2 TO 3 VERTEBRAL SEGMENTS N/A 10/28/2018    Performed by Criss Rosales, MD at [facility] OR   ? DILATION AND CURETTAGE  1980's       family history includes Cancer in her sister and another family member; Coronary Artery Disease in her father, mother, and other family members; Premature Heart Disease in an other family member; Stroke in an other family member.    Social History     Socioeconomic History   ? Marital status: Single   Tobacco Use   ? Smoking status: Some Days     Packs/day: 0.50     Years: 40.00     Pack years: 20.00     Types: Cigarettes   ? Smokeless tobacco: Never   ? Tobacco comments:     variable 1-1/2 a day, usually 1 or 2 per day.   Substance and Sexual Activity   ? Alcohol use: Yes     Alcohol/week: 0.0 standard drinks     Comment: social   ? Drug use: Yes     Types: Marijuana     Comment: rare for back pain       Allergies   Allergen Reactions   ? Ascorbic Acid UNKNOWN   ? Erythromycin RASH     Rash on trunk   ? Strawberry UNKNOWN       Vitals:    11/21/21 1542   BP: 127/89   BP Source: Arm, Right Upper   Pulse: 74   Temp: 36.6 ?C (97.8 ?F)   Resp: 16   SpO2: 95%   TempSrc: Oral   PainSc: Eight   Weight: 102.1 kg (225 lb)   Height: 170.2 cm (5' 7)     Pain Score: Eight       REVIEW  OF SYSTEMS: 10 point ROS obtained and negative except for back pain      PHYSICAL EXAM:  General: 61 y.o. female appears stated age, in no acute distress  HEENT: Normocephalic, atraumatic  Neck: No thyroidmegaly  Cardiovascular: Well perfused  Pulmonary: Unlabored respirations  Extremities: No cyanosis, clubbing, or edema  Skin: No lesions seen on exposed skin  Psychiatric:  Appropriate mood and affect  Musculoskeletal: No atrophy.   Neurologic: Antigravity strength in all extremities. CN II -XII grossly intact.  Alert and oriented x 3.          IMPRESSION:  No diagnosis found.     PLAN:   L4-5 ILESI

## 2021-11-21 NOTE — Procedures
Attending Surgeon: [name], MD    Anesthesia: Local    Pre-Procedure Diagnosis: No diagnosis found.    Post-Procedure Diagnosis: No diagnosis found.    Pain Score: Eight    [place] [facility] SPINE INJECT INTERLAM LMBR/SAC  Procedure: epidural - interlaminar    Laterality: n/a   on 11/21/2021 3:30 PM  Location: lumbar ESI with imaging -  L4-5      Consent:   Consent obtained: verbal and written  Consent given by: patient  Risks discussed: allergic reaction, bleeding, bruising, infection, nerve damage, no change or worsening in pain and reaction to medication    Discussed with patient the purpose of the treatment/procedure, other ways of treating my condition, including no treatment/ procedure and the risks and benefits of the alternatives. Patient has decided to proceed with treatment/procedure.        Universal Protocol:  Relevant documents: relevant documents present and verified  Test results: test results available and properly labeled  Imaging studies: imaging studies available  Required items: required blood products, implants, devices, and special equipment available  Site marked: the operative site was marked  Patient identity confirmed: Patient identify confirmed verbally with patient.        Time out: Immediately prior to procedure a time out was called to verify the correct patient, procedure, equipment, support staff and site/side marked as required      Procedures Details:   Indications: pain   Prep: chlorhexidine  Patient position: prone  Estimated Blood Loss: minimal  Specimens: none  Number of Levels: 1  Guidance: fluoroscopy  Contrast: Procedure confirmed with contrast under live fluoroscopy.  Needle and Epidural Catheter: tuohy  Injection procedure: Incremental injection and Negative aspiration for blood  Patient tolerance: Patient tolerated the procedure well with no immediate complications. Pressure was applied, and hemostasis was accomplished.  Comments: DESCRIPTION OF PROCEDURE:  The procedure risks and benefits were explained to the patient and informed consent was obtained.  The patient was positioned prone on the fluoroscopy table with a pillow under the abdomen to help reduce lumbar lordosis.  Blood pressure cuff and oxygen saturation monitors were attached and the patient was monitored throughout the entire procedure.  The L5 vertebral level was identified with the use of fluoroscopy in the AP view.  The skin was prepped using Chlorhexadine and draped in aseptic fashion.  The skin and subcutaneous tissue were anesthetized using 3 mL of 1 percent lidocaine with a 27-gauge needle.  A  22-gauge Tuohy needle was slowly advanced using AP view towards the interlaminar space.  The latter part of the needle advancement was guided with fluoroscopy in the lateral view.  The epidural space was identified using loss of resistance technique.  After negative aspiration, 1 mL of Isovue contrast dye was injected.  After epidural spread was seen, a solution containing 80 mg of triamcinolone and 2 mL of normal saline was injected in increments.  The stylet was reinserted and the needle was then removed.     After the procedure, the patient's blood pressure, heart rate, oxygen saturation, and VAS were recorded in the chart. There were no complications.  The patient tolerated the procedure well and was brought to recovery room for observation in stable condition and discharged with written discharge instructions.     PLAN OF CARE:  The patient is to followup in 3 weeks.    The patient was advised to contact the [facility] for any of the following    Fever, chills,  or night sweats.  New onset of severe sharp pain.  Any new upper or lower extremity weakness or numbness.  Any questions regarding the procedure.    This patient's clinical history, exam, AND imaging support radiculopathy AND there is a significant impact on quality of life and function AND the pain has been present for at least 4 weeks AND they have failed to improve with noninvasive conservative care.    This patient failed to have improvement of their pain with epidural injection. Given the severity of their pain, a repeat injection is being performed at least 14 days later with a different Approach and Level.        Administrations This Visit     diazePAM (VALIUM) tablet 5 mg     Admin Date  11/21/2021 Action  Given Dose  5 mg Route  Oral Administered By  Gretta Arab, RN          iohexoL (OMNIPAQUE-240) 240 mg/mL injection 1 mL     Admin Date  11/21/2021 Action  Given Dose  1 mL Route  Epidural Administered By  [name], MD          triamcinolone acetonide [facility]) injection 80 mg     Admin Date  11/21/2021 Action  Given Dose  80 mg Route  Epidural Administered By  [name], MD              Estimated blood loss: none or minimal  Specimens: none  Patient tolerated the procedure well with no immediate complications. Pressure was applied, and hemostasis was accomplished.

## 2021-12-05 ENCOUNTER — Encounter: Admit: 2021-12-05 | Discharge: 2021-12-05 | Payer: MEDICARE

## 2021-12-05 NOTE — Telephone Encounter
Patient called Kristen Vaughn having pain and last injection didn't work and she wasn't sure what to do. Called patient, appt was scheduled for next week for evaluation offered sooner appt she reports she has to get transportation with caregiver and if she feels bad enough will go to ER, encouraged her to let us know if she wanted sooner appt pt verbalizes understanding.

## 2021-12-10 ENCOUNTER — Encounter: Admit: 2021-12-10 | Discharge: 2021-12-10 | Payer: MEDICARE

## 2021-12-10 MED ORDER — PREGABALIN 200 MG PO CAP
ORAL_CAPSULE | 2 refills
Start: 2021-12-10 — End: ?

## 2021-12-13 ENCOUNTER — Encounter: Admit: 2021-12-13 | Discharge: 2021-12-13 | Payer: MEDICARE

## 2021-12-13 ENCOUNTER — Ambulatory Visit: Admit: 2021-12-13 | Discharge: 2021-12-13 | Payer: MEDICARE

## 2021-12-13 DIAGNOSIS — Z8679 Personal history of other diseases of the circulatory system: Secondary | ICD-10-CM

## 2021-12-13 DIAGNOSIS — F419 Anxiety disorder, unspecified: Secondary | ICD-10-CM

## 2021-12-13 DIAGNOSIS — J189 Pneumonia, unspecified organism: Secondary | ICD-10-CM

## 2021-12-13 DIAGNOSIS — K219 Gastro-esophageal reflux disease without esophagitis: Secondary | ICD-10-CM

## 2021-12-13 DIAGNOSIS — M4802 Spinal stenosis, cervical region: Secondary | ICD-10-CM

## 2021-12-13 DIAGNOSIS — I10 Essential (primary) hypertension: Secondary | ICD-10-CM

## 2021-12-13 DIAGNOSIS — M797 Fibromyalgia: Secondary | ICD-10-CM

## 2021-12-13 DIAGNOSIS — G5600 Carpal tunnel syndrome, unspecified upper limb: Secondary | ICD-10-CM

## 2021-12-13 DIAGNOSIS — M4316 Spondylolisthesis, lumbar region: Secondary | ICD-10-CM

## 2021-12-13 DIAGNOSIS — M5020 Other cervical disc displacement, unspecified cervical region: Secondary | ICD-10-CM

## 2021-12-13 DIAGNOSIS — Z92241 Personal history of systemic steroid therapy: Secondary | ICD-10-CM

## 2021-12-13 DIAGNOSIS — E785 Hyperlipidemia, unspecified: Secondary | ICD-10-CM

## 2021-12-13 DIAGNOSIS — Z86010 Personal history of colonic polyps: Secondary | ICD-10-CM

## 2021-12-13 DIAGNOSIS — K5900 Constipation, unspecified: Secondary | ICD-10-CM

## 2021-12-13 DIAGNOSIS — M47816 Spondylosis without myelopathy or radiculopathy, lumbar region: Secondary | ICD-10-CM

## 2021-12-13 DIAGNOSIS — Z8614 Personal history of Methicillin resistant Staphylococcus aureus infection: Secondary | ICD-10-CM

## 2021-12-13 DIAGNOSIS — G959 Disease of spinal cord, unspecified: Secondary | ICD-10-CM

## 2021-12-13 NOTE — Progress Notes
[facility] CLINIC NOTE       SUBJECTIVE:   Kristen Vaughn is a 61 y.o.-year-old female with history of ACDF who presents for follow-up after L4-L5 interlaminar epidural steroid injection on 11/21/21. Patient reports no relief with the procedure. She has underwent PRP injection with Dr. Felicie Morn in January with significant relief in her leg pain. Her greatest pain is in the low back and sacral area. Pain is slightly more on the right. She has some intermittent radiation into the left leg but this is not her main pain. She has previously under went L4-L5 and L5-S1 facets injections with some relief.  She continues to take Lyrica with relief. She denies any adverse effects. Pain is at preprocedure level. VAS pain score is rated a 7/10. She has participated in formal physical therapy on several occasions without benefit. Denies recent falls. Denies any loss of control of bowel or bladder.        Review of Systems    Current Outpatient Medications:   ?  acetaminophen (TYLENOL) 500 mg tablet, Take two tablets by mouth every 6 hours as needed for Pain. Max of 4,000 mg of acetaminophen in 24 hours., Disp: 500 tablet, Rfl: 1  ?  albuterol sulfate (PROAIR HFA) 90 mcg/actuation HFA aerosol inhaler, Inhale two puffs by mouth into the lungs every 6 hours as needed for Wheezing or Shortness of Breath. Shake well before use., Disp: , Rfl:   ?  AMITR/GABAPEN/EMU OIL 12/26/08% CREAM (COMPOUND), Apply 0.5grams to affected area three times daily PRN for pain, Disp: 50 g, Rfl: 3  ?  aspirin EC 81 mg tablet, Take one tablet by mouth daily. Take with food., Disp: , Rfl:   ?  baclofen (LIORESAL) 10 mg tablet, Take one tablet by mouth twice daily., Disp: , Rfl:   ?  busPIRone (BUSPAR) 5 mg tablet, Take two tablets by mouth daily., Disp: , Rfl:   ?  calcium carbonate (CALCIUM 600 PO), Take 1 tablet by mouth daily., Disp: , Rfl:   ?  celecoxib (CELEBREX) 200 mg capsule, Take one capsule by mouth daily., Disp: , Rfl:   ?  cetirizine (ZYRTEC) 10 mg tablet, Take one tablet by mouth daily as needed for Allergy symptoms., Disp: , Rfl:   ?  Cholecalciferol (Vitamin D3) (VITAMIN D-3) 2,000 unit cap, Take two capsules by mouth daily. (Patient taking differently: Take one capsule by mouth daily.), Disp: 300 capsule, Rfl: 11  ?  clonazePAM (KLONOPIN) 1 mg tablet, Take one tablet by mouth daily as needed., Disp: , Rfl:   ?  denosumab (PROLIA) 60 mg/mL injection, Inject 1 mL under the skin every 180 days., Disp: , Rfl:   ?  desvenlafaxine succinate (PRISTIQ) 100 mg tablet, Take one tablet by mouth daily., Disp: , Rfl:   ?  diclofenac (VOLTAREN) 1 % topical gel, Apply four g topically to affected area four times daily., Disp: , Rfl:   ?  fish oil- omega 3-DHA/EPA 300/1,000 mg capsule, Take one capsule by mouth daily., Disp: , Rfl:   ?  ketoconazole (NIZORAL) 2 % topical cream, Apply  topically to affected area twice daily., Disp: 60 g, Rfl: 11  ?  lidocaine (LIDODERM) 5 % topical patch, Apply one patch topically to affected area daily. Apply patch for 12 hours, then remove for 12 hours before repeating., Disp: 90 patch, Rfl: 3  ?  lidocaine/prilocaine (EMLA) 2.5/2.5 % topical cream, Apply  topically to affected area as Needed., Disp: 30 g, Rfl:  11  ?  lisinopril (PRINIVIL; ZESTRIL) 10 mg tablet, Take one tablet by mouth daily., Disp: , Rfl:   ?  mirtazapine (REMERON) 15 mg tablet, Take one tablet by mouth at bedtime daily., Disp: , Rfl:   ?  mupirocin (BACTROBAN) 2 % topical ointment, Apply  topically to affected area twice daily., Disp: 22 g, Rfl: 11  ?  omeprazole DR(+) (PRILOSEC) 40 mg capsule, Take one capsule by mouth daily before breakfast., Disp: , Rfl:   ?  pregabalin (LYRICA) 200 mg capsule, TAKE 1 CAPSULE BY MOUTH THREE TIMES A DAY, Disp: 90 capsule, Rfl: 0  ?  REXULTI 2 mg tablet, Take one tablet by mouth daily., Disp: , Rfl:   ?  rosuvastatin (CRESTOR) 10 mg tablet, TAKE ONE TABLET BY MOUTH DAILY., Disp: 90 tablet, Rfl: 3  ?  triamcinolone acetonide (KENALOG) 0.1 % topical ointment, APPLY TO AFFECTED AREA TWICE A DAY, Disp: 30 g, Rfl: 3  ?  walker (ULTRA-LIGHT ROLLATOR) medical supply, Use as directed., Disp: 1 each, Rfl: 0    Current Facility-Administered Medications:   ?  diazePAM (VALIUM) tablet 5 mg, 5 mg, Oral, PRN, [name], MD, 5 mg at 04/27/19 1429  Allergies   Allergen Reactions   ? Ascorbic Acid UNKNOWN   ? Erythromycin RASH     Rash on trunk   ? Strawberry UNKNOWN     Physical Exam  Vitals:    12/13/21 1218   BP: 111/69   BP Source: Arm, Right Upper   Pulse: 86   Resp: 18   SpO2: 98%   PainSc: Seven   Weight: 103.4 kg (228 lb)   Height: 170.2 cm (5' 7)     Oswestry Total Score:: 78  Pain Score: Seven  Body mass index is 35.71 kg/m?Marland Kitchen  General: 61 y.o. female appears stated age, in no acute distress  HEENT: Normocephalic, atraumatic  Neck: No thyroidmegaly  Cardiovascular: Well perfused  Pulmonary: Unlabored respirations  Extremities: No cyanosis, clubbing, or edema  Skin: Warm and dry  Psychiatric:  Appropriate mood and affect  Musculoskeletal: Decreased range of motion with lumbar flexion, extension, and lateral rotation.  Tender to palpation at L4-L5 and L5-S1 facets and PSIS. Facet loading is positive bilaterally.    Neurologic: Lower extremity myotomes are all antigravity.  Lower extremity dermatomes are all intact to light touch.  Deep tendon reflexes are symmetric at patella and achilles.  Negative slump test bilaterally.    No ankle clonus.        IMPRESSION:  1. Lumbar spondylosis    2. Spondylolisthesis, lumbar region    3. S/P epidural steroid injection      PLAN:    1.  Lifestyle modifications.  Recommend activity as tolerated.  Avoid provocative maneuvers.  Keep spine in neutral position.  2.  Medications.  She may continue with Lyrica. No other changes indicated at this time. Medication usage and safety reviewed.   3.  Therapy.  She has participated in multiple rounds of formal physical therapy and home exercises without benefit.   4.  Imaging.  None indicated at this time.   5.  Interventions. Will discuss with Dr. Katrinka Blazing. May consider lumbar MBBs. If she has benefit, she would be a candidate for an ablation. Risk of the procedure were discussed including pain, bleeding, infection, and damage to nearby structures. She would like to proceed.  6.  Follow-up.  Patient to follow-up for possible injection.

## 2021-12-20 ENCOUNTER — Encounter: Admit: 2021-12-20 | Discharge: 2021-12-20 | Payer: MEDICARE

## 2022-01-15 ENCOUNTER — Encounter: Admit: 2022-01-15 | Discharge: 2022-01-15 | Payer: MEDICARE

## 2022-01-15 MED ORDER — LIDOCAINE-PRILOCAINE 2.5-2.5 % TP CREA
TOPICAL | 11 refills | Status: AC | PRN
Start: 2022-01-15 — End: ?

## 2022-01-15 MED ORDER — PREGABALIN 200 MG PO CAP
ORAL_CAPSULE | 0 refills
Start: 2022-01-15 — End: ?

## 2022-01-15 NOTE — Telephone Encounter
Refill request for lyrica received from pharmacy. Last fill 12/12/21, LOV 12/13/21, UOV none, routing to [facility] for consideration

## 2022-02-01 ENCOUNTER — Encounter: Admit: 2022-02-01 | Discharge: 2022-02-01 | Payer: MEDICARE

## 2022-02-05 ENCOUNTER — Encounter: Admit: 2022-02-05 | Discharge: 2022-02-05 | Payer: MEDICARE

## 2022-02-05 NOTE — Telephone Encounter
Called to resched appt lmom for pt listed sched number for pt to resched.

## 2022-02-06 ENCOUNTER — Ambulatory Visit: Admit: 2022-02-06 | Discharge: 2022-02-06 | Payer: MEDICARE

## 2022-02-06 ENCOUNTER — Encounter: Admit: 2022-02-06 | Discharge: 2022-02-06 | Payer: MEDICARE

## 2022-02-06 DIAGNOSIS — M47816 Spondylosis without myelopathy or radiculopathy, lumbar region: Secondary | ICD-10-CM

## 2022-02-06 MED ORDER — BUPIVACAINE (PF) 0.5 % (5 MG/ML) IJ SOLN
3 mL | Freq: Once | INTRAMUSCULAR | 0 refills | Status: CP
Start: 2022-02-06 — End: ?
  Administered 2022-02-06: 20:00:00 3 mL via INTRAMUSCULAR

## 2022-02-06 NOTE — Progress Notes

## 2022-02-06 NOTE — Patient Instructions
Discharge Instructions for Medial Branch Block    Important information following your procedure today: You may drive today    This injection is for diagnostic purposes, it is a test. Only short-term results are expected.    Though the procedure is generally safe and complications are rare, we do ask that you be aware of any of the following:   Any swelling, persistent redness, new bleeding, or drainage from the site of the injection.  You should not experience a severe headache.  You should not run a fever over 101 F.  New onset of sharp, severe back & or neck pain.  New onset of upper or lower extremity numbness or weakness.  New difficulty controlling bowel or bladder function after the injection.  New shortness of breath.    If any of these occur, please call to report this occurrence to Dr. Smith at [phone]. If you are calling after 4:00 p.m. or on weekends or holidays please call [phone] and ask to have the resident physician on call for the physician paged or go to your local emergency room.    Avoid application of direct heat, hot showers or hot tubs today.    Remain active today. Do the activities that would normally cause you pain. After the injection, you should get at least 80% relief for up to 2-4 hours.    Call back to report the results to a nurse tomorrow at Dr. Smith at [phone]. You may have to leave a message. Give your name and contact information and answer the following questions.  Did you get relief?  Percentage of relief?  How long did it last?    If you are a candidate, a scheduler will call you within the next week to schedule your next procedure. The nurse will call you back if your results were not as expected to discuss next steps in your treatment plan.      The following medications were used: Bupivicaine        If you are unable to keep your upcoming appointment, please notify the [facility] scheduler at [phone] at least 24 hours in advance.

## 2022-02-06 NOTE — Progress Notes
Patient noted to be drowsy post procedure, frequently dozed off, reported feeling '' light-headed" and ''just not right". Patient caregiver questioned patient if Klonopin had been taken prior to arrival, listed on current medication list. Patient reported taking two Klonopin. Caregiver reported patient had taken four Klonopin, based on amount of medication left in medication bottle. Patient oriented x 4, placed in wheelchair, desk telephone number given to patient caregiver for transportation to private vehicle.

## 2022-02-06 NOTE — Progress Notes
SPINE [facility]ENTER  INTERVENTIONAL PAIN PROCEDURE HISTORY AND PHYSICAL    Chief Complaint: Pain    HISTORY OF PRESENT ILLNESS:    Patient returns today for interventional treatment of axial back pain. Patient denies any recent fevers, chills, infection, antibiotics, coagulopathy or contra-indicated anticoagulants. Risks of the procedure were discussed including but not limited to bleeding, infection, damage to surrounding structures and reaction to medications. Patient reports understanding and has elected to proceed with the procedure.        Medial Branch Block Checklist:    Moderate to severe chronic low back pain, predominately axial, that causes functional deficit measured on pain or disability scale.  Yes  Pain present for minimum of 3 months with documented failure to respond to noninvasive conservative management such as passage of time, rest, medication management     Absence of untreated radiculopathy  Yes *[x]   No []   Abscense of untreated neurogenic claudication (except for radiculopathy caused by facet joint synovial cyst)                        Yes *[x]   No []               There is no non-facet pathology per clinical assessment or radiology studies that could explain the source of the patient 's pain, including but not limited to fracture, tumor, infection, or significant deformity.             Previous Medial Branch Block injection was greater than 2 weeks      NA  Previous radiculopathy (if present) treated by ESI injection Yes *[x]   No []      Has this level been previously ablated greater than 2 years (if prior ablation was performed) NA      This level has never had an Anterior Lumbar Interbody Fusion      Yes *[x]   No []           Oswestry Total Score:: (P) 82           Medical History:   Diagnosis Date   ? Anxiety and depression    ? Carpal tunnel syndrome    ? Cervical myelopathy (HCC)    ? Cervical stenosis of spine    ? Chest pain    ? Constipation    ? Essential hypertension    ? Fibromyalgia    ? GERD (gastroesophageal reflux disease)    ? Herniated disc, cervical    ? History of abnormal electrocardiogram 01/2016   ? History of colon polyps 2015   ? History of MRSA infection 2019    right hand/[facility]   ? Hyperlipemia 08/09/2009   ? Pneumonia        Surgical History:   Procedure Laterality Date   ? HX HYSTERECTOMY  1985   ? UPPER GASTROINTESTINAL ENDOSCOPY  2015   ? COLONOSCOPY  2015    hx colon polyps x2   ? CARDIOVASCULAR STRESS TEST  01/2016   ? FINGER TRIGGER RELEASE Right 2019   ? CARPAL TUNNEL RELEASE Right 2019   ? ANTERIOR CERVICAL DECOMPRESSION AND FUSION AT CERVICAL 6-7 N/A 10/28/2018    Performed by Criss Rosales, MD at [facility]   ? ARTHRODESIS SPINE ANTERIOR INTERBODY WITH DISCECTOMY/ OSTEOPHYTECTOMY/ DECOMPRESSION - CERVICAL BELOW C2 - EACH ADDITIONAL INTERSPACE N/A 10/28/2018    Performed by Criss Rosales, MD at [facility] [place]   ? ANTERIOR INSTRUMENTATION - 2 TO 3 VERTEBRAL SEGMENTS N/A 10/28/2018  Performed by Criss Rosales, MD at [facility] [place]   ? DILATION AND CURETTAGE  1980's       family history includes Cancer in her sister and another family member; Coronary Artery Disease in her father, mother, and other family members; Premature Heart Disease in an other family member; Stroke in an other family member.    Social History     Socioeconomic History   ? Marital status: Single   Tobacco Use   ? Smoking status: Some Days     Packs/day: 0.50     Years: 40.00     Pack years: 20.00     Types: Cigarettes   ? Smokeless tobacco: Never   ? Tobacco comments:     variable 1-1/2 a day, usually 1 or 2 per day.   Substance and Sexual Activity   ? Alcohol use: Yes     Alcohol/week: 0.0 standard drinks of alcohol     Comment: social   ? Drug use: Yes     Types: Marijuana     Comment: rare for back pain       Allergies   Allergen Reactions   ? Ascorbic Acid UNKNOWN   ? Erythromycin RASH     Rash on trunk   ? Strawberry UNKNOWN           REVIEW OF SYSTEMS: 10 point ROS obtained and negative except as above.      PHYSICAL EXAM:  General: Alert, cooperative, no acute distress.  HEENT: Normocephalic, atraumatic.  Neck: Supple.  Lungs: Unlabored respirations, bilateral and equal chest excursion.  Heart: Regular rate.  Skin: Warm and dry to touch.  Abdomen: Nondistended.  MSK: No deformity  Neurological: Alert and oriented x3.        IMPRESSION:    1. Lumbar spondylosis         PLAN:   Bilateral L4-S1 MBB #1/2      Patient was seen, examined, and discussed with attending physician, Dr. Katrinka Blazing, who agrees with assessment and plan.    Aurelio Brash MD  Interventional Spine & [facility] Medicine Fellow  [facility]

## 2022-02-06 NOTE — Procedures
Attending Surgeon: [name], MD    Anesthesia: Local    Pre-Procedure Diagnosis:   1. Lumbar spondylosis        Post-Procedure Diagnosis:   1. Lumbar spondylosis        Pain Score: Eight    [place] [facility] SPINE INJECT MBB LUMB/SACRAL  Procedure: medial branch block    Laterality: bilateral    Location: - L3, L4 and L5      Consent:   Consent obtained: verbal and written  Consent given by: patient  Risks discussed: allergic reaction, bleeding, bruising, infection, nerve damage and reaction to medication       Universal Protocol:  Relevant documents: relevant documents present and verified  Test results: test results available and properly labeled  Imaging studies: imaging studies available  Required items: required blood products, implants, devices, and special equipment available  Site marked: the operative site was marked  Patient identity confirmed: Patient identify confirmed verbally with patient.        Time out: Immediately prior to procedure a time out was called to verify the correct patient, procedure, equipment, support staff and site/side marked as required      Procedures Details:   Indications: diagnostic evaluation   Prep: chlorhexidine  Number of Levels: 2  Guidance: fluoroscopy  Needle and Epidural Catheter: quincke  Needle size: 25 G  Patient tolerance: Patient tolerated the procedure well with no immediate complications. Pressure was applied, and hemostasis was accomplished.  Outcome: Pain relieved  Comments: DESCRIPTION OF PROCEDURE:  The procedure risks and benefits were explained, and informed consent was obtained from the patient.  The patient was placed in the prone position on the fluoroscopy table.  Blood pressure cuff and oxygen saturation monitors were attached and the patient was monitored throughout the entire procedure.  The L4 and L5 vertebral levels were identified with the use of fluoroscopy in AP view.  The skin was prepped using Chlorhexadine and draped in aseptic fashion.  The C-arm was obliqued to the right to visualize the junction of the transverse process and superior articular process of the L4 and L5 vertebrae.  The skin and subcutaneous tissue was anesthetized using 2 mL of 1 percent lidocaine with a 27-gauge needle.  A 3.5-inch 25-gauge spinal needle was advanced parallel to the x-ray beam towards the junction of the superior articular process and transverse process to the anatomic position of the L3 and L4 medial branches.  An equivalent needle was then advanced parallel towards the anatomic position of the L5 primary dorsal ramus at the sacral ala.  The needle tips were advanced slowly until the tip of the needle made contact with bone.  After negative aspiration, 0.5 mL of local anesthetic as noted below was injected at each level.  The needles were then removed.    Attention was taken to the left side where the injection was performed in similar manner.    There were no complications.  The patient tolerated the procedure well and was brought to the recovery room for observation in stable condition.     Plan of care:  If patient has a positive response, the patient is to follow up for second medial branch block.  They were advised to contact the [facility] for any of the following:  Fever, chills, or night sweats.  New onset of severe sharp pain.  Any new upper or lower extremity weakness or numbness.  Any questions regarding the procedure.     If the patient  is unable contact [facility] Sp[facility], the patient was told to go to the local emergency room.               Administrations This Visit     bupivacaine PF (MARCAINE) 0.5 % injection 3 mL     Admin Date  02/06/2022 Action  Given Dose  3 mL Route  Injection Administered By  [name], MD              Estimated blood loss: none or minimal  Specimens: none  Patient tolerated the procedure well with no immediate complications. Pressure was applied, and hemostasis was accomplished.

## 2022-03-01 NOTE — Patient Instructions
It was a pleasure seeing you in clinic today.    Camey Edell RN, CNC  Dr. Joshua T. Bunch/Ortho Spine Surgery  [facility]  [facility]  [address]  [place], [place] [address]  Phone: [phone]   Fax [phone]  Scheduling [phone]  [email or web address]    We appreciate the interest in your health.  The physician reviews both the radiology reports and the imaging independently and will contact you if there are any emergent findings.  If you would like, incidental and chronic changes seen in your images can be reviewed with Dr. Bunch at a follow up appointment.  At that appointment, the radiology report and terminology used in that report can also be explained. Please call our Schedule line if you would like to follow up.

## 2022-03-02 ENCOUNTER — Ambulatory Visit: Admit: 2022-03-02 | Discharge: 2022-03-02 | Payer: MEDICARE

## 2022-03-02 ENCOUNTER — Encounter: Admit: 2022-03-02 | Discharge: 2022-03-02 | Payer: MEDICARE

## 2022-03-02 DIAGNOSIS — Z981 Arthrodesis status: Secondary | ICD-10-CM

## 2022-03-02 DIAGNOSIS — T148XXA Other injury of unspecified body region, initial encounter: Secondary | ICD-10-CM

## 2022-03-02 DIAGNOSIS — M4802 Spinal stenosis, cervical region: Secondary | ICD-10-CM

## 2022-03-02 DIAGNOSIS — M5416 Radiculopathy, lumbar region: Secondary | ICD-10-CM

## 2022-03-02 DIAGNOSIS — Z8614 Personal history of Methicillin resistant Staphylococcus aureus infection: Secondary | ICD-10-CM

## 2022-03-02 DIAGNOSIS — G959 Disease of spinal cord, unspecified: Secondary | ICD-10-CM

## 2022-03-02 DIAGNOSIS — G5600 Carpal tunnel syndrome, unspecified upper limb: Secondary | ICD-10-CM

## 2022-03-02 DIAGNOSIS — J45909 Unspecified asthma, uncomplicated: Secondary | ICD-10-CM

## 2022-03-02 DIAGNOSIS — M4316 Spondylolisthesis, lumbar region: Secondary | ICD-10-CM

## 2022-03-02 DIAGNOSIS — J189 Pneumonia, unspecified organism: Secondary | ICD-10-CM

## 2022-03-02 DIAGNOSIS — W19XXXA Unspecified fall, initial encounter: Secondary | ICD-10-CM

## 2022-03-02 DIAGNOSIS — Z8679 Personal history of other diseases of the circulatory system: Secondary | ICD-10-CM

## 2022-03-02 DIAGNOSIS — I8390 Asymptomatic varicose veins of unspecified lower extremity: Secondary | ICD-10-CM

## 2022-03-02 DIAGNOSIS — M4800 Spinal stenosis, site unspecified: Secondary | ICD-10-CM

## 2022-03-02 DIAGNOSIS — Z720 Tobacco use: Secondary | ICD-10-CM

## 2022-03-02 DIAGNOSIS — M2550 Pain in unspecified joint: Secondary | ICD-10-CM

## 2022-03-02 DIAGNOSIS — E785 Hyperlipidemia, unspecified: Secondary | ICD-10-CM

## 2022-03-02 DIAGNOSIS — F419 Anxiety disorder, unspecified: Secondary | ICD-10-CM

## 2022-03-02 DIAGNOSIS — J439 Emphysema, unspecified: Secondary | ICD-10-CM

## 2022-03-02 DIAGNOSIS — K5900 Constipation, unspecified: Secondary | ICD-10-CM

## 2022-03-02 DIAGNOSIS — M5020 Other cervical disc displacement, unspecified cervical region: Secondary | ICD-10-CM

## 2022-03-02 DIAGNOSIS — I10 Essential (primary) hypertension: Secondary | ICD-10-CM

## 2022-03-02 DIAGNOSIS — K219 Gastro-esophageal reflux disease without esophagitis: Secondary | ICD-10-CM

## 2022-03-02 DIAGNOSIS — Z86010 Personal history of colonic polyps: Secondary | ICD-10-CM

## 2022-03-02 DIAGNOSIS — M797 Fibromyalgia: Secondary | ICD-10-CM

## 2022-03-02 DIAGNOSIS — R519 Generalized headaches: Secondary | ICD-10-CM

## 2022-03-02 NOTE — Progress Notes
[facility] CLINIC NOTE    Subjective     SUBJECTIVE   Kristen Vaughn is a 61 y.o. female who returns today for repeat evaluation.  She states she has been dropping things again.  The left hand is worse than the right.  She is right-hand dominant.  This is worse since a fall three weeks ago.  A step gave way on her and she fell backwards onto the steps.  She has numbness in the long, ring, and small fingers of the left hand.  She has neck pain.  If she turns her neck just right it shoots pain up the side of her head and feels like the top of her skull is going to blow out.  It is disorienting and scary for her.  Sitting here today, she says as long as she does not turn her head too far to the left she is okay.  Her biggest complaint is her hands.  She has persistent balance issues, which she has had for years.  She has been using a walker for approximately one year now.  She uses this when out and about.  In the home she uses a cane.  She is still smoking.  She is smoking 3-10 cigarettes daily .         REVIEW OF SYSTEMS   Review of Systems   Musculoskeletal: Positive for back pain and neck pain.   Neurological: Positive for weakness.   All other systems reviewed and are negative.      ALLERGIES     Allergies   Allergen Reactions   ? Erythromycin RASH     Rash on trunk   ? Ascorbic Acid NAUSEA ONLY and UNKNOWN   ? Strawberry UNKNOWN       MEDICATIONS     Current Outpatient Medications on File Prior to Visit   Medication Sig Dispense Refill   ? acetaminophen (TYLENOL) 500 mg tablet Take two tablets by mouth every 6 hours as needed for Pain. Max of 4,000 mg of acetaminophen in 24 hours. 500 tablet 1   ? albuterol sulfate (PROAIR HFA) 90 mcg/actuation HFA aerosol inhaler Inhale two puffs by mouth into the lungs every 6 hours as needed for Wheezing or Shortness of Breath. Shake well before use.     ? AMITR/GABAPEN/EMU OIL 12/26/08% CREAM (COMPOUND) Apply 0.5grams to affected area three times daily PRN for pain 50 g 3 ? aspirin EC 81 mg tablet Take one tablet by mouth daily. Take with food.     ? baclofen (LIORESAL) 10 mg tablet Take one tablet by mouth three times daily.     ? busPIRone (BUSPAR) 5 mg tablet Take two tablets by mouth daily.     ? calcium carbonate (CALCIUM 600 PO) Take 1 tablet by mouth daily.     ? celecoxib (CELEBREX) 200 mg capsule Take one capsule by mouth daily.     ? cetirizine (ZYRTEC) 10 mg tablet Take one tablet by mouth daily as needed for Allergy symptoms.     ? Cholecalciferol (Vitamin D3) (VITAMIN D-3) 2,000 unit cap Take two capsules by mouth daily. (Patient taking differently: Take one capsule by mouth daily.) 300 capsule 11   ? clonazePAM (KLONOPIN) 1 mg tablet Take one tablet by mouth daily as needed.     ? denosumab (PROLIA) 60 mg/mL injection Inject 1 mL under the skin every 180 days.     ? desvenlafaxine succinate (PRISTIQ) 100 mg tablet Take one tablet by mouth daily.     ?  diclofenac (VOLTAREN) 1 % topical gel Apply four g topically to affected area four times daily.     ? fish oil- omega 3-DHA/EPA 300/1,000 mg capsule Take one capsule by mouth daily.     ? ketoconazole (NIZORAL) 2 % topical cream Apply  topically to affected area twice daily. 60 g 11   ? lidocaine (LIDODERM) 5 % topical patch Apply one patch topically to affected area daily. Apply patch for 12 hours, then remove for 12 hours before repeating. 90 patch 3   ? lidocaine/prilocaine (EMLA) 2.5/2.5 % topical cream APPLY TOPICALLY TO AFFECTED AREA AS NEEDED. 30 g 11   ? lisinopril (PRINIVIL; ZESTRIL) 10 mg tablet Take one tablet by mouth daily.     ? mirtazapine (REMERON) 15 mg tablet Take one tablet by mouth at bedtime daily.     ? mupirocin (BACTROBAN) 2 % topical ointment Apply  topically to affected area twice daily. 22 g 11   ? omeprazole DR(+) (PRILOSEC) 40 mg capsule Take one capsule by mouth daily before breakfast.     ? pregabalin (LYRICA) 200 mg capsule TAKE 1 CAPSULE BY MOUTH THREE TIMES A DAY 90 capsule 2   ? REXULTI 2 mg tablet Take one tablet by mouth daily.     ? rosuvastatin (CRESTOR) 10 mg tablet TAKE ONE TABLET BY MOUTH DAILY. 90 tablet 3   ? triamcinolone acetonide (KENALOG) 0.1 % topical ointment APPLY TO AFFECTED AREA TWICE A DAY 30 g 3   ? walker (ULTRA-LIGHT ROLLATOR) medical supply Use as directed. 1 each 0     Current Facility-Administered Medications on File Prior to Visit   Medication Dose Route Frequency Provider Last Rate Last Admin   ? diazePAM (VALIUM) tablet 5 mg  5 mg Oral PRN [name], MD   5 mg at 04/27/19 1429       PHYSICAL EXAM     Vitals:    03/02/22 0925   BP: 129/79   BP Source: Arm, Right Upper   Pulse: 79   SpO2: 99%   PainSc: Seven   Weight: 107 kg (236 lb)   Height: 170.2 cm (5' 7)     Oswestry Total Score:: 70  Pain Score: Seven  Body mass index is 36.96 kg/m?Marland Kitchen    Constitutional: Alert, NAD  Head: Atraumatic  Eyes: EOMI  Respiratory: Unlabored breathing  Cardiovascular: Regular rate  Skin: No rashes or open wounds appreciated on back  Musculoskeletal: Strength stable  Neurologic: Sensation stable  Negative hoffman  Able to walk with walker in room      RADIOLOGY     Imaging reviewed today in comparison to previous films from December 7 of 2022 show no changes of hardware alignment.  Subtle spondylolisthesis at L4-5.  No fractures or other osseous deformities.         ASSESSMENT / PLAN   Kristen Vaughn is a 61 y.o. female with:    1. S/P cervical spinal fusion  MRI C-SPINE WO/W CONTRAST    CT SPINE CERVICAL WO CONTRAST      2. Cervical myelopathy (HCC)  MRI C-SPINE WO/W CONTRAST    CT SPINE CERVICAL WO CONTRAST      3. Lumbar radiculopathy  MRI C-SPINE WO/W CONTRAST    CT SPINE CERVICAL WO CONTRAST      4. Spondylolisthesis, lumbar region  MRI C-SPINE WO/W CONTRAST    CT SPINE CERVICAL WO CONTRAST      5. Cervical stenosis of spine  MRI C-SPINE WO/W  CONTRAST    CT SPINE CERVICAL WO CONTRAST      6. Tobacco abuse  MRI C-SPINE WO/W CONTRAST    CT SPINE CERVICAL WO CONTRAST Patient's reports progressively worsening symptoms after a fall three weeks ago.  I think this warrants further workup with a CT scan of her cervical spine as well as an MRI of her cervical spine with and without contrast.  These diagnostic studies will allow me to make accurate and meaningful decisions for future treatment recommendations and to help evaluate her previous fusion.  We will follow up with her after these studies are completed to review and make further recommendations.  I have encouraged her to work on smoking cessation.  Again, she would need to be nicotine free before considering undergoing any further operations on her neck or back.  Patient verbalized understanding and is in agreement with this plan.  Instructed to contact the office for any worsening or concerning symptoms           In the presence of Dickie La, PA-C, I have taken down these notes, [name], Scribe. March 02, 2022 9:45 AM     Coralie Common, PA-C   Physician Assistant to Laneta Simmers, MD  Spinal Surgery  [facility] [place]. Clydene Pugh MD, [facility]  Nurse: Jen Mow, RN   [phone]  -  [email or web address]

## 2022-03-13 ENCOUNTER — Encounter: Admit: 2022-03-13 | Discharge: 2022-03-13 | Payer: MEDICARE

## 2022-03-15 ENCOUNTER — Encounter: Admit: 2022-03-15 | Discharge: 2022-03-15 | Payer: MEDICARE

## 2022-03-15 DIAGNOSIS — Z86010 Personal history of colonic polyps: Secondary | ICD-10-CM

## 2022-03-15 DIAGNOSIS — I10 Essential (primary) hypertension: Secondary | ICD-10-CM

## 2022-03-15 DIAGNOSIS — G959 Disease of spinal cord, unspecified: Secondary | ICD-10-CM

## 2022-03-15 DIAGNOSIS — T148XXA Other injury of unspecified body region, initial encounter: Secondary | ICD-10-CM

## 2022-03-15 DIAGNOSIS — J189 Pneumonia, unspecified organism: Secondary | ICD-10-CM

## 2022-03-15 DIAGNOSIS — E785 Hyperlipidemia, unspecified: Secondary | ICD-10-CM

## 2022-03-15 DIAGNOSIS — R519 Generalized headaches: Secondary | ICD-10-CM

## 2022-03-15 DIAGNOSIS — M797 Fibromyalgia: Secondary | ICD-10-CM

## 2022-03-15 DIAGNOSIS — M4802 Spinal stenosis, cervical region: Secondary | ICD-10-CM

## 2022-03-15 DIAGNOSIS — Z720 Tobacco use: Secondary | ICD-10-CM

## 2022-03-15 DIAGNOSIS — K219 Gastro-esophageal reflux disease without esophagitis: Secondary | ICD-10-CM

## 2022-03-15 DIAGNOSIS — M5020 Other cervical disc displacement, unspecified cervical region: Secondary | ICD-10-CM

## 2022-03-15 DIAGNOSIS — Z8679 Personal history of other diseases of the circulatory system: Secondary | ICD-10-CM

## 2022-03-15 DIAGNOSIS — G5600 Carpal tunnel syndrome, unspecified upper limb: Secondary | ICD-10-CM

## 2022-03-15 DIAGNOSIS — J439 Emphysema, unspecified: Secondary | ICD-10-CM

## 2022-03-15 DIAGNOSIS — M4800 Spinal stenosis, site unspecified: Secondary | ICD-10-CM

## 2022-03-15 DIAGNOSIS — Z136 Encounter for screening for cardiovascular disorders: Secondary | ICD-10-CM

## 2022-03-15 DIAGNOSIS — Z981 Arthrodesis status: Secondary | ICD-10-CM

## 2022-03-15 DIAGNOSIS — Z8614 Personal history of Methicillin resistant Staphylococcus aureus infection: Secondary | ICD-10-CM

## 2022-03-15 DIAGNOSIS — M79605 Pain in left leg: Secondary | ICD-10-CM

## 2022-03-15 DIAGNOSIS — G5732 Lesion of lateral popliteal nerve, left lower limb: Secondary | ICD-10-CM

## 2022-03-15 DIAGNOSIS — J45909 Unspecified asthma, uncomplicated: Secondary | ICD-10-CM

## 2022-03-15 DIAGNOSIS — E782 Mixed hyperlipidemia: Secondary | ICD-10-CM

## 2022-03-15 DIAGNOSIS — Z9989 Dependence on other enabling machines and devices: Secondary | ICD-10-CM

## 2022-03-15 DIAGNOSIS — E6609 Other obesity due to excess calories: Secondary | ICD-10-CM

## 2022-03-15 DIAGNOSIS — M545 Low back pain, unspecified back pain laterality, unspecified chronicity, unspecified whether sciatica present: Secondary | ICD-10-CM

## 2022-03-15 DIAGNOSIS — K5900 Constipation, unspecified: Secondary | ICD-10-CM

## 2022-03-15 DIAGNOSIS — Z8249 Family history of ischemic heart disease and other diseases of the circulatory system: Secondary | ICD-10-CM

## 2022-03-15 DIAGNOSIS — I8390 Asymptomatic varicose veins of unspecified lower extremity: Secondary | ICD-10-CM

## 2022-03-15 DIAGNOSIS — F419 Anxiety disorder, unspecified: Secondary | ICD-10-CM

## 2022-03-15 DIAGNOSIS — R072 Precordial pain: Secondary | ICD-10-CM

## 2022-03-15 DIAGNOSIS — M2550 Pain in unspecified joint: Secondary | ICD-10-CM

## 2022-03-15 MED ORDER — LOSARTAN 25 MG PO TAB
25 mg | ORAL_TABLET | Freq: Every day | ORAL | 3 refills | 90.00000 days | Status: AC
Start: 2022-03-15 — End: ?

## 2022-03-15 NOTE — Progress Notes
Date of Service: 03/15/2022    Kristen Vaughn is a 61 y.o. female.       HPI     Kristen Vaughn is a 61 y.o. 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    She reports having episodes of chest pain the statin her back, radiate upward the thoracic spine, involved both shoulders, radiated to the neck and the joints.  These episodes occurred with and without physical activity and they typically last few minutes.  Due to longstanding history and severe spine issues patient cannot perform a lot of physical activity, she does use a cane for ambulation.    She was evaluated with 2 stress tests in May and October 2022 they were both unremarkable for ischemia.         Vitals:    03/15/22 1534   BP: 118/72   BP Source: Arm, Left Upper   Pulse: 67   SpO2: 95%   O2 Device: None (Room air)   PainSc: Zero   Weight: 106.8 kg (235 lb 6.4 oz)   Height: 170.2 cm (5' 7)     Body mass index is 36.87 kg/m?Kristen Vaughn     Past Medical History  Patient Active Problem List    Diagnosis Date Noted   ? Peripheral polyneuropathy 05/17/2021   ? Class 2 obesity in adult 05/26/2019   ? Family history of coronary artery disease 05/26/2019   ? Tobacco abuse 05/26/2019   ? Ambulates with cane 05/26/2019   ? Atypical chest pain 05/26/2019   ? S/P cervical spinal fusion 02/04/2019   ? Cervical stenosis of spine 10/28/2018   ? Tobacco abuse 10/24/2018     Smoked 1/2 to 1 pack cigarettes daily for 40 years.  Stopped (almost) late October 2019     ? Preoperative cardiovascular examination 10/24/2018   ? Osteoporosis without current pathological fracture 10/15/2017   ? 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 Stenosis of cervical spine 04/17/2017   ? Cervical myelopathy (HCC) 04/17/2017   ? Cervical radiculopathy 04/17/2017   ? 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 Pain of left lower extremity 11/06/2016   ? Hyperlipemia 08/09/2009   ? Chest pain 08/09/2009     05/17 regadenoson sestamibi MPI: EF 74%, no ischemia.           Review of Systems   Constitutional: Negative.   HENT: Negative.    Eyes: Negative.    Cardiovascular: Negative.    Respiratory: Negative.    Endocrine: Negative.    Hematologic/Lymphatic: Negative.    Skin: Negative.    Musculoskeletal: Negative.    Gastrointestinal: Negative.    Genitourinary: Negative.    Neurological: Negative.    Psychiatric/Behavioral: Negative.    Allergic/Immunologic: Negative.        Physical Exam  General Appearance: Patient is pale, she appears chronically ill, BMI 36.87 kg/m?, she uses a cane for ambulation  Skin: warm, moist, no ulcers or xanthomas  Eyes: conjunctivae and lids normal, pupils are equal and round  Lips & Oral Mucosa: no pallor or cyanosis  Neck Veins: neck veins are flat, neck veins are not distended  Chest Inspection: chest is normal in appearance  Respiratory Effort: breathing comfortably, no respiratory distress  Auscultation/Percussion: lungs clear to auscultation, no rales or  rhonchi, no wheezing  Cardiac Rhythm: regular rhythm and normal rate  Cardiac Auscultation: S1, S2 normal, no rub, no gallop  Murmurs: no murmur  Carotid Arteries: normal carotid upstroke bilaterally, no bruit  Lower Extremity Edema: no lower extremity edema  Abdominal Exam: soft, non-tender, no masses, bowel sounds normal  Liver & Spleen: no organomegaly  Language and Memory: patient responsive and seems to comprehend information  Neurologic Exam: neurological assessment grossly intact      Cardiovascular Studies  Twelve-lead EKG demonstrates normal sinus rhythm, incomplete RBBB, no axis deviation, ventricular rate 66 bpm    Cardiovascular Health Factors  Vitals BP Readings from Last 3 Encounters:   03/15/22 118/72   03/02/22 129/79   02/06/22 131/89     Wt Readings from Last 3 Encounters:   03/15/22 106.8 kg (235 lb 6.4 oz)   03/02/22 107 kg (236 lb)   02/06/22 103.4 kg (228 lb)     BMI Readings from Last 3 Encounters:   03/15/22 36.87 kg/m?   03/02/22 36.96 kg/m?   02/06/22 35.71 kg/m?      Smoking Social History     Tobacco Use   Smoking Status Some Days   ? Packs/day: 0.50   ? Years: 40.00   ? Pack years: 20.00   ? Types: Cigarettes   Smokeless Tobacco Never   Tobacco Comments    variable 1-1/2 a day, usually 1 or 2 per day.      Lipid Profile Cholesterol   Date Value Ref Range Status   07/06/2021 145  Final     HDL   Date Value Ref Range Status   07/06/2021 48  Final     LDL   Date Value Ref Range Status   07/06/2021 66  Final     Triglycerides   Date Value Ref Range Status   07/06/2021 155 (H) <150 Final      Blood Sugar Hemoglobin A1C   Date Value Ref Range Status   10/06/2018 5.8 4.0 - 6.0 % Final     Comment:     The ADA recommends that most patients with type 1 and type 2 diabetes maintain   an A1c level <7%.       Glucose   Date Value Ref Range Status   07/06/2021 95  Final   06/08/2020 103  Final   12/03/2019 94  Final          Problems Addressed Today  Encounter Diagnoses   Name Primary?   ? Mixed hyperlipidemia Yes   ? Precordial pain    ? Neuropathy of left peroneal nerve    ? Pain of left lower extremity    ? Tobacco abuse    ? S/P cervical spinal fusion    ? Class 2 obesity due to excess calories without serious comorbidity with body mass index (BMI) of 37.0 to 37.9 in adult    ? Family history of coronary artery disease    ? Ambulates with cane    ? Cervical disc herniation    ? Stenosis of cervical spine    ? Cervical myelopathy (HCC)    ? Screening for heart disease    ? Low back pain, unspecified back pain laterality, unspecified chronicity, unspecified whether sciatica present        Assessment and Plan     Assessment:    1.  Chest pain  ? Patient presents with mixed typical and atypical features  ? Pain starts in the posterior thorax at  the level of thoracic spine radiating upwards involving both shoulders, base of the neck and jaw  She was evaluated with 2 regadenoson thallium stress test in 2022-there were unremarkable for ischemia  2.  Primary hypertension- well-controlled  3.  Possibly ACE inhibitor induced cough  4.  Hyperlipidemia- on statin therapy  5.  Family history of premature CAD  6.  Continued tobacco use  7.  Overall poor functional status very likely mainly due to to complex spine issues, status post previous surgical intervention, myelopathy    Plan:    1.  Discontinue lisinopril  2.  Start losartan 25 mg-I asked her to take half tablet p.o. daily  3.  Further evaluation with a left heart catheterization in the light of continued chest pain and risk factors for CAD  4.  Referral to Dr. Jolayne Haines in the spine surgery department for further evaluation    Total Time Today was 40 minutes in the following activities: Preparing to see the patient, Obtaining and/or reviewing separately obtained history, Performing a medically appropriate examination and/or evaluation, Counseling and educating the patient/family/caregiver, Ordering medications, tests, or procedures, Referring and communication with other health care professionals (when not separately reported), Documenting clinical information in the electronic or other health record, Independently interpreting results (not separately reported) and communicating results to the patient/family/caregiver and Care coordination (not separately reported)         Current Medications (including today's revisions)  ? acetaminophen (TYLENOL) 500 mg tablet Take two tablets by mouth every 6 hours as needed for Pain. Max of 4,000 mg of acetaminophen in 24 hours.   ? albuterol sulfate (PROAIR HFA) 90 mcg/actuation HFA aerosol inhaler Inhale two puffs by mouth into the lungs every 6 hours as needed for Wheezing or Shortness of Breath. Shake well before use.   ? AMITR/GABAPEN/EMU OIL 12/26/08% CREAM (COMPOUND) Apply 0.5grams to affected area three times daily PRN for pain   ? aspirin EC 81 mg tablet Take one tablet by mouth daily. Take with food.   ? baclofen (LIORESAL) 10 mg tablet Take one tablet by mouth three times daily.   ? busPIRone (BUSPAR) 5 mg tablet Take two tablets by mouth daily.   ? calcium carbonate (CALCIUM 600 PO) Take 1 tablet by mouth daily.   ? celecoxib (CELEBREX) 200 mg capsule Take one capsule by mouth daily.   ? cetirizine (ZYRTEC) 10 mg tablet Take one tablet by mouth daily as needed for Allergy symptoms.   ? Cholecalciferol (Vitamin D3) (VITAMIN D-3) 2,000 unit cap Take two capsules by mouth daily. (Patient taking differently: Take one capsule by mouth daily.)   ? clonazePAM (KLONOPIN) 1 mg tablet Take one tablet by mouth daily as needed.   ? denosumab (PROLIA) 60 mg/mL injection Inject 1 mL under the skin every 180 days.   ? desvenlafaxine succinate (PRISTIQ) 100 mg tablet Take one tablet by mouth daily.   ? diclofenac (VOLTAREN) 1 % topical gel Apply four g topically to affected area four times daily.   ? fish oil- omega 3-DHA/EPA 300/1,000 mg capsule Take one capsule by mouth daily.   ? ketoconazole (NIZORAL) 2 % topical cream Apply  topically to affected area twice daily.   ? lidocaine (LIDODERM) 5 % topical patch Apply one patch topically to affected area daily. Apply patch for 12 hours, then remove for 12 hours before repeating.   ? lidocaine/prilocaine (EMLA) 2.5/2.5 % topical cream APPLY TOPICALLY TO AFFECTED AREA AS NEEDED.   ? lisinopril (PRINIVIL; ZESTRIL)  10 mg tablet Take one tablet by mouth daily.   ? mirtazapine (REMERON) 15 mg tablet Take one tablet by mouth at bedtime daily.   ? mupirocin (BACTROBAN) 2 % topical ointment Apply  topically to affected area twice daily.   ? omeprazole DR(+) (PRILOSEC) 40 mg capsule Take one capsule by mouth daily before breakfast.   ? pregabalin (LYRICA) 200 mg capsule TAKE 1 CAPSULE BY MOUTH THREE TIMES A DAY   ? REXULTI 2 mg tablet Take one tablet by mouth daily.   ? rosuvastatin (CRESTOR) 10 mg tablet TAKE ONE TABLET BY MOUTH DAILY.   ? triamcinolone acetonide (KENALOG) 0.1 % topical ointment APPLY TO AFFECTED AREA TWICE A DAY   ? walker (ULTRA-LIGHT ROLLATOR) medical supply Use as directed.

## 2022-03-16 ENCOUNTER — Encounter: Admit: 2022-03-16 | Discharge: 2022-03-16 | Payer: MEDICARE

## 2022-03-16 DIAGNOSIS — R079 Chest pain, unspecified: Secondary | ICD-10-CM

## 2022-03-16 DIAGNOSIS — R072 Precordial pain: Secondary | ICD-10-CM

## 2022-03-16 DIAGNOSIS — I209 Angina pectoris, unspecified: Secondary | ICD-10-CM

## 2022-03-16 DIAGNOSIS — R0789 Other chest pain: Secondary | ICD-10-CM

## 2022-03-16 DIAGNOSIS — Z01812 Encounter for preprocedural laboratory examination: Secondary | ICD-10-CM

## 2022-03-16 MED ORDER — ASPIRIN 325 MG PO TAB
325 mg | Freq: Once | ORAL | 0 refills
Start: 2022-03-16 — End: ?

## 2022-03-16 MED ORDER — NITROGLYCERIN 0.3 MG SL SUBL
.4 mg | SUBLINGUAL | 0 refills | PRN
Start: 2022-03-16 — End: ?

## 2022-03-16 MED ORDER — LOSARTAN 25 MG PO TAB
25 mg | ORAL_TABLET | Freq: Every day | ORAL | 1 refills
Start: 2022-03-16 — End: ?

## 2022-03-16 NOTE — Patient Instructions
CARDIAC CATHETERIZATION   PRE-ADMISSION INSTRUCTIONS    Patient Name: Kristen Vaughn  MRN#: [id number]  Date of Birth: 20-Jul-1961 (61 y.o.)  Today's Date: 03/16/2022    PROCEDURE:  You are scheduled for a Left Diagnostic Coronary Angiogram with Dr. Greig Castilla.    PROCEDURE DATE AND ARRIVAL TIME:  Your procedure date is 03/30/22.  You will receive a call from the Cath lab staff between 8:00 a.m. and noon on the business day prior to your procedure to let you know at what time to arrive on the day of your procedure.    Please check in at the Admitting Desk in the [facility] for your procedure. [facility] Entrance and and take a right. Continue down the hallway past the Cardiovascular Medicine office. That hall will take [facility] into [facility]. Check in at the desk on the left side.)     (If you have further questions regarding your arrival time for the CV lab, please call [phone] by 3:00pm the day before your procedure. Please leave a message with your name and number, your call will be returned in a timely manner.)    PRE-PROCEDURE APPOINTMENTS:           Pre-Admission lab work required within 14 days of procedure: BMP and CBC at the lab of your choice.         FOOD AND DRINK INSTRUCTIONS  Nothing to eat after midnight before your procedure. No caffeine for 24 hours prior to your procedure. You will be under general anesthesia for this procedure.  DO NOT eat or drink anything after midnight the night before your procedure.     SPECIAL MEDICATION INSTRUCTIONS  Any new prescriptions will be sent to your pharmacy listed on file with [place].        Please either take 4 baby aspirins (4 times 81mg ) or one full strength NON-COATED 325mg  aspirin.       HOLD ALL erectile dysfunction medications for 3 days, unless prescribed for pulmonary hypertension.  HOLD ALL over the counter vitamins or supplements on the morning of your procedure.      Additional Instructions  If you wear CPAP, please bring your mask and machine with you to the hospital.    Take a bath or shower with anti-bacterial soap the evening before, or the morning of the procedure.     Bring photo ID and your health insurance card(s).    Arrange for a driver to take you home from the hospital. Please arrange for a friend or family member to take you home from this test. You cannot take a Taxi, [employer], or public transportation as there has to be a responsible person to help care for you after sedation    Bring an accurate list of your current medications with you to the hospital (all medications and supplements taken daily).  Please use the medication list below and write in the date and time when you took your last dose before your procedure. Update this list of medications as needed.      Wear comfortable clothes and don't bring valuables, other than photo identification card, with you to the hospital.    Please pack a bag for an overnight stay.     Please review your pre-procedure instructions and bring them with you on the day of your procedure.  Call the office at  [phone]  with any questions. You may ask to speak with Dr. Orvil Feil nurse.  ALLERGIES  Allergies   Allergen Reactions    Erythromycin RASH     Rash on trunk    Ascorbic Acid NAUSEA ONLY and UNKNOWN    Strawberry UNKNOWN       CURRENT MEDICATIONS  Outpatient Encounter Medications as of 03/16/2022   Medication Sig Dispense Refill    acetaminophen (TYLENOL) 500 mg tablet Take two tablets by mouth every 6 hours as needed for Pain. Max of 4,000 mg of acetaminophen in 24 hours. 500 tablet 1    albuterol sulfate (PROAIR HFA) 90 mcg/actuation HFA aerosol inhaler Inhale two puffs by mouth into the lungs every 6 hours as needed for Wheezing or Shortness of Breath. Shake well before use.      AMITR/GABAPEN/EMU OIL 12/26/08% CREAM (COMPOUND) Apply 0.5grams to affected area three times daily PRN for pain 50 g 3    aspirin EC 81 mg tablet Take one tablet by mouth daily. Take with food. baclofen (LIORESAL) 10 mg tablet Take one tablet by mouth three times daily.      busPIRone (BUSPAR) 5 mg tablet Take two tablets by mouth daily.      calcium carbonate (CALCIUM 600 PO) Take 1 tablet by mouth daily.      celecoxib (CELEBREX) 200 mg capsule Take one capsule by mouth daily.      cetirizine (ZYRTEC) 10 mg tablet Take one tablet by mouth daily as needed for Allergy symptoms.      Cholecalciferol (Vitamin D3) (VITAMIN D-3) 2,000 unit cap Take two capsules by mouth daily. (Patient taking differently: Take one capsule by mouth daily.) 300 capsule 11    clonazePAM (KLONOPIN) 1 mg tablet Take one tablet by mouth daily as needed.      denosumab (PROLIA) 60 mg/mL injection Inject 1 mL under the skin every 180 days.      desvenlafaxine succinate (PRISTIQ) 100 mg tablet Take one tablet by mouth daily.      diclofenac (VOLTAREN) 1 % topical gel Apply four g topically to affected area four times daily.      fish oil- omega 3-DHA/EPA 300/1,000 mg capsule Take one capsule by mouth daily.      ketoconazole (NIZORAL) 2 % topical cream Apply  topically to affected area twice daily. 60 g 11    lidocaine (LIDODERM) 5 % topical patch Apply one patch topically to affected area daily. Apply patch for 12 hours, then remove for 12 hours before repeating. 90 patch 3    lidocaine/prilocaine (EMLA) 2.5/2.5 % topical cream APPLY TOPICALLY TO AFFECTED AREA AS NEEDED. 30 g 11    losartan (COZAAR) 25 mg tablet Take one tablet by mouth daily. Indications: high blood pressure 30 tablet 3    mirtazapine (REMERON) 15 mg tablet Take one tablet by mouth at bedtime daily.      mupirocin (BACTROBAN) 2 % topical ointment Apply  topically to affected area twice daily. 22 g 11    omeprazole DR(+) (PRILOSEC) 40 mg capsule Take one capsule by mouth daily before breakfast.      pregabalin (LYRICA) 200 mg capsule TAKE 1 CAPSULE BY MOUTH THREE TIMES A DAY 90 capsule 2    REXULTI 2 mg tablet Take one tablet by mouth daily.      rosuvastatin (CRESTOR) 10 mg tablet TAKE ONE TABLET BY MOUTH DAILY. 90 tablet 3    triamcinolone acetonide (KENALOG) 0.1 % topical ointment APPLY TO AFFECTED AREA TWICE A DAY 30 g 3    walker (ULTRA-LIGHT ROLLATOR) medical supply Use as directed.  1 each 0     Facility-Administered Encounter Medications as of 03/16/2022   Medication Dose Route Frequency Provider Last Rate Last Admin    diazePAM (VALIUM) tablet 5 mg  5 mg Oral PRN [name], MD   5 mg at 04/27/19 1429       _________________________________________  Form completed by: Fulton Reek, RN  Date completed: 03/16/22  Method:  In person and mailed to patient

## 2022-03-19 ENCOUNTER — Encounter: Admit: 2022-03-19 | Discharge: 2022-03-19 | Payer: MEDICARE

## 2022-03-23 ENCOUNTER — Encounter: Admit: 2022-03-23 | Discharge: 2022-03-23 | Payer: MEDICARE

## 2022-03-23 DIAGNOSIS — Z01812 Encounter for preprocedural laboratory examination: Secondary | ICD-10-CM

## 2022-03-23 DIAGNOSIS — R072 Precordial pain: Secondary | ICD-10-CM

## 2022-03-23 DIAGNOSIS — R0789 Other chest pain: Secondary | ICD-10-CM

## 2022-03-23 LAB — BASIC METABOLIC PANEL
ANION GAP: 9
BLD UREA NITROGEN: 21 — ABNORMAL HIGH (ref 9.8–20.1)
CALCIUM: 9.4
CHLORIDE: 108 — ABNORMAL HIGH (ref 98–107)
CO2: 22 — ABNORMAL LOW (ref 23–31)
CREATININE: 0.8
GFR ESTIMATED: 68
GLUCOSE,PANEL: 89
POTASSIUM: 4.9

## 2022-03-23 LAB — CBC
HEMATOCRIT: 41
HEMOGLOBIN: 13
MCV: 94
RBC COUNT: 4.3
WBC COUNT: 5.4

## 2022-03-23 NOTE — Telephone Encounter
Called and attempted to provide clinical information for peer to peer, must be APRN, PA, or MD.  Discussed with Dr. Avie Arenas.  Dr. Avie Arenas reviewed pt's chart and states okay to cancel procedure.  Pt has has 2 stress tests that were normal.  [facility] believes symptoms are more related to back issue/pain.  Proceed with evaluation from spine center first.

## 2022-03-23 NOTE — Telephone Encounter
Left message with recommendations and cancellation of procedure.  Sent flag to cardiovascular lab scheduling to cancel scheduled LHC.  Left callback number for any questions or concerns.

## 2022-03-23 NOTE — Telephone Encounter
-----   Message from Daphene Calamity, RN sent at 03/23/2022  9:41 AM CDT -----    ----- Message -----  From: Margit Hanks  Sent: 03/23/2022   9:30 AM CDT  To: Cvm Nurse [place]     Need a P2P for CATH 22482 CASE#  [id number] please call [phone] op# 1  More instructions and reasons in attached letter. Also sent e-mail some [facility] nurses

## 2022-03-30 ENCOUNTER — Encounter: Admit: 2022-03-30 | Discharge: 2022-03-30 | Payer: MEDICARE

## 2022-04-04 ENCOUNTER — Encounter: Admit: 2022-04-04 | Discharge: 2022-04-04 | Payer: MEDICARE

## 2022-04-10 ENCOUNTER — Encounter: Admit: 2022-04-10 | Discharge: 2022-04-10 | Payer: MEDICARE

## 2022-04-13 ENCOUNTER — Ambulatory Visit: Admit: 2022-04-13 | Discharge: 2022-04-13 | Payer: MEDICARE

## 2022-04-13 ENCOUNTER — Encounter: Admit: 2022-04-13 | Discharge: 2022-04-13 | Payer: MEDICARE

## 2022-04-13 DIAGNOSIS — M4316 Spondylolisthesis, lumbar region: Secondary | ICD-10-CM

## 2022-04-13 DIAGNOSIS — Z720 Tobacco use: Secondary | ICD-10-CM

## 2022-04-13 DIAGNOSIS — M5416 Radiculopathy, lumbar region: Secondary | ICD-10-CM

## 2022-04-13 DIAGNOSIS — G959 Disease of spinal cord, unspecified: Secondary | ICD-10-CM

## 2022-04-13 DIAGNOSIS — M4802 Spinal stenosis, cervical region: Secondary | ICD-10-CM

## 2022-04-13 DIAGNOSIS — Z981 Arthrodesis status: Secondary | ICD-10-CM

## 2022-04-13 MED ORDER — GADOBENATE DIMEGLUMINE 529 MG/ML (0.1MMOL/0.2ML) IV SOLN
20 mL | Freq: Once | INTRAVENOUS | 0 refills | Status: CP
Start: 2022-04-13 — End: ?
  Administered 2022-04-13: 15:00:00 20 mL via INTRAVENOUS

## 2022-04-18 ENCOUNTER — Encounter: Admit: 2022-04-18 | Discharge: 2022-04-18 | Payer: MEDICARE

## 2022-04-18 ENCOUNTER — Ambulatory Visit: Admit: 2022-04-18 | Discharge: 2022-04-18 | Payer: MEDICARE

## 2022-04-18 DIAGNOSIS — M4316 Spondylolisthesis, lumbar region: Secondary | ICD-10-CM

## 2022-04-18 DIAGNOSIS — M5416 Radiculopathy, lumbar region: Secondary | ICD-10-CM

## 2022-04-18 DIAGNOSIS — M545 Acute exacerbation of chronic low back pain: Secondary | ICD-10-CM

## 2022-04-18 NOTE — Progress Notes
SPINE CENTER CLINIC NOTE       SUBJECTIVE:   Kristen Vaughn is a 61 y.o.-year-old female with history of ACDF who presents for follow-up after lumbar medial branch block on 02/06/22. Patient reports no relief with the injections. She reports a fall 5 days ago involving uneven bricks and unsteady step. She reports increased pain in the back radiating down both legs instead of just the left. Pain is worse with standing, walking, and sitting. Pain is better with nothing. She continues to take Lyrica and Celebrex. She is interested in additional treatment options. She had an MRI of the lumbar spine in August 2022 and radiographs of lumbar spine on 03/02/22. Pain is not at preprocedure level. VAS pain score is rated a 7/10. She has been continuing with home exercise program.  Denies any loss of control of bowel or bladder.        Review of Systems    Current Outpatient Medications:   ?  acetaminophen (TYLENOL) 500 mg tablet, Take two tablets by mouth every 6 hours as needed for Pain. Max of 4,000 mg of acetaminophen in 24 hours., Disp: 500 tablet, Rfl: 1  ?  albuterol sulfate (PROAIR HFA) 90 mcg/actuation HFA aerosol inhaler, Inhale two puffs by mouth into the lungs every 6 hours as needed for Wheezing or Shortness of Breath. Shake well before use., Disp: , Rfl:   ?  AMITR/GABAPEN/EMU OIL 12/26/08% CREAM (COMPOUND), Apply 0.5grams to affected area three times daily PRN for pain, Disp: 50 g, Rfl: 3  ?  aspirin EC 81 mg tablet, Take one tablet by mouth daily. Take with food., Disp: , Rfl:   ?  baclofen (LIORESAL) 10 mg tablet, Take one tablet by mouth three times daily., Disp: , Rfl:   ?  busPIRone (BUSPAR) 5 mg tablet, Take two tablets by mouth daily., Disp: , Rfl:   ?  calcium carbonate (CALCIUM 600 PO), Take 1 tablet by mouth daily., Disp: , Rfl:   ?  celecoxib (CELEBREX) 200 mg capsule, Take one capsule by mouth daily., Disp: , Rfl:   ?  cetirizine (ZYRTEC) 10 mg tablet, Take one tablet by mouth daily as needed for Allergy symptoms., Disp: , Rfl:   ?  Cholecalciferol (Vitamin D3) (VITAMIN D-3) 2,000 unit cap, Take two capsules by mouth daily. (Patient taking differently: Take one capsule by mouth daily.), Disp: 300 capsule, Rfl: 11  ?  clonazePAM (KLONOPIN) 1 mg tablet, Take one tablet by mouth daily as needed., Disp: , Rfl:   ?  denosumab (PROLIA) 60 mg/mL injection, Inject 1 mL under the skin every 180 days., Disp: , Rfl:   ?  desvenlafaxine succinate (PRISTIQ) 100 mg tablet, Take one tablet by mouth daily., Disp: , Rfl:   ?  diclofenac (VOLTAREN) 1 % topical gel, Apply four g topically to affected area four times daily., Disp: , Rfl:   ?  fish oil- omega 3-DHA/EPA 300/1,000 mg capsule, Take one capsule by mouth daily., Disp: , Rfl:   ?  ketoconazole (NIZORAL) 2 % topical cream, Apply  topically to affected area twice daily., Disp: 60 g, Rfl: 11  ?  lidocaine (LIDODERM) 5 % topical patch, Apply one patch topically to affected area daily. Apply patch for 12 hours, then remove for 12 hours before repeating., Disp: 90 patch, Rfl: 3  ?  lidocaine/prilocaine (EMLA) 2.5/2.5 % topical cream, APPLY TOPICALLY TO AFFECTED AREA AS NEEDED., Disp: 30 g, Rfl: 11  ?  losartan (COZAAR) 25 mg tablet, Take  one tablet by mouth daily. Indications: high blood pressure, Disp: 30 tablet, Rfl: 3  ?  mirtazapine (REMERON) 15 mg tablet, Take one tablet by mouth at bedtime daily., Disp: , Rfl:   ?  mupirocin (BACTROBAN) 2 % topical ointment, Apply  topically to affected area twice daily., Disp: 22 g, Rfl: 11  ?  omeprazole DR(+) (PRILOSEC) 40 mg capsule, Take one capsule by mouth daily before breakfast., Disp: , Rfl:   ?  pregabalin (LYRICA) 200 mg capsule, TAKE 1 CAPSULE BY MOUTH THREE TIMES A DAY, Disp: 90 capsule, Rfl: 2  ?  REXULTI 2 mg tablet, Take one tablet by mouth daily., Disp: , Rfl:   ?  rosuvastatin (CRESTOR) 10 mg tablet, TAKE ONE TABLET BY MOUTH DAILY., Disp: 90 tablet, Rfl: 3  ?  triamcinolone acetonide (KENALOG) 0.1 % topical ointment, APPLY TO AFFECTED AREA TWICE A DAY, Disp: 30 g, Rfl: 3  ?  walker (ULTRA-LIGHT ROLLATOR) medical supply, Use as directed., Disp: 1 each, Rfl: 0    Current Facility-Administered Medications:   ?  diazePAM (VALIUM) tablet 5 mg, 5 mg, Oral, PRN, [name], MD, 5 mg at 04/27/19 1429  Allergies   Allergen Reactions   ? Erythromycin RASH     Rash on trunk   ? Ascorbic Acid NAUSEA ONLY and UNKNOWN   ? Strawberry UNKNOWN     Physical Exam  Vitals:    04/18/22 1012   BP: 129/78   BP Source: Arm, Right Upper   Pulse: 68   SpO2: 97%   PainSc: Seven   Weight: 106.8 kg (235 lb 7.2 oz)   Height: 170.2 cm (5' 7)     Oswestry Total Score:: 80  Pain Score: Seven  Body mass index is 36.88 kg/m?Marland Kitchen  General: 61 y.o. female appears stated age, in no acute distress  HEENT: Normocephalic, atraumatic  Neck: No thyroidmegaly  Cardiovascular: Well perfused  Pulmonary: Unlabored respirations  Extremities: No cyanosis, clubbing, or edema  Skin: Warm and dry  Psychiatric:  Appropriate mood and affect  Musculoskeletal: Decreased range of motion with lumbar flexion, extension, and lateral rotation.  Tender to palpation at L4-L5, L5-S1 facets, gluteal musculature, and left greater trochanter bursa. Facet loading is positive bilaterally.  Neurologic: Lower extremity myotomes are all 5/5.  Lower extremity dermatomes are all intact to light touch.  Deep tendon reflexes are symmetric at patella and achilles. Slump test positive on the left, negative on the right.    No ankle clonus.     Radiographs of the lumbar spine on 03/02/22 were personally reviewed and demonstrated grade 1 anterolisthesis at L4-L5 with moderate lumbar facet arthropathy.       IMPRESSION:  1. Spondylolisthesis, lumbar region    2. Lumbar radiculopathy    3. Acute exacerbation of chronic low back pain          PLAN:    1.  Lifestyle modifications.  Recommend activity as tolerated.  Avoid provocative maneuvers.  Keep spine in neutral position.  2.  Medications.  Continue Lyrica and Celebrex. No other changes indicated at this time. Medication usage and safety reviewed.   3.  Therapy.  She has participated in formal physical therapy on multiple occasions without relief. Continue home exercises.   4.  Imaging.  None indicated at this time.  5.  Interventions. Will discuss with Dr. Katrinka Blazing may consider caudal epidural steroid injection. Risk of the procedure were discussed including pain, bleeding, infection, and damage to nearby structures.  6.  Follow-up.  Patient to follow-up for injection.

## 2022-04-24 ENCOUNTER — Encounter: Admit: 2022-04-24 | Discharge: 2022-04-24 | Payer: MEDICARE

## 2022-04-24 MED ORDER — LOSARTAN 25 MG PO TAB
25 mg | ORAL_TABLET | Freq: Every day | ORAL | 3 refills | 90.00000 days | Status: AC
Start: 2022-04-24 — End: ?

## 2022-04-24 MED ORDER — PREGABALIN 200 MG PO CAP
ORAL_CAPSULE | 2 refills
Start: 2022-04-24 — End: ?

## 2022-04-30 ENCOUNTER — Encounter: Admit: 2022-04-30 | Discharge: 2022-04-30 | Payer: MEDICARE

## 2022-04-30 ENCOUNTER — Ambulatory Visit: Admit: 2022-04-30 | Discharge: 2022-05-01 | Payer: MEDICARE

## 2022-04-30 DIAGNOSIS — G959 Disease of spinal cord, unspecified: Secondary | ICD-10-CM

## 2022-04-30 DIAGNOSIS — R519 Generalized headaches: Secondary | ICD-10-CM

## 2022-04-30 DIAGNOSIS — K219 Gastro-esophageal reflux disease without esophagitis: Secondary | ICD-10-CM

## 2022-04-30 DIAGNOSIS — Z86010 Personal history of colonic polyps: Secondary | ICD-10-CM

## 2022-04-30 DIAGNOSIS — M4800 Spinal stenosis, site unspecified: Secondary | ICD-10-CM

## 2022-04-30 DIAGNOSIS — Z8679 Personal history of other diseases of the circulatory system: Secondary | ICD-10-CM

## 2022-04-30 DIAGNOSIS — I8390 Asymptomatic varicose veins of unspecified lower extremity: Secondary | ICD-10-CM

## 2022-04-30 DIAGNOSIS — I10 Essential (primary) hypertension: Secondary | ICD-10-CM

## 2022-04-30 DIAGNOSIS — J189 Pneumonia, unspecified organism: Secondary | ICD-10-CM

## 2022-04-30 DIAGNOSIS — M4802 Spinal stenosis, cervical region: Secondary | ICD-10-CM

## 2022-04-30 DIAGNOSIS — K5900 Constipation, unspecified: Secondary | ICD-10-CM

## 2022-04-30 DIAGNOSIS — E785 Hyperlipidemia, unspecified: Secondary | ICD-10-CM

## 2022-04-30 DIAGNOSIS — Z8614 Personal history of Methicillin resistant Staphylococcus aureus infection: Secondary | ICD-10-CM

## 2022-04-30 DIAGNOSIS — J45909 Unspecified asthma, uncomplicated: Secondary | ICD-10-CM

## 2022-04-30 DIAGNOSIS — T148XXA Other injury of unspecified body region, initial encounter: Secondary | ICD-10-CM

## 2022-04-30 DIAGNOSIS — J439 Emphysema, unspecified: Secondary | ICD-10-CM

## 2022-04-30 DIAGNOSIS — M2550 Pain in unspecified joint: Secondary | ICD-10-CM

## 2022-04-30 DIAGNOSIS — M5020 Other cervical disc displacement, unspecified cervical region: Secondary | ICD-10-CM

## 2022-04-30 DIAGNOSIS — G5600 Carpal tunnel syndrome, unspecified upper limb: Secondary | ICD-10-CM

## 2022-04-30 DIAGNOSIS — F419 Anxiety disorder, unspecified: Secondary | ICD-10-CM

## 2022-04-30 DIAGNOSIS — M797 Fibromyalgia: Secondary | ICD-10-CM

## 2022-04-30 MED ORDER — NICOTINE 21-14-7 MG/24 HR TD PTDS
1 | MEDICATED_PATCH | TRANSDERMAL | 0 refills | Status: AC
Start: 2022-04-30 — End: ?

## 2022-04-30 MED ORDER — NICOTINE (POLACRILEX) 4 MG BU LOZG
4 mg | LOZENGE | BUCCAL | 3 refills | Status: AC | PRN
Start: 2022-04-30 — End: ?

## 2022-04-30 MED ORDER — FLUTICASONE PROPIONATE 50 MCG/ACTUATION NA SPSN
2 | Freq: Every day | NASAL | 3 refills | 60.00000 days | Status: AC
Start: 2022-04-30 — End: ?

## 2022-04-30 NOTE — Assessment & Plan Note
-   currently smoking 1/2 ppd and interested in quitting  - discussed use of long acting, and short acting, nicotine replacement therapy  - plan to use patches and lozenges

## 2022-04-30 NOTE — Assessment & Plan Note
-   increase in cough with associated eye watering, itching and post-nasal drip despite daily compliance with Zyrtec  - add Flonase daily (reviewed proper administration technique)

## 2022-04-30 NOTE — Progress Notes
Date of Service: 04/30/2022    Subjective:             SHAKIARA Vaughn is a 61 y.o. female smoker who presents today for follow-up on shortness of breath and cough.     History of Present Illness    Presents today with her caretaker who contributes to HPI. Reports symptoms have been a little worse recently with increase in shortness of breath. Notes shortness of breath occurs with cough. Coughs so hard that it makes her dizzy and she sees black. Associated itchy/watery eyes and post-nasal drip. Worse with lying down.   Still smoking 1/2 ppd and very interested in quitting. Has tried patches alone in the past but failed after a few days.     Sleeping ok. Daytime energy is ok - no naps. No morning headaches. Caretaker notes very loud snoring. Patient with multiple siblings, and son, with severe sleep apnea.     Notes she had a sister pass form small cell lung cancer who was also a smoker.          Review of Systems   Eyes: Positive for discharge and itching.   Gastrointestinal: Positive for diarrhea.   Musculoskeletal: Positive for back pain, gait problem and neck pain.   Neurological: Positive for dizziness and light-headedness.   Psychiatric/Behavioral: The patient is nervous/anxious.    All other systems reviewed and are negative.        Objective:         ? acetaminophen (TYLENOL) 500 mg tablet Take two tablets by mouth every 6 hours as needed for Pain. Max of 4,000 mg of acetaminophen in 24 hours.   ? albuterol sulfate (PROAIR HFA) 90 mcg/actuation HFA aerosol inhaler Inhale two puffs by mouth into the lungs every 6 hours as needed for Wheezing or Shortness of Breath. Shake well before use.   ? AMITR/GABAPEN/EMU OIL 12/26/08% CREAM (COMPOUND) Apply 0.5grams to affected area three times daily PRN for pain   ? aspirin EC 81 mg tablet Take one tablet by mouth daily. Take with food.   ? baclofen (LIORESAL) 10 mg tablet Take one tablet by mouth three times daily.   ? calcium carbonate (CALCIUM 600 PO) Take 1 tablet by mouth daily.   ? celecoxib (CELEBREX) 200 mg capsule Take one capsule by mouth daily.   ? cetirizine (ZYRTEC) 10 mg tablet Take one tablet by mouth daily as needed for Allergy symptoms.   ? Cholecalciferol (Vitamin D3) (VITAMIN D-3) 2,000 unit cap Take two capsules by mouth daily. (Patient taking differently: Take one capsule by mouth daily.)   ? clonazePAM (KLONOPIN) 1 mg tablet Take one tablet by mouth daily as needed.   ? denosumab (PROLIA) 60 mg/mL injection Inject 1 mL under the skin every 180 days.   ? desvenlafaxine succinate (PRISTIQ) 100 mg tablet Take one tablet by mouth daily.   ? diclofenac (VOLTAREN) 1 % topical gel Apply four g topically to affected area four times daily.   ? fish oil- omega 3-DHA/EPA 300/1,000 mg capsule Take one capsule by mouth daily.   ? fluticasone propionate (FLONASE ALLERGY RELIEF) 50 mcg/actuation nasal spray, suspension Apply two sprays to each nostril as directed daily.   ? ketoconazole (NIZORAL) 2 % topical cream Apply  topically to affected area twice daily.   ? lidocaine (LIDODERM) 5 % topical patch Apply one patch topically to affected area daily. Apply patch for 12 hours, then remove for 12 hours before repeating.   ? lidocaine/prilocaine (EMLA)  2.5/2.5 % topical cream APPLY TOPICALLY TO AFFECTED AREA AS NEEDED.   ? losartan (COZAAR) 25 mg tablet TAKE ONE TABLET BY MOUTH DAILY. INDICATIONS: HIGH BLOOD PRESSURE   ? mirtazapine (REMERON) 15 mg tablet Take one tablet by mouth at bedtime daily.   ? mupirocin (BACTROBAN) 2 % topical ointment Apply  topically to affected area twice daily.   ? nicotine (polacrilex) (NICORETTE) 4 mg buccal lozenge Place one lozenge inside cheek (side of mouth) every 1 hour while awake as needed. Place 1 lozenge inside cheek (side of mouth) as needed. May use 1 per hour, not to exceed 20 per day for 12 weeks. May use longer if needed.   ? nicotine 21-14-7 mg/24 hr transdermal patch starter kit Apply one patch to top of skin as directed every 24 hours.   ? omeprazole DR(+) (PRILOSEC) 40 mg capsule Take one capsule by mouth daily before breakfast.   ? pregabalin (LYRICA) 200 mg capsule TAKE 1 CAPSULE BY MOUTH THREE TIMES A DAY   ? REXULTI 2 mg tablet Take one tablet by mouth daily.   ? rosuvastatin (CRESTOR) 10 mg tablet TAKE ONE TABLET BY MOUTH DAILY.   ? triamcinolone acetonide (KENALOG) 0.1 % topical ointment APPLY TO AFFECTED AREA TWICE A DAY   ? walker (ULTRA-LIGHT ROLLATOR) medical supply Use as directed.     Vitals:    04/30/22 0859   BP: 126/83   Pulse: 73   Temp: 36.1 ?C (97 ?F)   Resp: 16   SpO2: 97%   TempSrc: Temporal   PainSc: Eight   Weight: 107.7 kg (237 lb 6.4 oz)   Height: 170.2 cm (5' 7)     Body mass index is 37.18 kg/m?Marland Kitchen     Physical Exam  Vitals reviewed.   Constitutional:       Appearance: Normal appearance. She is obese.   HENT:      Head: Normocephalic.      Nose: No congestion.      Mouth/Throat:      Mouth: Mucous membranes are moist.      Comments: Mallampati IV  Eyes:      Pupils: Pupils are equal, round, and reactive to light.   Neck:      Comments: NC 16  Cardiovascular:      Rate and Rhythm: Normal rate and regular rhythm.      Heart sounds: Normal heart sounds.   Pulmonary:      Effort: Pulmonary effort is normal.      Breath sounds: No wheezing or rales.   Musculoskeletal:         General: No swelling.      Cervical back: Neck supple.   Skin:     General: Skin is warm and dry.   Neurological:      General: No focal deficit present.      Mental Status: She is alert and oriented to person, place, and time.   Psychiatric:         Mood and Affect: Mood normal.         Behavior: Behavior normal.         Thought Content: Thought content normal.         Judgment: Judgment normal.              Assessment and Plan:    Problem   Snoring   Seasonal Allergies   Tobacco Abuse    Smoked 1/2 to 1 pack cigarettes daily for 40 years.  Stopped (  almost) late October 2019     Tobacco Abuse (Resolved)        Tobacco abuse  - currently smoking 1/2 ppd and interested in quitting  - discussed use of long acting, and short acting, nicotine replacement therapy  - plan to use patches and lozenges     Snoring  - multiple first degree relatives with severe OSA and reported loud snoring  - NC of 16 with Mallampati IV  - plan for PSG to assess for underlying OSA    Seasonal allergies  - increase in cough with associated eye watering, itching and post-nasal drip despite daily compliance with Zyrtec  - add Flonase daily (reviewed proper administration technique)    RTC 6 mo  Anselm Lis, APRN-NP    Orders Placed This Encounter   ? SLEEP STUDY   ? nicotine (polacrilex) (NICORETTE) 4 mg buccal lozenge   ? fluticasone propionate (FLONASE ALLERGY RELIEF) 50 mcg/actuation nasal spray, suspension   ? nicotine 21-14-7 mg/24 hr transdermal patch starter kit     Future Appointments   Date Time Provider Department Center   05/04/2022  8:30 AM APP [facility] SPINE   05/22/2022  4:00 PM [name], MD [facility] SPINE   08/24/2022 10:45 AM Sula Soda, MD [facility] IM   09/04/2022  2:00 PM Otilio Miu, Janan Ridge, MD [facility] IM   09/26/2022 10:00 AM Hanley Hays, APRN-NP [id number] Plastic Surg   10/26/2022 10:00 AM Anselm Lis, APRN-NP [facility] IM

## 2022-04-30 NOTE — Assessment & Plan Note
-   multiple first degree relatives with severe OSA and reported loud snoring  - NC of 16" with Mallampati IV  - plan for PSG to assess for underlying OSA

## 2022-05-01 DIAGNOSIS — Z720 Tobacco use: Secondary | ICD-10-CM

## 2022-05-01 DIAGNOSIS — R0683 Snoring: Secondary | ICD-10-CM

## 2022-05-01 DIAGNOSIS — J302 Other seasonal allergic rhinitis: Secondary | ICD-10-CM

## 2022-05-03 NOTE — Patient Instructions
It was a pleasure seeing you in clinic today.    Lonisha Bobby RN, CNC  Dr. Joshua T. Bunch/Ortho Spine Surgery  [facility]  [facility]  [address]  [place], [place] [address]  Phone: [phone]   Fax [phone]  Scheduling [phone]  [email or web address]    We appreciate the interest in your health.  The physician reviews both the radiology reports and the imaging independently and will contact you if there are any emergent findings.  If you would like, incidental and chronic changes seen in your images can be reviewed with Dr. Bunch at a follow up appointment.  At that appointment, the radiology report and terminology used in that report can also be explained. Please call our Schedule line if you would like to follow up.

## 2022-05-04 ENCOUNTER — Encounter: Admit: 2022-05-04 | Discharge: 2022-05-04 | Payer: MEDICARE

## 2022-05-04 ENCOUNTER — Ambulatory Visit: Admit: 2022-05-04 | Discharge: 2022-05-05 | Payer: MEDICARE

## 2022-05-04 DIAGNOSIS — M4802 Spinal stenosis, cervical region: Secondary | ICD-10-CM

## 2022-05-04 DIAGNOSIS — M797 Fibromyalgia: Secondary | ICD-10-CM

## 2022-05-04 DIAGNOSIS — M5020 Other cervical disc displacement, unspecified cervical region: Secondary | ICD-10-CM

## 2022-05-04 DIAGNOSIS — G959 Disease of spinal cord, unspecified: Secondary | ICD-10-CM

## 2022-05-04 DIAGNOSIS — J189 Pneumonia, unspecified organism: Secondary | ICD-10-CM

## 2022-05-04 DIAGNOSIS — Z8679 Personal history of other diseases of the circulatory system: Secondary | ICD-10-CM

## 2022-05-04 DIAGNOSIS — K219 Gastro-esophageal reflux disease without esophagitis: Secondary | ICD-10-CM

## 2022-05-04 DIAGNOSIS — J45909 Unspecified asthma, uncomplicated: Secondary | ICD-10-CM

## 2022-05-04 DIAGNOSIS — Z8614 Personal history of Methicillin resistant Staphylococcus aureus infection: Secondary | ICD-10-CM

## 2022-05-04 DIAGNOSIS — T148XXA Other injury of unspecified body region, initial encounter: Secondary | ICD-10-CM

## 2022-05-04 DIAGNOSIS — I10 Essential (primary) hypertension: Secondary | ICD-10-CM

## 2022-05-04 DIAGNOSIS — K5900 Constipation, unspecified: Secondary | ICD-10-CM

## 2022-05-04 DIAGNOSIS — M4800 Spinal stenosis, site unspecified: Secondary | ICD-10-CM

## 2022-05-04 DIAGNOSIS — Z981 Arthrodesis status: Secondary | ICD-10-CM

## 2022-05-04 DIAGNOSIS — E785 Hyperlipidemia, unspecified: Secondary | ICD-10-CM

## 2022-05-04 DIAGNOSIS — F419 Anxiety disorder, unspecified: Secondary | ICD-10-CM

## 2022-05-04 DIAGNOSIS — J439 Emphysema, unspecified: Secondary | ICD-10-CM

## 2022-05-04 DIAGNOSIS — G5600 Carpal tunnel syndrome, unspecified upper limb: Secondary | ICD-10-CM

## 2022-05-04 DIAGNOSIS — M2550 Pain in unspecified joint: Secondary | ICD-10-CM

## 2022-05-04 DIAGNOSIS — R519 Generalized headaches: Secondary | ICD-10-CM

## 2022-05-04 DIAGNOSIS — I8390 Asymptomatic varicose veins of unspecified lower extremity: Secondary | ICD-10-CM

## 2022-05-04 DIAGNOSIS — Z86010 Personal history of colonic polyps: Secondary | ICD-10-CM

## 2022-05-09 ENCOUNTER — Encounter: Admit: 2022-05-09 | Discharge: 2022-05-09 | Payer: MEDICARE

## 2022-05-09 ENCOUNTER — Ambulatory Visit: Admit: 2022-05-09 | Discharge: 2022-05-09 | Payer: MEDICARE

## 2022-05-09 DIAGNOSIS — R0683 Snoring: Secondary | ICD-10-CM

## 2022-05-10 NOTE — Progress Notes
Scoliosis xray dated 03/02/2022  sent to PCP on file. Report recommends a CXR.

## 2022-05-17 ENCOUNTER — Encounter: Admit: 2022-05-17 | Discharge: 2022-05-17 | Payer: MEDICARE

## 2022-05-17 DIAGNOSIS — R0681 Apnea, not elsewhere classified: Secondary | ICD-10-CM

## 2022-05-17 NOTE — Progress Notes
Sleep study is abnormal. Recommend follow up with sleep clinic for further evaluation and management of sleep disorder.     Sleep study report should be uploaded in EPIC soon, please see the final report for details.

## 2022-05-21 ENCOUNTER — Encounter: Admit: 2022-05-21 | Discharge: 2022-05-21 | Payer: MEDICARE

## 2022-05-22 ENCOUNTER — Encounter: Admit: 2022-05-22 | Discharge: 2022-05-22 | Payer: MEDICARE

## 2022-06-04 ENCOUNTER — Encounter: Admit: 2022-06-04 | Discharge: 2022-06-04 | Payer: MEDICARE

## 2022-06-04 NOTE — Progress Notes
Phone call placed to PCP on file.  LVM with office staff regarding recommendations made on Scoliosis xray dated 03/02/2022. Requested phone call back to verify receipt of records and discuss recommendations.

## 2022-06-21 ENCOUNTER — Encounter: Admit: 2022-06-21 | Discharge: 2022-06-21 | Payer: MEDICARE

## 2022-06-21 MED ORDER — ROSUVASTATIN 10 MG PO TAB
10 mg | ORAL_TABLET | Freq: Every day | ORAL | 3 refills | 90.00000 days | Status: AC
Start: 2022-06-21 — End: ?

## 2022-06-22 ENCOUNTER — Encounter: Admit: 2022-06-22 | Discharge: 2022-06-22 | Payer: MEDICARE

## 2022-06-22 DIAGNOSIS — M5416 Radiculopathy, lumbar region: Secondary | ICD-10-CM

## 2022-06-26 ENCOUNTER — Encounter: Admit: 2022-06-26 | Discharge: 2022-06-26 | Payer: MEDICARE

## 2022-07-03 ENCOUNTER — Encounter: Admit: 2022-07-03 | Discharge: 2022-07-03 | Payer: MEDICARE

## 2022-07-03 NOTE — Telephone Encounter
Pt called and lvm asking for a call back - had question about sleep study.  Called and lvm for pt to call back/send mychart message with questions.

## 2022-07-12 ENCOUNTER — Encounter: Admit: 2022-07-12 | Discharge: 2022-07-12 | Payer: MEDICARE

## 2022-07-12 NOTE — Telephone Encounter
An encounter has been created for documentation only (often for preparation of an upcoming appointment or for follow up on orders/imaging or records received) and patient does not need contact RN and did not miss a phone call or appointment.     Pre-visit planning for upcoming appointment with Dr.Luthra    NEW PT    -Referred TK:[id number], Rosey Bath, APRN-NP  [facility]     -NA:[id number]    -Last SS: 05/09/22

## 2022-07-13 ENCOUNTER — Ambulatory Visit: Admit: 2022-07-13 | Discharge: 2022-07-13 | Payer: MEDICARE

## 2022-07-13 ENCOUNTER — Encounter: Admit: 2022-07-13 | Discharge: 2022-07-13 | Payer: MEDICARE

## 2022-07-13 DIAGNOSIS — M5020 Other cervical disc displacement, unspecified cervical region: Secondary | ICD-10-CM

## 2022-07-13 DIAGNOSIS — G2581 Restless legs syndrome: Secondary | ICD-10-CM

## 2022-07-13 DIAGNOSIS — R0683 Snoring: Secondary | ICD-10-CM

## 2022-07-13 DIAGNOSIS — M4800 Spinal stenosis, site unspecified: Secondary | ICD-10-CM

## 2022-07-13 DIAGNOSIS — E785 Hyperlipidemia, unspecified: Secondary | ICD-10-CM

## 2022-07-13 DIAGNOSIS — G4721 Circadian rhythm sleep disorder, delayed sleep phase type: Secondary | ICD-10-CM

## 2022-07-13 DIAGNOSIS — Z86010 Personal history of colonic polyps: Secondary | ICD-10-CM

## 2022-07-13 DIAGNOSIS — J439 Emphysema, unspecified: Secondary | ICD-10-CM

## 2022-07-13 DIAGNOSIS — I8390 Asymptomatic varicose veins of unspecified lower extremity: Secondary | ICD-10-CM

## 2022-07-13 DIAGNOSIS — I10 Essential (primary) hypertension: Secondary | ICD-10-CM

## 2022-07-13 DIAGNOSIS — J45909 Unspecified asthma, uncomplicated: Secondary | ICD-10-CM

## 2022-07-13 DIAGNOSIS — I619 Nontraumatic intracerebral hemorrhage, unspecified: Secondary | ICD-10-CM

## 2022-07-13 DIAGNOSIS — G5600 Carpal tunnel syndrome, unspecified upper limb: Secondary | ICD-10-CM

## 2022-07-13 DIAGNOSIS — M2550 Pain in unspecified joint: Secondary | ICD-10-CM

## 2022-07-13 DIAGNOSIS — F419 Anxiety disorder, unspecified: Secondary | ICD-10-CM

## 2022-07-13 DIAGNOSIS — M797 Fibromyalgia: Secondary | ICD-10-CM

## 2022-07-13 DIAGNOSIS — R519 Generalized headaches: Secondary | ICD-10-CM

## 2022-07-13 DIAGNOSIS — J189 Pneumonia, unspecified organism: Secondary | ICD-10-CM

## 2022-07-13 DIAGNOSIS — K219 Gastro-esophageal reflux disease without esophagitis: Secondary | ICD-10-CM

## 2022-07-13 DIAGNOSIS — G959 Disease of spinal cord, unspecified: Secondary | ICD-10-CM

## 2022-07-13 DIAGNOSIS — Z8679 Personal history of other diseases of the circulatory system: Secondary | ICD-10-CM

## 2022-07-13 DIAGNOSIS — M4802 Spinal stenosis, cervical region: Secondary | ICD-10-CM

## 2022-07-13 DIAGNOSIS — R400 Somnolence: Secondary | ICD-10-CM

## 2022-07-13 DIAGNOSIS — T148XXA Other injury of unspecified body region, initial encounter: Secondary | ICD-10-CM

## 2022-07-13 DIAGNOSIS — Z8614 Personal history of Methicillin resistant Staphylococcus aureus infection: Secondary | ICD-10-CM

## 2022-07-13 DIAGNOSIS — K5900 Constipation, unspecified: Secondary | ICD-10-CM

## 2022-07-13 NOTE — Assessment & Plan Note
Will order for HSAT for better accuracy of ruling out OSA (PSG was normal)  Oral appliance may be needed if it's only primary snoring

## 2022-07-13 NOTE — Assessment & Plan Note
Dim light starting at 9pm with small dose 0.5mg  Melatonin  Start with bright light in morning for 15-23min  As mertazapin is not helping will plan to come off it. Pt to talk to her psychiatry.

## 2022-07-13 NOTE — Assessment & Plan Note
Check Ferritin level  Reduce caffeine to a max of 200mg /day (2-3 cups a day), consider other options like tea or decaf coffee, all before 2 pm at the latest.  Continue on Lyrica for now  Talk to psychiatrist to see if she can be switched to Welbutrin instead of Pristiq

## 2022-08-10 ENCOUNTER — Encounter: Admit: 2022-08-10 | Discharge: 2022-08-10 | Payer: MEDICARE

## 2022-08-10 DIAGNOSIS — M4316 Spondylolisthesis, lumbar region: Secondary | ICD-10-CM

## 2022-08-10 DIAGNOSIS — M545 Acute exacerbation of chronic low back pain: Secondary | ICD-10-CM

## 2022-08-10 DIAGNOSIS — M5416 Radiculopathy, lumbar region: Secondary | ICD-10-CM

## 2022-08-10 MED ORDER — PREGABALIN 200 MG PO CAP
ORAL_CAPSULE | 0 refills
Start: 2022-08-10 — End: ?

## 2022-08-11 ENCOUNTER — Encounter: Admit: 2022-08-11 | Discharge: 2022-08-11 | Payer: MEDICARE

## 2022-08-11 MED ORDER — PREGABALIN 200 MG PO CAP
200 mg | ORAL_CAPSULE | Freq: Three times a day (TID) | ORAL | 0 refills | Status: AC
Start: 2022-08-11 — End: ?

## 2022-08-11 NOTE — Telephone Encounter
Patient states that she received her last refill of her Lyrica last month no longer has any more.  She has her next follow-up visit with Dr. Durel Salts on Tuesday 11/21.  She attempted to contact clinic on Friday for medication refill but had no response.  Makes her very nervous as last time she stopped taking Lyrica she had seizures.  Would like to have small dose until follow-up to discuss further her continuing on the medication when she has her appointment.

## 2022-08-13 ENCOUNTER — Encounter: Admit: 2022-08-13 | Discharge: 2022-08-13 | Payer: MEDICARE

## 2022-08-14 ENCOUNTER — Encounter: Admit: 2022-08-14 | Discharge: 2022-08-14 | Payer: MEDICARE

## 2022-08-14 ENCOUNTER — Ambulatory Visit: Admit: 2022-08-14 | Discharge: 2022-08-14 | Payer: MEDICARE

## 2022-08-14 DIAGNOSIS — G959 Disease of spinal cord, unspecified: Secondary | ICD-10-CM

## 2022-08-14 DIAGNOSIS — J189 Pneumonia, unspecified organism: Secondary | ICD-10-CM

## 2022-08-14 DIAGNOSIS — I619 Nontraumatic intracerebral hemorrhage, unspecified: Secondary | ICD-10-CM

## 2022-08-14 DIAGNOSIS — M5020 Other cervical disc displacement, unspecified cervical region: Secondary | ICD-10-CM

## 2022-08-14 DIAGNOSIS — M4802 Spinal stenosis, cervical region: Secondary | ICD-10-CM

## 2022-08-14 DIAGNOSIS — M5416 Radiculopathy, lumbar region: Secondary | ICD-10-CM

## 2022-08-14 DIAGNOSIS — E785 Hyperlipidemia, unspecified: Secondary | ICD-10-CM

## 2022-08-14 DIAGNOSIS — I8390 Asymptomatic varicose veins of unspecified lower extremity: Secondary | ICD-10-CM

## 2022-08-14 DIAGNOSIS — Z8614 Personal history of Methicillin resistant Staphylococcus aureus infection: Secondary | ICD-10-CM

## 2022-08-14 DIAGNOSIS — Z8679 Personal history of other diseases of the circulatory system: Secondary | ICD-10-CM

## 2022-08-14 DIAGNOSIS — M797 Fibromyalgia: Secondary | ICD-10-CM

## 2022-08-14 DIAGNOSIS — K5900 Constipation, unspecified: Secondary | ICD-10-CM

## 2022-08-14 DIAGNOSIS — J439 Emphysema, unspecified: Secondary | ICD-10-CM

## 2022-08-14 DIAGNOSIS — F419 Anxiety disorder, unspecified: Secondary | ICD-10-CM

## 2022-08-14 DIAGNOSIS — M4316 Spondylolisthesis, lumbar region: Secondary | ICD-10-CM

## 2022-08-14 DIAGNOSIS — T148XXA Other injury of unspecified body region, initial encounter: Secondary | ICD-10-CM

## 2022-08-14 DIAGNOSIS — M2550 Pain in unspecified joint: Secondary | ICD-10-CM

## 2022-08-14 DIAGNOSIS — J45909 Unspecified asthma, uncomplicated: Secondary | ICD-10-CM

## 2022-08-14 DIAGNOSIS — M545 Acute exacerbation of chronic low back pain: Secondary | ICD-10-CM

## 2022-08-14 DIAGNOSIS — G5600 Carpal tunnel syndrome, unspecified upper limb: Secondary | ICD-10-CM

## 2022-08-14 DIAGNOSIS — K219 Gastro-esophageal reflux disease without esophagitis: Secondary | ICD-10-CM

## 2022-08-14 DIAGNOSIS — M4800 Spinal stenosis, site unspecified: Secondary | ICD-10-CM

## 2022-08-14 DIAGNOSIS — R519 Generalized headaches: Secondary | ICD-10-CM

## 2022-08-14 DIAGNOSIS — Z86010 Personal history of colonic polyps: Secondary | ICD-10-CM

## 2022-08-14 DIAGNOSIS — M47816 Spondylosis without myelopathy or radiculopathy, lumbar region: Secondary | ICD-10-CM

## 2022-08-14 DIAGNOSIS — I10 Essential (primary) hypertension: Secondary | ICD-10-CM

## 2022-08-14 MED ORDER — DIAZEPAM 5 MG PO TAB
5 mg | ORAL | 0 refills | Status: DC | PRN
Start: 2022-08-14 — End: 2022-08-15
  Administered 2022-08-14: 21:00:00 5 mg via ORAL

## 2022-08-14 MED ORDER — PREGABALIN 200 MG PO CAP
200 mg | ORAL_CAPSULE | Freq: Three times a day (TID) | ORAL | 1 refills | Status: AC
Start: 2022-08-14 — End: ?

## 2022-08-14 MED ORDER — TRIAMCINOLONE ACETONIDE 40 MG/ML IJ SUSP
80 mg | Freq: Once | EPIDURAL | 0 refills | Status: CP
Start: 2022-08-14 — End: ?
  Administered 2022-08-14: 21:00:00 80 mg via EPIDURAL

## 2022-08-14 MED ORDER — IOHEXOL 240 MG IODINE/ML IV SOLN
2.5 mL | Freq: Once | EPIDURAL | 0 refills | Status: CP
Start: 2022-08-14 — End: ?
  Administered 2022-08-14: 21:00:00 2.5 mL via EPIDURAL

## 2022-08-14 NOTE — Progress Notes
SPINE CENTER  INTERVENTIONAL PAIN PROCEDURE HISTORY AND PHYSICAL      Chief Complaint: Pain    HISTORY OF PRESENT ILLNESS:    Kristen ALMENDAREZ is a 61 y.o. year old female who presents for for interventional treatment of radicular pain. Patient denies any recent fevers, chills, infection, antibiotics, coagulopathy or contra-indicated anticoagulants. Risks of the procedure were discussed including but not limited to bleeding, infection, damage to surrounding structures and reaction to medications. Patient reports understanding and has elected to proceed with the procedure.  This patient's clinical history, exam, AND imaging support radiculopathy AND there is a significant impact on quality of life and function AND their pain score has been documented in this note AND the pain has been present for at least 4 weeks AND they have failed to improve with noninvasive conservative care.     Medical History:   Diagnosis Date   ? Anxiety and depression    ? Asthma    ? Brain bleed [facility]) [address] [facility] well   ? Carpal tunnel syndrome    ? Cervical myelopathy (HCC)    ? Cervical stenosis of spine    ? Chest pain    ? Constipation    ? Essential hypertension    ? Fibromyalgia    ? Generalized headaches    ? GERD (gastroesophageal reflux disease)    ? Herniated disc, cervical    ? History of abnormal electrocardiogram 01/2016   ? History of colon polyps 2015   ? History of MRSA infection 2019    right hand/[facility]   ? Hyperlipemia 08/09/2009   ? Joint pain    ? Nerve injury    ? Pneumonia    ? Pulmonary emphysema (HCC)    ? Spinal stenosis    ? Varicose veins        Surgical History:   Procedure Laterality Date   ? HX HYSTERECTOMY  1985   ? UPPER GASTROINTESTINAL ENDOSCOPY  2015   ? COLONOSCOPY  2015    hx colon polyps x2   ? CARDIOVASCULAR STRESS TEST  01/2016   ? FINGER TRIGGER RELEASE Right 2019   ? CARPAL TUNNEL RELEASE Right 2019   ? ANTERIOR CERVICAL DECOMPRESSION AND FUSION AT CERVICAL 6-7 N/A 10/28/2018    Performed by Criss Rosales, MD at [facility] OR   ? ARTHRODESIS SPINE ANTERIOR INTERBODY WITH DISCECTOMY/ OSTEOPHYTECTOMY/ DECOMPRESSION - CERVICAL BELOW C2 - EACH ADDITIONAL INTERSPACE N/A 10/28/2018    Performed by Criss Rosales, MD at [facility] OR   ? ANTERIOR INSTRUMENTATION - 2 TO 3 VERTEBRAL SEGMENTS N/A 10/28/2018    Performed by Criss Rosales, MD at [facility] OR   ? ABDOMEN SURGERY     ? DILATION AND CURETTAGE  1980's       family history includes Alcohol liver disease in her brother; Arthritis in her father; Cancer in her sister and another family member; Coronary Artery Disease in her father, mother, and other family members; Diabetes in her mother; Hypertension in her father; Joint Pain in her sister; Neck Pain in her sister; Osteoporosis in her sister; Premature Heart Disease in an other family member; Stroke in an other family member.    Social History     Socioeconomic History   ? Marital status: Single   Tobacco Use   ? Smoking status: Some Days     Packs/day: 0.50     Years: 40.00     Additional pack years: 0.00  Total pack years: 20.00     Types: Cigarettes     Passive exposure: Current   ? Smokeless tobacco: Never   ? Tobacco comments:     variable 1-1/2 a day, usually 1 or 2 per day.   Substance and Sexual Activity   ? Alcohol use: Not Currently     Comment: social   ? Drug use: Yes     Types: Marijuana     Comment: rare for back pain   ? Sexual activity: Never       Allergies   Allergen Reactions   ? Erythromycin RASH     Rash on trunk   ? Ascorbic Acid NAUSEA ONLY and UNKNOWN   ? Strawberry UNKNOWN       There were no vitals filed for this visit.     Oswestry Total Score:: (P) 80    REVIEW OF SYSTEMS: 10 point ROS obtained and negative except as stated above      PHYSICAL EXAM:  General: Alert, cooperative, no acute distress.  HEENT: Normocephalic, atraumatic.  Neck: Supple.  Lungs: Unlabored respirations, bilateral and equal chest excursion.  Heart: Regular rate.  Skin: Warm and dry to touch.  Abdomen: Nondistended.  MSK: No deformity  Neurological: Alert and oriented x3.          IMPRESSION:    1. Lumbar radiculopathy         PLAN:   Caudal ESI

## 2022-08-14 NOTE — Progress Notes

## 2022-08-14 NOTE — Procedures
Attending Surgeon: [name], MD    Anesthesia: Local    Pre-Procedure Diagnosis:   1. Lumbar radiculopathy    2. Spondylolisthesis, lumbar region    3. Lumbar spondylosis    4. Acute exacerbation of chronic low back pain        Post-Procedure Diagnosis:   1. Lumbar radiculopathy    2. Spondylolisthesis, lumbar region    3. Lumbar spondylosis    4. Acute exacerbation of chronic low back pain        Pain Score: Seven    [facility] SPINE INJECT INTERLAM LMBR/SAC  Procedure: epidural - interlaminar    Laterality: n/a   on 08/14/2022 2:30 PM  Location: caudal -  Caudal      Consent:   Consent obtained: verbal and written  Consent given by: patient  Risks discussed: allergic reaction, bleeding, bruising, infection, nerve damage, no change or worsening in pain, reaction to medication and weakness    Discussed with patient the purpose of the treatment/procedure, other ways of treating my condition, including no treatment/ procedure and the risks and benefits of the alternatives. Patient has decided to proceed with treatment/procedure.        Universal Protocol:  Relevant documents: relevant documents present and verified  Test results: test results available and properly labeled  Imaging studies: imaging studies available  Required items: required blood products, implants, devices, and special equipment available  Site marked: the operative site was marked  Patient identity confirmed: Patient identify confirmed verbally with patient.        Time out: Immediately prior to procedure a time out was called to verify the correct patient, procedure, equipment, support staff and site/side marked as required      Procedures Details:   Indications: pain   Prep: chlorhexidine  Patient position: prone  Estimated Blood Loss: minimal  Specimens: none  Number of Levels: 1  Approach: midline  Guidance: fluoroscopy  Contrast: Procedure confirmed with contrast under live fluoroscopy.  Needle and Epidural Catheter: quincke  Needle size: 22 G  Injection procedure: Incremental injection and Negative aspiration for blood  Patient tolerance: Patient tolerated the procedure well with no immediate complications. Pressure was applied, and hemostasis was accomplished.  Comments: DESCRIPTION OF PROCEDURE:  The procedure risks and benefits were explained and informed consent was obtained from the patient.  The patient was positioned prone on the fluoroscopy table with a pillow under the abdomen.  Blood pressure cuff and oxygen saturation monitors were attached.  The patient was monitored throughout the procedure.  The sacral hiatus was identified with the use of fluoroscopy in the lateral view.  The skin was prepped with chlorhexidine and draped in aseptic fashion.  The skin and subcutaneous tissue were anesthetized using 4 mL of 1 percent lidocaine with a 27-gauge needle.  A 3.5-inch, 22-gauge needle was slowly advanced in the lateral view towards the sacral hiatus.  Once the needle was in position, 0.6 mL of contrast was injected and demonstrated epidural spread.  After negative aspiration, a 6 mL solution containing 2 mL of normal saline and 2 mL of 1 percent lidocaine and 80 mg of Kenalog was injected in increments.  The stylet was reinserted and needle was removed.  After the procedure, the patient's blood pressure, heart rate, oxygen saturation, and VAS were recorded in the chart.     There were no complications. The patient tolerated procedure well and was brought to the recovery room for observation in stable condition with discharge instructions  PLAN OF CARE:  The patient follow up in clinic in 3 weeks.     The patient was advised to contact the [facility] for any of the following.  Fever, chills, or night sweats.  New onset of severe sharp pain.  Any new upper or lower extremity weakness or numbness.  Any questions regarding the procedure.     If unable to contact the [facility], the patient was instructed to go to local emergency room.         This patient's clinical history, exam, AND imaging support radiculopathy AND there is a significant impact on quality of life and function AND the pain has been present for at least 4 weeks AND they have failed to improve with noninvasive conservative care.  This patient had at least 50% pain relief for at least 3 months with the last epidural injection.    This patient's pain is so severe it results in a significant degree of disability. Prior ESI has provided at least a 50% improvement in pain and function for at least 3 months. The patient's Primary Care Physician has been notified of the continuation of this procedure and prolonged repeat steroid use. The patient is not a surgical candidate.      Administrations This Visit     diazePAM (VALIUM) tablet 5 mg     Admin Date  08/14/2022 Action  Given Dose  5 mg Route  Oral Administered By  Lynnell Grain, RN          iohexoL (OMNIPAQUE-240) 240 mg/mL injection 2.5 mL     Admin Date  08/14/2022 Action  Given Dose  2.5 mL Route  Epidural Administered By  [name], MD          triamcinolone acetonide [facility]) injection 80 mg     Admin Date  08/14/2022 Action  Given Dose  80 mg Route  Epidural Administered By  [name], MD              Estimated blood loss: none or minimal  Specimens: none  Patient tolerated the procedure well with no immediate complications. Pressure was applied, and hemostasis was accomplished.

## 2022-08-24 ENCOUNTER — Encounter: Admit: 2022-08-24 | Discharge: 2022-08-24 | Payer: MEDICARE

## 2022-09-03 ENCOUNTER — Encounter: Admit: 2022-09-03 | Discharge: 2022-09-03 | Payer: MEDICARE

## 2022-09-03 ENCOUNTER — Ambulatory Visit: Admit: 2022-09-03 | Discharge: 2022-09-04 | Payer: MEDICARE

## 2022-09-03 DIAGNOSIS — Z8679 Personal history of other diseases of the circulatory system: Secondary | ICD-10-CM

## 2022-09-03 DIAGNOSIS — G5600 Carpal tunnel syndrome, unspecified upper limb: Secondary | ICD-10-CM

## 2022-09-03 DIAGNOSIS — M4802 Spinal stenosis, cervical region: Secondary | ICD-10-CM

## 2022-09-03 DIAGNOSIS — M4800 Spinal stenosis, site unspecified: Secondary | ICD-10-CM

## 2022-09-03 DIAGNOSIS — G959 Disease of spinal cord, unspecified: Secondary | ICD-10-CM

## 2022-09-03 DIAGNOSIS — J439 Emphysema, unspecified: Secondary | ICD-10-CM

## 2022-09-03 DIAGNOSIS — K5900 Constipation, unspecified: Secondary | ICD-10-CM

## 2022-09-03 DIAGNOSIS — F419 Anxiety disorder, unspecified: Secondary | ICD-10-CM

## 2022-09-03 DIAGNOSIS — M5020 Other cervical disc displacement, unspecified cervical region: Secondary | ICD-10-CM

## 2022-09-03 DIAGNOSIS — I8390 Asymptomatic varicose veins of unspecified lower extremity: Secondary | ICD-10-CM

## 2022-09-03 DIAGNOSIS — Z8614 Personal history of Methicillin resistant Staphylococcus aureus infection: Secondary | ICD-10-CM

## 2022-09-03 DIAGNOSIS — R519 Generalized headaches: Secondary | ICD-10-CM

## 2022-09-03 DIAGNOSIS — M2550 Pain in unspecified joint: Secondary | ICD-10-CM

## 2022-09-03 DIAGNOSIS — I10 Essential (primary) hypertension: Secondary | ICD-10-CM

## 2022-09-03 DIAGNOSIS — J45909 Unspecified asthma, uncomplicated: Secondary | ICD-10-CM

## 2022-09-03 DIAGNOSIS — M5416 Radiculopathy, lumbar region: Secondary | ICD-10-CM

## 2022-09-03 DIAGNOSIS — T148XXA Other injury of unspecified body region, initial encounter: Secondary | ICD-10-CM

## 2022-09-03 DIAGNOSIS — I619 Nontraumatic intracerebral hemorrhage, unspecified: Secondary | ICD-10-CM

## 2022-09-03 DIAGNOSIS — Z86010 Personal history of colonic polyps: Secondary | ICD-10-CM

## 2022-09-03 DIAGNOSIS — M797 Fibromyalgia: Secondary | ICD-10-CM

## 2022-09-03 DIAGNOSIS — E785 Hyperlipidemia, unspecified: Secondary | ICD-10-CM

## 2022-09-03 DIAGNOSIS — Z92241 Personal history of systemic steroid therapy: Secondary | ICD-10-CM

## 2022-09-03 DIAGNOSIS — M4316 Spondylolisthesis, lumbar region: Secondary | ICD-10-CM

## 2022-09-03 DIAGNOSIS — K219 Gastro-esophageal reflux disease without esophagitis: Secondary | ICD-10-CM

## 2022-09-03 DIAGNOSIS — J189 Pneumonia, unspecified organism: Secondary | ICD-10-CM

## 2022-09-03 NOTE — Progress Notes
[facility] CLINIC NOTE       SUBJECTIVE:   Kristen Vaughn is a 61 y.o.-year-old female with history of ACDF who presents for follow-up after caudal epidural steroid injection on 08/14/2022. Patient reports significant relief with the injection. Patient reports at least 70% reduction in pain.  She reports this injection provided the most relief that she has had from injections in the low back pain.  She is interested in repeating this when pain worsens or reoccurs.  She is still having some pain in the hamstring but is scheduled to follow-up with Dr. Felicie Morn who performed PRP previously with significant benefit.  She is working on her smoking cessation efforts so she would be a candidate for surgery with Dr. Liam Rogers for her neck.  She continues to take her current medications.  She denies any new numbness or weakness.  VAS pain score today is a 6 out of 10.  Pain is not at preprocedural level at this time.  She continues to perform home exercises on a regular basis. Denies recent falls. Denies any loss of control of bowel or bladder.        Review of Systems    Current Outpatient Medications:     acetaminophen (TYLENOL) 500 mg tablet, Take two tablets by mouth every 6 hours as needed for Pain. Max of 4,000 mg of acetaminophen in 24 hours., Disp: 500 tablet, Rfl: 1    albuterol sulfate (PROAIR HFA) 90 mcg/actuation HFA aerosol inhaler, Inhale two puffs by mouth into the lungs every 6 hours as needed for Wheezing or Shortness of Breath. Shake well before use., Disp: , Rfl:     AMITR/GABAPEN/EMU OIL 12/26/08% CREAM (COMPOUND), Apply 0.5grams to affected area three times daily PRN for pain, Disp: 50 g, Rfl: 3    aspirin EC 81 mg tablet, Take one tablet by mouth daily. Take with food., Disp: , Rfl:     baclofen (LIORESAL) 10 mg tablet, Take one tablet by mouth three times daily., Disp: , Rfl:     calcium carbonate (CALCIUM 600 PO), Take 1 tablet by mouth daily., Disp: , Rfl:     celecoxib (CELEBREX) 200 mg capsule, Take one capsule by mouth daily., Disp: , Rfl:     cetirizine (ZYRTEC) 10 mg tablet, Take one tablet by mouth daily as needed for Allergy symptoms., Disp: , Rfl:     Cholecalciferol (Vitamin D3) (VITAMIN D-3) 2,000 unit cap, Take two capsules by mouth daily. (Patient taking differently: Take one capsule by mouth daily.), Disp: 300 capsule, Rfl: 11    clonazePAM (KLONOPIN) 1 mg tablet, Take one tablet by mouth daily as needed., Disp: , Rfl:     denosumab (PROLIA) 60 mg/mL injection, Inject 1 mL under the skin every 180 days., Disp: , Rfl:     desvenlafaxine succinate (PRISTIQ) 100 mg tablet, Take one tablet by mouth daily., Disp: , Rfl:     diclofenac (VOLTAREN) 1 % topical gel, Apply four g topically to affected area four times daily., Disp: , Rfl:     fish oil- omega 3-DHA/EPA 300/1,000 mg capsule, Take one capsule by mouth daily., Disp: , Rfl:     fluticasone propionate (FLONASE ALLERGY RELIEF) 50 mcg/actuation nasal spray, suspension, Apply two sprays to each nostril as directed daily., Disp: 16 g, Rfl: 3    ketoconazole (NIZORAL) 2 % topical cream, Apply  topically to affected area twice daily., Disp: 60 g, Rfl: 11    lidocaine (LIDODERM) 5 % topical patch, Apply one  patch topically to affected area daily. Apply patch for 12 hours, then remove for 12 hours before repeating., Disp: 90 patch, Rfl: 3    lidocaine/prilocaine (EMLA) 2.5/2.5 % topical cream, APPLY TOPICALLY TO AFFECTED AREA AS NEEDED., Disp: 30 g, Rfl: 11    losartan (COZAAR) 25 mg tablet, TAKE ONE TABLET BY MOUTH DAILY. INDICATIONS: HIGH BLOOD PRESSURE, Disp: 90 tablet, Rfl: 3    mirtazapine (REMERON) 15 mg tablet, Take one tablet by mouth at bedtime daily., Disp: , Rfl:     mupirocin (BACTROBAN) 2 % topical ointment, Apply  topically to affected area twice daily., Disp: 22 g, Rfl: 11    nicotine (polacrilex) (NICORETTE) 4 mg buccal lozenge, Place one lozenge inside cheek (side of mouth) every 1 hour while awake as needed. Place 1 lozenge inside cheek (side of mouth) as needed. May use 1 per hour, not to exceed 20 per day for 12 weeks. May use longer if needed., Disp: 144 lozenge, Rfl: 3    nicotine 21-14-7 mg/24 hr transdermal patch starter kit, Apply one patch to top of skin as directed every 24 hours., Disp: 42 patch, Rfl: 0    omeprazole DR(+) (PRILOSEC) 40 mg capsule, Take one capsule by mouth daily before breakfast., Disp: , Rfl:     pregabalin (LYRICA) 200 mg capsule, Take one capsule by mouth three times daily., Disp: 90 capsule, Rfl: 1    REXULTI 2 mg tablet, Take one tablet by mouth daily., Disp: , Rfl:     rosuvastatin (CRESTOR) 10 mg tablet, TAKE 1 TABLET BY MOUTH DAILY, Disp: 90 tablet, Rfl: 3    triamcinolone acetonide (KENALOG) 0.1 % topical ointment, APPLY TO AFFECTED AREA TWICE A DAY, Disp: 30 g, Rfl: 3    walker (ULTRA-LIGHT ROLLATOR) medical supply, Use as directed., Disp: 1 each, Rfl: 0    Current Facility-Administered Medications:     diazePAM (VALIUM) tablet 5 mg, 5 mg, Oral, PRN, [name], MD, 5 mg at 04/27/19 1429  Allergies   Allergen Reactions    Erythromycin RASH     Rash on trunk    Ascorbic Acid NAUSEA ONLY and UNKNOWN    Strawberry UNKNOWN     Physical Exam  Vitals:    09/03/22 1257   BP: 133/89   BP Source: Arm, Right Upper   Pulse: 66   SpO2: 97%   PainSc: Six   Weight: 110.2 kg (243 lb)   Height: 170.2 cm (5' 7)     Oswestry Total Score:: 74  Pain Score: Six  Body mass index is 38.06 kg/m?Marland Kitchen  General: 61 y.o. female appears stated age, in no acute distress  HEENT: Normocephalic, atraumatic  Neck: No thyroidmegaly  Cardiovascular: Well perfused  Pulmonary: Unlabored respirations  Extremities: No cyanosis, clubbing, or edema  Skin: Warm and dry  Psychiatric:  Appropriate mood and affect  Musculoskeletal: Decreased range of motion with lumbar flexion, extension, and lateral rotation.  Tender to palpation at lumbar facets, gluteal musculature, and greater trochanter bursa. Facet loading is uncomfortable bilaterally. Neurologic: Lower extremity myotomes are all antigravity. Lower extremity dermatomes are all intact to light touch.  Deep tendon reflexes are symmetric at patella and achilles.  Negative slump test bilaterally.  No ankle clonus.        IMPRESSION:  1. Lumbar radiculopathy    2. Spondylolisthesis, lumbar region    3. S/P epidural steroid injection      PLAN:   1.  Lifestyle modifications.  Recommend activity as tolerated.  Avoid  provocative maneuvers.  Keep spine in neutral position.  Continue with smoking cessation efforts.  2.  Medications.  No changes indicated at this time.  Continue medications as previously prescribed.  3.  Therapy.  Continue with home exercise efforts.  4.  Imaging.  None indicated at this time.  5.  Interventions.  I would recommend repeating caudal epidural steroid injection when pain worsens or reoccurs.  We will tentatively schedule her for 3 months from now.  If she is still having relief, she will call and cancel.  6.  Follow-up.  Patient to follow-up for repeat injection in February.

## 2022-09-04 ENCOUNTER — Encounter: Admit: 2022-09-04 | Discharge: 2022-09-04 | Payer: MEDICARE

## 2022-09-05 ENCOUNTER — Encounter: Admit: 2022-09-05 | Discharge: 2022-09-05 | Payer: MEDICARE

## 2022-09-05 DIAGNOSIS — G4736 Sleep related hypoventilation in conditions classified elsewhere: Secondary | ICD-10-CM

## 2022-09-05 DIAGNOSIS — G4733 Obstructive sleep apnea (adult) (pediatric): Secondary | ICD-10-CM

## 2022-09-07 ENCOUNTER — Encounter: Admit: 2022-09-07 | Discharge: 2022-09-07 | Payer: MEDICARE

## 2022-09-10 ENCOUNTER — Encounter: Admit: 2022-09-10 | Discharge: 2022-09-10 | Payer: MEDICARE

## 2022-09-10 DIAGNOSIS — J302 Other seasonal allergic rhinitis: Secondary | ICD-10-CM

## 2022-09-10 MED ORDER — FLUTICASONE PROPIONATE 50 MCG/ACTUATION NA SPSN
2 | Freq: Every day | NASAL | 2 refills | 60.00000 days | Status: AC
Start: 2022-09-10 — End: ?

## 2022-09-10 NOTE — Telephone Encounter
Refill request received for flonase. E-rx sent per protocol.

## 2022-09-18 ENCOUNTER — Encounter: Admit: 2022-09-18 | Discharge: 2022-09-18 | Payer: MEDICARE

## 2022-09-26 ENCOUNTER — Encounter: Admit: 2022-09-26 | Discharge: 2022-09-26 | Payer: MEDICARE

## 2022-09-26 ENCOUNTER — Ambulatory Visit: Admit: 2022-09-26 | Discharge: 2022-09-26 | Payer: MEDICARE

## 2022-09-26 DIAGNOSIS — E785 Hyperlipidemia, unspecified: Secondary | ICD-10-CM

## 2022-09-26 DIAGNOSIS — I8390 Asymptomatic varicose veins of unspecified lower extremity: Secondary | ICD-10-CM

## 2022-09-26 DIAGNOSIS — M5020 Other cervical disc displacement, unspecified cervical region: Secondary | ICD-10-CM

## 2022-09-26 DIAGNOSIS — M4802 Spinal stenosis, cervical region: Secondary | ICD-10-CM

## 2022-09-26 DIAGNOSIS — G5600 Carpal tunnel syndrome, unspecified upper limb: Secondary | ICD-10-CM

## 2022-09-26 DIAGNOSIS — M4800 Spinal stenosis, site unspecified: Secondary | ICD-10-CM

## 2022-09-26 DIAGNOSIS — N62 Hypertrophy of breast: Secondary | ICD-10-CM

## 2022-09-26 DIAGNOSIS — F419 Anxiety disorder, unspecified: Secondary | ICD-10-CM

## 2022-09-26 DIAGNOSIS — N649 Disorder of breast, unspecified: Secondary | ICD-10-CM

## 2022-09-26 DIAGNOSIS — M810 Age-related osteoporosis without current pathological fracture: Secondary | ICD-10-CM

## 2022-09-26 DIAGNOSIS — K219 Gastro-esophageal reflux disease without esophagitis: Secondary | ICD-10-CM

## 2022-09-26 DIAGNOSIS — T7840XA Allergy, unspecified, initial encounter: Secondary | ICD-10-CM

## 2022-09-26 DIAGNOSIS — Z8679 Personal history of other diseases of the circulatory system: Secondary | ICD-10-CM

## 2022-09-26 DIAGNOSIS — T148XXA Other injury of unspecified body region, initial encounter: Secondary | ICD-10-CM

## 2022-09-26 DIAGNOSIS — Z8614 Personal history of Methicillin resistant Staphylococcus aureus infection: Secondary | ICD-10-CM

## 2022-09-26 DIAGNOSIS — I619 Nontraumatic intracerebral hemorrhage, unspecified: Secondary | ICD-10-CM

## 2022-09-26 DIAGNOSIS — K5900 Constipation, unspecified: Secondary | ICD-10-CM

## 2022-09-26 DIAGNOSIS — G959 Disease of spinal cord, unspecified: Secondary | ICD-10-CM

## 2022-09-26 DIAGNOSIS — M2550 Pain in unspecified joint: Secondary | ICD-10-CM

## 2022-09-26 DIAGNOSIS — J439 Emphysema, unspecified: Secondary | ICD-10-CM

## 2022-09-26 DIAGNOSIS — J189 Pneumonia, unspecified organism: Secondary | ICD-10-CM

## 2022-09-26 DIAGNOSIS — M1990 Unspecified osteoarthritis, unspecified site: Secondary | ICD-10-CM

## 2022-09-26 DIAGNOSIS — Z86010 Personal history of colonic polyps: Secondary | ICD-10-CM

## 2022-09-26 DIAGNOSIS — J45909 Unspecified asthma, uncomplicated: Secondary | ICD-10-CM

## 2022-09-26 DIAGNOSIS — R519 Generalized headaches: Secondary | ICD-10-CM

## 2022-09-26 DIAGNOSIS — I10 Essential (primary) hypertension: Secondary | ICD-10-CM

## 2022-09-26 DIAGNOSIS — M797 Fibromyalgia: Secondary | ICD-10-CM

## 2022-09-26 NOTE — Progress Notes
Date of Service: 09/26/2022    Subjective:             Kristen Vaughn is a 62 y.o. female.    History of Present Illness  Patient presents to clinic to discuss symptomatic macromastia. Patient currently wears a 42E cup. Patient would like to be more proportional to body habitus and feels she would be more comfortable at 42B/C. Patient endorses postural changes, hypoesthesia of upper extremities, neck and upper/mid/lower back pain. Patient regularly has indentations of shoulders from brasserie straps. Patient has previously completed PT for her neck and back. Patient takes OTC medication - NSAIDs, lidocaine patches and topical lidocaine for aches and pains.     Allergies   Allergen Reactions    Erythromycin RASH     Rash on trunk    Strawberry RASH    Ascorbic Acid NAUSEA ONLY and UNKNOWN     PMHx: Brain bleed, cervical disc herniation, polyneuropathy, RLS, emphysema, HLD, asthma, arthritis, HTN, fibromyalgia, headaches, GERD, osteoporosis, and depression.  Surgical Hx: Cervical fusion, right trigger finger release, carpal tunnel release, EGD/Colonoscopy, total hysterectomy, lap cholecystectomy, D&C.  Social Hx: Patient is a current 1/2 ppd smoker. She denies drinking alcohol. She has had three pregnancies, two vaginal deliveries and one miscarriage.    Family Hx of breast cancer: No known history.    Patient will be getting an updated mammogram tomorrow.      Review of Systems   Musculoskeletal:  Positive for back pain and neck pain.   All other systems reviewed and are negative.        Objective:          acetaminophen (TYLENOL) 500 mg tablet Take two tablets by mouth every 6 hours as needed for Pain. Max of 4,000 mg of acetaminophen in 24 hours.    albuterol sulfate (PROAIR HFA) 90 mcg/actuation HFA aerosol inhaler Inhale two puffs by mouth into the lungs every 6 hours as needed for Wheezing or Shortness of Breath. Shake well before use.    AMITR/GABAPEN/EMU OIL 12/26/08% CREAM (COMPOUND) Apply 0.5grams to affected area three times daily PRN for pain    aspirin EC 81 mg tablet Take one tablet by mouth daily. Take with food.    baclofen (LIORESAL) 10 mg tablet Take one tablet by mouth three times daily.    calcium carbonate (CALCIUM 600 PO) Take 1 tablet by mouth daily.    celecoxib (CELEBREX) 200 mg capsule Take one capsule by mouth daily.    cetirizine (ZYRTEC) 10 mg tablet Take one tablet by mouth daily as needed for Allergy symptoms.    Cholecalciferol (Vitamin D3) (VITAMIN D-3) 2,000 unit cap Take two capsules by mouth daily. (Patient taking differently: Take one capsule by mouth daily.)    clonazePAM (KLONOPIN) 1 mg tablet Take one tablet by mouth daily as needed.    denosumab (PROLIA) 60 mg/mL injection Inject 1 mL under the skin every 180 days.    desvenlafaxine succinate (PRISTIQ) 100 mg tablet Take one tablet by mouth daily.    diclofenac (VOLTAREN) 1 % topical gel Apply four g topically to affected area four times daily.    fish oil- omega 3-DHA/EPA 300/1,000 mg capsule Take one capsule by mouth daily.    fluticasone propionate (FLONASE ALLERGY RELIEF) 50 mcg/actuation nasal spray, suspension Apply two sprays to each nostril as directed daily.    ketoconazole (NIZORAL) 2 % topical cream Apply  topically to affected area twice daily.    lidocaine (LIDODERM) 5 % topical  patch Apply one patch topically to affected area daily. Apply patch for 12 hours, then remove for 12 hours before repeating.    lidocaine/prilocaine (EMLA) 2.5/2.5 % topical cream APPLY TOPICALLY TO AFFECTED AREA AS NEEDED.    lisinopriL (ZESTRIL) 40 mg tablet Take one tablet by mouth daily. for high blood pressure    mirtazapine (REMERON) 15 mg tablet Take one tablet by mouth at bedtime daily.    mupirocin (BACTROBAN) 2 % topical ointment Apply  topically to affected area twice daily.    nicotine 21-14-7 mg/24 hr transdermal patch starter kit Apply one patch to top of skin as directed every 24 hours.    omeprazole DR(+) (PRILOSEC) 40 mg capsule Take one capsule by mouth daily before breakfast.    pregabalin (LYRICA) 200 mg capsule Take one capsule by mouth three times daily.    REXULTI 2 mg tablet Take one tablet by mouth daily.    rosuvastatin (CRESTOR) 10 mg tablet TAKE 1 TABLET BY MOUTH DAILY    triamcinolone acetonide (KENALOG) 0.1 % topical ointment APPLY TO AFFECTED AREA TWICE A DAY    walker (ULTRA-LIGHT ROLLATOR) medical supply Use as directed.     Vitals:    09/26/22 1007   BP: 135/78   Pulse: 66   Weight: 112 kg (247 lb)   Height: 170.2 cm (5' 7)     Body mass index is 38.69 kg/m?Marland Kitchen     Physical Exam  Vitals reviewed.   Constitutional:       Appearance: Normal appearance. She is well-groomed.   HENT:      Head: Normocephalic.   Pulmonary:      Effort: Pulmonary effort is normal.   Chest:       Skin:     General: Skin is warm and dry.   Neurological:      Mental Status: She is alert.   Psychiatric:         Mood and Affect: Mood normal.         Behavior: Behavior normal. Behavior is cooperative.         Thought Content: Thought content normal.         Judgment: Judgment normal.        Assessment and Plan:  62 y/o female patient with symptomatic macromastia.      - Discussed with the patient that based on her current BSA of 2.30 will need to remove 1068g/side. We discussed that this is larger than her current breast volume. We discussed weight loss to approximately 210lbs to bring her BMI closer to 35.     - I went through the breast reduction information packet discussing breast cup size is not standard and I cannot predict cup size after surgery.  Insurance requires a specific amount of breast weight (in grams) be removed base on body surface area. Additionally, they prefer failed conservative treatments including but not limited to: weight loss, treatments by PCP, prescription and OTC meds, PT, chiropractor, personal training, massage therapy, etc.    - Incisions- discussed and showed examples of incisions for breast reduction which are typically wise pattern or anchor incisions that can sometimes extend into flanks and sometime meet in the center of chest to correct synmastia. Discussed it is normal to have some breakdown/scabs/wounds on incisions where incisions meet- at I circled those areas on the packet drawing of incisions.    - Activity- We discussed activity restrictions after surgery including 6 weeks of not lifting >10lbs and no strenuous.   -  Talitha Givens- will need to wear a non-under wire bra, preferably with front closure day/night except in shower for at least 6 weeks, with padding along IMF, will need to cont to wear non-under wire bra for 1mo to allow healing.  Swelling persists for approximately 1mo.  Thus recommend waiting 3 mo before bra shopping and getting sized.  Will be able to wear any bra as comfortable after that time.    -I discussed the risks, benefits, alternatives, and limitations of surgery. We spoke about bleeding, infection, incision separation, fat necrosis, loss of her nipple and areola, altered coloration of her nipple and areola, altered sensation of her nipple and areola, seroma formation, scarring, asymmetry, unacceptable cosmesis with potential boxy shape. She needs support after the surgery as I do not want her to lift any heavy objects or do strenuous activity for 4-6 weeks. We discussed that her impaired mobility may cause pain postoperatively as she currently ambulates with a cane or walker.      - I discussed with her the process of submitting to insurance for approval   - I informed her to make sure her insurance co doesn't have a clause stating breast reduction is not a covered benefit.   - discussed that if prior authorization is denied, it might be mean that it is not a covered benefit, that more conservative treatments might be required or that not enough tissue needs to be removed.    - sometimes suggest that her PCP prescribe  PT for neck, back, shoulder pain related to her macromastia, see other practitioners like chiropractor and/or additional topical treatments for intertrigo.  If these do not help her symptoms, we can re-submit if/when it fails.   - can also give her a quote for cosmetic pricing if not covered after all attempts.     - needs current MMG prior to surgery (ordered by PCP) and any other suggested imaging or work up completed prior to preop, will need early MMG if surgery is done w/I 1mo of due date for yearly mammagrams.     Need to have copy of results at the time of their preop appt. Then recommend post op 37mo as a baseline for future surgeries.  If not done, will be canceled/rescheduled.    - all of AVAIYA SAWINSKI questions were answered to their satisfaction and she expressed understanding.     -Discussed with patient that she must stop smoking prior to any elective procedures as it puts her at a much higher risk for complications and wound healing issues. Patient notes that she will need a revision of her cervical fusion once she stops smoking. We discussed that she will need to lose weight - achieve a BMI around 35 to determine if she has enough breast tissue required for insurance covered. Patient is in agreement with plan.    I encouraged patient to reach out when she stops smoking, has cervical fusion revision and meets her goal weight.     The diagnosis codes I would use include:  N62 Symptomatic macromastia  M54.2 Neck Pain  M54.9 Upper Back Pain  M25.519Shoulder Pain  L30.4 Intertrigo, infections under the breast    The CPT codes for this surgery are 16109, 19318.50     Hanley Hays, APRN-NP

## 2022-10-16 ENCOUNTER — Encounter: Admit: 2022-10-16 | Discharge: 2022-10-16 | Payer: MEDICARE

## 2022-10-16 DIAGNOSIS — M4316 Spondylolisthesis, lumbar region: Secondary | ICD-10-CM

## 2022-10-16 DIAGNOSIS — M545 Acute exacerbation of chronic low back pain: Secondary | ICD-10-CM

## 2022-10-16 DIAGNOSIS — M5416 Radiculopathy, lumbar region: Secondary | ICD-10-CM

## 2022-10-16 MED ORDER — PREGABALIN 200 MG PO CAP
200 mg | ORAL_CAPSULE | Freq: Three times a day (TID) | ORAL | 1 refills
Start: 2022-10-16 — End: ?

## 2022-10-16 NOTE — Telephone Encounter
Patient called and left VM stating her back pain has returned. She had minimal relief following her injection in November and would like to know what she needs to do next.    RN called and left VM with patient stating her best option is to call [phone] and schedule another clinic visit with Brett Fairy since she did not receive much relief from her last injection.  They need to discuss next steps prior to her injection scheduled in February.  RN also told patient she can call back or send a mychart message with further questions or concerns.    Pt then called back and left vm stating she will be out of her pregabalin tomorrow.  RN will send refill request to provider and send mychart message to patient stating it has been sent but can take up to 3 days to be approved.

## 2022-10-16 NOTE — Telephone Encounter
Patient called and left VM regarding needing pregabalin filled.    RN called patient back and told her that it has been sent to our providers for approval but I cannot guarantee they will get to it today. Patient states if she goes without it for 4 days she will have seizures.  RN reports she will notify providers and ask them to sign off tomorrow.  RN also scheduled pt appt to see [name] regarding injection not working.

## 2022-10-18 ENCOUNTER — Encounter: Admit: 2022-10-18 | Discharge: 2022-10-18 | Payer: MEDICARE

## 2022-10-18 NOTE — Telephone Encounter
Pt called and left  VM inquiring about her Lyrica.  Pt states she has reached out earlier this week and that the medication has not been filled.  Pt states that she gets seizures when she does not have her medication.    This RN called patient and let her know that the script has been filled by the [facility] and should be available at her local pharmacy.  Pt expresses understanding.

## 2022-10-24 ENCOUNTER — Encounter: Admit: 2022-10-24 | Discharge: 2022-10-24 | Payer: MEDICARE

## 2022-10-24 ENCOUNTER — Ambulatory Visit: Admit: 2022-10-24 | Discharge: 2022-10-25 | Payer: MEDICARE

## 2022-10-24 DIAGNOSIS — E785 Hyperlipidemia, unspecified: Secondary | ICD-10-CM

## 2022-10-24 DIAGNOSIS — J45909 Unspecified asthma, uncomplicated: Secondary | ICD-10-CM

## 2022-10-24 DIAGNOSIS — N649 Disorder of breast, unspecified: Secondary | ICD-10-CM

## 2022-10-24 DIAGNOSIS — Z8614 Personal history of Methicillin resistant Staphylococcus aureus infection: Secondary | ICD-10-CM

## 2022-10-24 DIAGNOSIS — T7840XA Allergy, unspecified, initial encounter: Secondary | ICD-10-CM

## 2022-10-24 DIAGNOSIS — J189 Pneumonia, unspecified organism: Secondary | ICD-10-CM

## 2022-10-24 DIAGNOSIS — I10 Essential (primary) hypertension: Secondary | ICD-10-CM

## 2022-10-24 DIAGNOSIS — M4802 Spinal stenosis, cervical region: Secondary | ICD-10-CM

## 2022-10-24 DIAGNOSIS — F419 Anxiety disorder, unspecified: Secondary | ICD-10-CM

## 2022-10-24 DIAGNOSIS — J439 Emphysema, unspecified: Secondary | ICD-10-CM

## 2022-10-24 DIAGNOSIS — M629 Disorder of muscle, unspecified: Secondary | ICD-10-CM

## 2022-10-24 DIAGNOSIS — M5020 Other cervical disc displacement, unspecified cervical region: Secondary | ICD-10-CM

## 2022-10-24 DIAGNOSIS — G5600 Carpal tunnel syndrome, unspecified upper limb: Secondary | ICD-10-CM

## 2022-10-24 DIAGNOSIS — I8390 Asymptomatic varicose veins of unspecified lower extremity: Secondary | ICD-10-CM

## 2022-10-24 DIAGNOSIS — K5900 Constipation, unspecified: Secondary | ICD-10-CM

## 2022-10-24 DIAGNOSIS — M810 Age-related osteoporosis without current pathological fracture: Secondary | ICD-10-CM

## 2022-10-24 DIAGNOSIS — M1990 Unspecified osteoarthritis, unspecified site: Secondary | ICD-10-CM

## 2022-10-24 DIAGNOSIS — G959 Disease of spinal cord, unspecified: Secondary | ICD-10-CM

## 2022-10-24 DIAGNOSIS — R519 Generalized headaches: Secondary | ICD-10-CM

## 2022-10-24 DIAGNOSIS — T148XXA Other injury of unspecified body region, initial encounter: Secondary | ICD-10-CM

## 2022-10-24 DIAGNOSIS — I619 Nontraumatic intracerebral hemorrhage, unspecified: Secondary | ICD-10-CM

## 2022-10-24 DIAGNOSIS — M2550 Pain in unspecified joint: Secondary | ICD-10-CM

## 2022-10-24 DIAGNOSIS — G971 Other reaction to spinal and lumbar puncture: Secondary | ICD-10-CM

## 2022-10-24 DIAGNOSIS — M797 Fibromyalgia: Secondary | ICD-10-CM

## 2022-10-24 DIAGNOSIS — M5030 Other cervical disc degeneration, unspecified cervical region: Secondary | ICD-10-CM

## 2022-10-24 DIAGNOSIS — K219 Gastro-esophageal reflux disease without esophagitis: Secondary | ICD-10-CM

## 2022-10-24 DIAGNOSIS — Z86010 Personal history of colonic polyps: Secondary | ICD-10-CM

## 2022-10-24 DIAGNOSIS — M4800 Spinal stenosis, site unspecified: Secondary | ICD-10-CM

## 2022-10-24 DIAGNOSIS — Z8679 Personal history of other diseases of the circulatory system: Secondary | ICD-10-CM

## 2022-10-24 MED ORDER — RXAMB NALTREXONE 1 MG/ML ORAL SUSPENSION (BATCHED COMPOUND)
1 refills | Status: CN
Start: 2022-10-24 — End: ?

## 2022-10-25 ENCOUNTER — Encounter: Admit: 2022-10-25 | Discharge: 2022-10-25 | Payer: MEDICARE

## 2022-10-26 ENCOUNTER — Encounter: Admit: 2022-10-26 | Discharge: 2022-10-26 | Payer: MEDICARE

## 2022-10-26 ENCOUNTER — Ambulatory Visit: Admit: 2022-10-26 | Discharge: 2022-10-27 | Payer: MEDICARE

## 2022-10-26 DIAGNOSIS — R0683 Snoring: Secondary | ICD-10-CM

## 2022-10-26 DIAGNOSIS — M797 Fibromyalgia: Secondary | ICD-10-CM

## 2022-10-26 DIAGNOSIS — M4800 Spinal stenosis, site unspecified: Secondary | ICD-10-CM

## 2022-10-26 DIAGNOSIS — E785 Hyperlipidemia, unspecified: Secondary | ICD-10-CM

## 2022-10-26 DIAGNOSIS — T148XXA Other injury of unspecified body region, initial encounter: Secondary | ICD-10-CM

## 2022-10-26 DIAGNOSIS — J45909 Unspecified asthma, uncomplicated: Secondary | ICD-10-CM

## 2022-10-26 DIAGNOSIS — J439 Emphysema, unspecified: Secondary | ICD-10-CM

## 2022-10-26 DIAGNOSIS — G5600 Carpal tunnel syndrome, unspecified upper limb: Secondary | ICD-10-CM

## 2022-10-26 DIAGNOSIS — M5030 Other cervical disc degeneration, unspecified cervical region: Secondary | ICD-10-CM

## 2022-10-26 DIAGNOSIS — G4733 Obstructive sleep apnea (adult) (pediatric): Secondary | ICD-10-CM

## 2022-10-26 DIAGNOSIS — M629 Disorder of muscle, unspecified: Secondary | ICD-10-CM

## 2022-10-26 DIAGNOSIS — R519 Generalized headaches: Secondary | ICD-10-CM

## 2022-10-26 DIAGNOSIS — I619 Nontraumatic intracerebral hemorrhage, unspecified: Secondary | ICD-10-CM

## 2022-10-26 DIAGNOSIS — Z8614 Personal history of Methicillin resistant Staphylococcus aureus infection: Secondary | ICD-10-CM

## 2022-10-26 DIAGNOSIS — J302 Other seasonal allergic rhinitis: Secondary | ICD-10-CM

## 2022-10-26 DIAGNOSIS — G959 Disease of spinal cord, unspecified: Secondary | ICD-10-CM

## 2022-10-26 DIAGNOSIS — Z86010 Personal history of colonic polyps: Secondary | ICD-10-CM

## 2022-10-26 DIAGNOSIS — I8390 Asymptomatic varicose veins of unspecified lower extremity: Secondary | ICD-10-CM

## 2022-10-26 DIAGNOSIS — M810 Age-related osteoporosis without current pathological fracture: Secondary | ICD-10-CM

## 2022-10-26 DIAGNOSIS — J4530 Mild persistent asthma, uncomplicated: Secondary | ICD-10-CM

## 2022-10-26 DIAGNOSIS — I10 Essential (primary) hypertension: Secondary | ICD-10-CM

## 2022-10-26 DIAGNOSIS — N649 Disorder of breast, unspecified: Secondary | ICD-10-CM

## 2022-10-26 DIAGNOSIS — K5900 Constipation, unspecified: Secondary | ICD-10-CM

## 2022-10-26 DIAGNOSIS — M1990 Unspecified osteoarthritis, unspecified site: Secondary | ICD-10-CM

## 2022-10-26 DIAGNOSIS — J189 Pneumonia, unspecified organism: Secondary | ICD-10-CM

## 2022-10-26 DIAGNOSIS — Z8679 Personal history of other diseases of the circulatory system: Secondary | ICD-10-CM

## 2022-10-26 DIAGNOSIS — G971 Other reaction to spinal and lumbar puncture: Secondary | ICD-10-CM

## 2022-10-26 DIAGNOSIS — M5020 Other cervical disc displacement, unspecified cervical region: Secondary | ICD-10-CM

## 2022-10-26 DIAGNOSIS — M4802 Spinal stenosis, cervical region: Secondary | ICD-10-CM

## 2022-10-26 DIAGNOSIS — K219 Gastro-esophageal reflux disease without esophagitis: Secondary | ICD-10-CM

## 2022-10-26 DIAGNOSIS — T7840XA Allergy, unspecified, initial encounter: Secondary | ICD-10-CM

## 2022-10-26 DIAGNOSIS — F419 Anxiety disorder, unspecified: Secondary | ICD-10-CM

## 2022-10-26 DIAGNOSIS — M2550 Pain in unspecified joint: Secondary | ICD-10-CM

## 2022-10-26 MED ORDER — NICOTROL NS 10 MG/ML NA SPRY
RESPIRATORY_TRACT | 3 refills | 28.00000 days | Status: AC
Start: 2022-10-26 — End: ?

## 2022-10-26 NOTE — Patient Instructions
My nurse is Gladys Juarez, RN.  She can be reached at [phone].    Please contact my nurse with any signs and symptoms of worsening productive cough with thick secretions, blood in sputum, chest tightness/pain, shortness of breath, fever, chills, night sweats, or any questions or concerns.    For refills on medications, please have your pharmacy fax a refill authorization request form to our office at [phone]. Please allow at least 3 business days for refill requests.    For urgent issues after business hours/weekends/holidays call [phone] and request for the pulmonary fellow to be paged.

## 2022-10-26 NOTE — Assessment & Plan Note
-   reports historical diagnosis of adult onset asthma with positive family history of asthma in sister  - continue Wixela twice daily  - discussed optimization of allergy therapy  - discussed importance of smoking cessation in setting of Asthma

## 2022-10-26 NOTE — Assessment & Plan Note
-   due for follow-up with sleep team  - recent HST; requesting results  - will reach out to Dr. Ocie Bob for follow-up

## 2022-10-26 NOTE — Progress Notes
Date of Service: 10/26/2022    Subjective:             Kristen Vaughn is a 62 y.o. female  smoker who presents today for follow-up on shortness of breath and cough in the setting of normal PFT.    History of Present Illness  Reports increase in wheezing. Using [place] daily, forgets her evening dose. Using Albuterol once daily with improvement with wheeze. Only occasional shortness of breath. Allergies flaring with congestion and drainage. Taking Claritin and Flonase.   Reports historical diagnosis of Asthma (adult onset) through her PCP. Family history of asthma in a sister. Notes sulfur and matches are most notable triggers. Symptoms worse during allergy season. Increase in wheezing for a couple days. Wheezing clears with cough.   Continues to smoke 1/2 ppd and very motivated to quit to qualify for neck surgery. Patches made her nauseated. Does not like the lozenges or gums. Interested in alternatives.        Review of Systems   Constitutional:  Positive for fatigue and unexpected weight change.   HENT:  Positive for tinnitus.    Respiratory:  Positive for cough and wheezing.    Gastrointestinal:  Positive for diarrhea.   Genitourinary:  Positive for flank pain.   Musculoskeletal:  Positive for arthralgias, back pain, gait problem, myalgias, neck pain and neck stiffness.   Neurological:  Positive for dizziness and light-headedness.   Psychiatric/Behavioral:  Positive for confusion. The patient is nervous/anxious.    All other systems reviewed and are negative.        Objective:          acetaminophen (TYLENOL) 500 mg tablet Take two tablets by mouth every 6 hours as needed for Pain. Max of 4,000 mg of acetaminophen in 24 hours.    albuterol sulfate (PROAIR HFA) 90 mcg/actuation HFA aerosol inhaler Inhale two puffs by mouth into the lungs every 6 hours as needed for Wheezing or Shortness of Breath. Shake well before use.    AMITR/GABAPEN/EMU OIL 12/26/08% CREAM (COMPOUND) Apply 0.5grams to affected area three times daily PRN for pain    aspirin EC 81 mg tablet Take one tablet by mouth daily. Take with food.    baclofen (LIORESAL) 10 mg tablet Take one tablet by mouth three times daily.    calcium carbonate (CALCIUM 600 PO) Take 1 tablet by mouth daily.    celecoxib (CELEBREX) 200 mg capsule Take one capsule by mouth daily.    cetirizine (ZYRTEC) 10 mg tablet Take one tablet by mouth daily as needed for Allergy symptoms.    Cholecalciferol (Vitamin D3) (VITAMIN D-3) 2,000 unit cap Take two capsules by mouth daily. (Patient taking differently: Take one capsule by mouth daily.)    clonazePAM (KLONOPIN) 1 mg tablet Take one tablet by mouth daily as needed.    denosumab (PROLIA) 60 mg/mL injection Inject 1 mL under the skin every 180 days.    desvenlafaxine succinate (PRISTIQ) 100 mg tablet Take one tablet by mouth daily.    diclofenac (VOLTAREN) 1 % topical gel Apply four g topically to affected area four times daily.    fish oil- omega 3-DHA/EPA 300/1,000 mg capsule Take one capsule by mouth daily.    fluticasone propionate (FLONASE ALLERGY RELIEF) 50 mcg/actuation nasal spray, suspension Apply two sprays to each nostril as directed daily.    ketoconazole (NIZORAL) 2 % topical cream Apply  topically to affected area twice daily.    lidocaine (LIDODERM) 5 % topical patch Apply one  patch topically to affected area daily. Apply patch for 12 hours, then remove for 12 hours before repeating.    lidocaine/prilocaine (EMLA) 2.5/2.5 % topical cream APPLY TOPICALLY TO AFFECTED AREA AS NEEDED.    lisinopriL (ZESTRIL) 40 mg tablet Take one tablet by mouth daily. for high blood pressure    mirtazapine (REMERON) 15 mg tablet Take one tablet by mouth at bedtime daily.    mupirocin (BACTROBAN) 2 % topical ointment Apply  topically to affected area twice daily.    naltrexone 1 mg/mL oral suspension (BATCHED COMPOUND) Take 1.64ml by mouth daily for 2 weeks, then 3ml daily for 2 weeks, then 4.24ml daily for 2 weeks.    nicotine (NICOTROL NS) 10 mg/mL nasal spray Use 1 spray in each nostril 1-2 times per hour, not to exceed 40 times per day, for up to 12 weeks.    omeprazole DR(+) (PRILOSEC) 40 mg capsule Take one capsule by mouth daily before breakfast.    pregabalin (LYRICA) 200 mg capsule TAKE 1 CAPSULE BY MOUTH THREE TIMES A DAY    REXULTI 2 mg tablet Take one tablet by mouth daily.    rosuvastatin (CRESTOR) 10 mg tablet TAKE 1 TABLET BY MOUTH DAILY    triamcinolone acetonide (KENALOG) 0.1 % topical ointment APPLY TO AFFECTED AREA TWICE A DAY    VYVANSE 50 mg capsule TAKE 1 CAPSULE ORALLY DAILY    walker (ULTRA-LIGHT ROLLATOR) medical supply Use as directed.     Vitals:    10/26/22 1009   BP: 115/71   Pulse: 64   Temp: 36.3 ?C (97.3 ?F)   Resp: 16   SpO2: 95%   TempSrc: Temporal   PainSc: Seven   Weight: 108.9 kg (240 lb)   Height: 170.2 cm (5' 7)     Body mass index is 37.59 kg/m?Marland Kitchen     Physical Exam  Vitals reviewed.   Constitutional:       Appearance: She is obese.   HENT:      Head: Normocephalic.      Mouth/Throat:      Mouth: Mucous membranes are dry.   Eyes:      Pupils: Pupils are equal, round, and reactive to light.   Cardiovascular:      Rate and Rhythm: Normal rate and regular rhythm.      Heart sounds: Normal heart sounds.   Pulmonary:      Effort: Pulmonary effort is normal.      Breath sounds: Stridor (clears with cough) present. No wheezing or rales.   Musculoskeletal:      Cervical back: Neck supple.   Skin:     General: Skin is warm and dry.   Neurological:      General: No focal deficit present.      Mental Status: She is alert and oriented to person, place, and time.   Psychiatric:         Mood and Affect: Mood normal.         Behavior: Behavior normal.         Thought Content: Thought content normal.         Judgment: Judgment normal.              Assessment and Plan:    Problem   Mild Persistent Asthma Without Complication    Historical dx per PCP  Wixela   albuterol     Snoring   Seasonal Allergies   Tobacco Abuse    Smoked 1/2 to 1 pack cigarettes  daily for 40 years.  Stopped (almost) late October 2019          Snoring  - due for follow-up with sleep team  - recent HST; requesting results  - will reach out to Dr. Oneida Arenas for follow-up    Tobacco abuse  - continues to smoke 1/2 ppd  - unable to tolerate nicotine patches due to nausea and vomiting  - discussed alternatives and will trial nicotine nasal spray    Seasonal allergies  - increase in allergy symptoms with associated inspiratory wheeze that clears with cough/throat clearing consistent with drainage  - recommend switch to Zyrtec  - continue Flonase daily    Mild persistent asthma without complication  - reports historical diagnosis of adult onset asthma with positive family history of asthma in sister  - continue Wixela twice daily  - discussed optimization of allergy therapy  - discussed importance of smoking cessation in setting of Asthma    RTC 6 mo  Anselm Lis, APRN-NP    Orders Placed This Encounter    nicotine (NICOTROL NS) 10 mg/mL nasal spray     Future Appointments   Date Time Provider Department Center   11/19/2022 12:30 PM [name], MD [facility] None

## 2022-10-26 NOTE — Assessment & Plan Note
-   continues to smoke 1/2 ppd  - unable to tolerate nicotine patches due to nausea and vomiting  - discussed alternatives and will trial nicotine nasal spray

## 2022-10-26 NOTE — Assessment & Plan Note
-   increase in allergy symptoms with associated inspiratory wheeze that clears with cough/throat clearing consistent with drainage  - recommend switch to Zyrtec  - continue Flonase daily

## 2022-10-26 NOTE — Telephone Encounter
Pt agrees with new PAP  and [place] order from Dr. Noreene Filbert. Pt provided scheduling contact info to schedule compliance visit. Discussed insurance compliance requirements of 4 or more hours of usage, 70% of the time within a 30 day window in the first 90 days of treatment as well as an office visit in that timeframe, pt voiced understanding.

## 2022-10-27 DIAGNOSIS — Z720 Tobacco use: Secondary | ICD-10-CM

## 2022-10-27 MED FILL — RXAMB NALTREXONE 1 MG/ML ORAL SUSPENSION (BATCHED COMPOUND): 30 days supply | Qty: 0.15 | Fill #1 | Status: AC

## 2022-10-29 ENCOUNTER — Encounter: Admit: 2022-10-29 | Discharge: 2022-10-29 | Payer: MEDICARE

## 2022-10-29 NOTE — Telephone Encounter
Order for     1.AutoPAP 5-15cm H2O   2. Noc Ox on aPAP 5-15 cmH2O 2 weeks after set up  faxed     DME: [facility]  T: [phone]  [phone]    Will continue to follow up.

## 2022-11-13 ENCOUNTER — Encounter: Admit: 2022-11-13 | Discharge: 2022-11-13 | Payer: MEDICARE

## 2022-11-19 ENCOUNTER — Ambulatory Visit: Admit: 2022-11-19 | Discharge: 2022-11-19 | Payer: MEDICARE

## 2022-11-19 ENCOUNTER — Encounter: Admit: 2022-11-19 | Discharge: 2022-11-19 | Payer: MEDICARE

## 2022-11-19 DIAGNOSIS — T7840XA Allergy, unspecified, initial encounter: Secondary | ICD-10-CM

## 2022-11-19 DIAGNOSIS — G959 Disease of spinal cord, unspecified: Secondary | ICD-10-CM

## 2022-11-19 DIAGNOSIS — M629 Disorder of muscle, unspecified: Secondary | ICD-10-CM

## 2022-11-19 DIAGNOSIS — E785 Hyperlipidemia, unspecified: Secondary | ICD-10-CM

## 2022-11-19 DIAGNOSIS — I619 Nontraumatic intracerebral hemorrhage, unspecified: Secondary | ICD-10-CM

## 2022-11-19 DIAGNOSIS — M5030 Other cervical disc degeneration, unspecified cervical region: Secondary | ICD-10-CM

## 2022-11-19 DIAGNOSIS — M5020 Other cervical disc displacement, unspecified cervical region: Secondary | ICD-10-CM

## 2022-11-19 DIAGNOSIS — M4800 Spinal stenosis, site unspecified: Secondary | ICD-10-CM

## 2022-11-19 DIAGNOSIS — K219 Gastro-esophageal reflux disease without esophagitis: Secondary | ICD-10-CM

## 2022-11-19 DIAGNOSIS — M797 Fibromyalgia: Secondary | ICD-10-CM

## 2022-11-19 DIAGNOSIS — G971 Other reaction to spinal and lumbar puncture: Secondary | ICD-10-CM

## 2022-11-19 DIAGNOSIS — M5416 Radiculopathy, lumbar region: Secondary | ICD-10-CM

## 2022-11-19 DIAGNOSIS — M2550 Pain in unspecified joint: Secondary | ICD-10-CM

## 2022-11-19 DIAGNOSIS — Z86010 Personal history of colonic polyps: Secondary | ICD-10-CM

## 2022-11-19 DIAGNOSIS — J189 Pneumonia, unspecified organism: Secondary | ICD-10-CM

## 2022-11-19 DIAGNOSIS — I10 Essential (primary) hypertension: Secondary | ICD-10-CM

## 2022-11-19 DIAGNOSIS — G5600 Carpal tunnel syndrome, unspecified upper limb: Secondary | ICD-10-CM

## 2022-11-19 DIAGNOSIS — I8390 Asymptomatic varicose veins of unspecified lower extremity: Secondary | ICD-10-CM

## 2022-11-19 DIAGNOSIS — F419 Anxiety disorder, unspecified: Secondary | ICD-10-CM

## 2022-11-19 DIAGNOSIS — K5900 Constipation, unspecified: Secondary | ICD-10-CM

## 2022-11-19 DIAGNOSIS — M4802 Spinal stenosis, cervical region: Secondary | ICD-10-CM

## 2022-11-19 DIAGNOSIS — M1990 Unspecified osteoarthritis, unspecified site: Secondary | ICD-10-CM

## 2022-11-19 DIAGNOSIS — T148XXA Other injury of unspecified body region, initial encounter: Secondary | ICD-10-CM

## 2022-11-19 DIAGNOSIS — Z8614 Personal history of Methicillin resistant Staphylococcus aureus infection: Secondary | ICD-10-CM

## 2022-11-19 DIAGNOSIS — N649 Disorder of breast, unspecified: Secondary | ICD-10-CM

## 2022-11-19 DIAGNOSIS — R519 Generalized headaches: Secondary | ICD-10-CM

## 2022-11-19 DIAGNOSIS — Z8679 Personal history of other diseases of the circulatory system: Secondary | ICD-10-CM

## 2022-11-19 DIAGNOSIS — M810 Age-related osteoporosis without current pathological fracture: Secondary | ICD-10-CM

## 2022-11-19 DIAGNOSIS — J439 Emphysema, unspecified: Secondary | ICD-10-CM

## 2022-11-19 DIAGNOSIS — J45909 Unspecified asthma, uncomplicated: Secondary | ICD-10-CM

## 2022-11-19 MED ORDER — LIDOCAINE (PF) 10 MG/ML (1 %) IJ SOLN
5 mL | Freq: Once | INTRAMUSCULAR | 0 refills | Status: CP
Start: 2022-11-19 — End: ?

## 2022-11-19 MED ORDER — TRIAMCINOLONE ACETONIDE 40 MG/ML IJ SUSP
80 mg | Freq: Once | EPIDURAL | 0 refills | Status: CP
Start: 2022-11-19 — End: ?

## 2022-11-19 MED ORDER — SODIUM CHLORIDE 0.9 % IJ SOLN
3 mL | Freq: Once | INTRAMUSCULAR | 0 refills | Status: CP
Start: 2022-11-19 — End: ?

## 2022-11-19 MED ORDER — IOHEXOL 300 MG IODINE/ML IV SOLN
1 mL | Freq: Once | 0 refills | Status: CP
Start: 2022-11-19 — End: ?

## 2022-11-19 NOTE — Progress Notes
[facility]  INTERVENTIONAL PAIN PROCEDURE HISTORY AND PHYSICAL      Chief Complaint: Pain    HISTORY OF PRESENT ILLNESS:    Kristen Vaughn is a 62 y.o. year old female who presents for for interventional treatment of radicular pain. Patient denies any recent fevers, chills, infection, antibiotics, coagulopathy or contra-indicated anticoagulants. Risks of the procedure were discussed including but not limited to bleeding, infection, damage to surrounding structures (including muscles, blood vessels, nerve roots, spinal cord)  and reaction to medications. Patient reports understanding and has elected to proceed with the procedure.  This patient's clinical history, exam, AND imaging support radiculopathy AND there is a significant impact on quality of life and function AND their pain score has been documented in this note AND the pain has been present for at least 4 weeks AND they have failed to improve with noninvasive conservative care. This patient had at least 50% pain relief for at least 3 months with the last epidural injection.      Medical History:   Diagnosis Date    Allergy     Anxiety and depression     Arthritis     Asthma     Brain bleed [facility]) 2023    [facility], [place] well    Breast disorder     Carpal tunnel syndrome     Cervical myelopathy (HCC)     Cervical stenosis of spine     Chest pain     Constipation     Degenerative disc disease, cervical     Essential hypertension     Fibromyalgia     Generalized headaches     GERD (gastroesophageal reflux disease)     Herniated disc, cervical     History of abnormal electrocardiogram 01/2016    History of colon polyps 2015    History of MRSA infection 2019    right hand/[facility]    Hyperlipemia 08/09/2009    Joint pain     Muscle disease or syndrome     Fibromyalgia    Nerve injury     Osteoporosis     Pneumonia     Pulmonary emphysema (HCC)     Spinal headache     Spinal stenosis     Varicose veins        Surgical History:   Procedure Laterality Date    HX HYSTERECTOMY  1985    UPPER GASTROINTESTINAL ENDOSCOPY  2015    COLONOSCOPY  2015    hx colon polyps x2    CARDIOVASCULAR STRESS TEST  01/2016    FINGER TRIGGER RELEASE Right 2019    CARPAL TUNNEL RELEASE Right 2019    ANTERIOR CERVICAL DECOMPRESSION AND FUSION AT CERVICAL 6-7 N/A 10/28/2018    Performed by Criss Rosales, MD at [facility] OR    ARTHRODESIS SPINE ANTERIOR INTERBODY WITH DISCECTOMY/ OSTEOPHYTECTOMY/ DECOMPRESSION - CERVICAL BELOW C2 - EACH ADDITIONAL INTERSPACE N/A 10/28/2018    Performed by Criss Rosales, MD at [facility] OR    ANTERIOR INSTRUMENTATION - 2 TO 3 VERTEBRAL SEGMENTS N/A 10/28/2018    Performed by Criss Rosales, MD at [facility] OR    ABDOMEN SURGERY      DILATION AND CURETTAGE  1980's    HX TUBAL LIGATION      LAP CHOLECYSTECTOMY      SPINE SURGERY         family history includes Alcohol liver disease in her brother; Arthritis in her father; Asthma in her sister; Back pain  in her brother; Cancer in her sister, sister and another family member; Coronary Artery Disease in her father, mother, and other family members; Diabetes in her brother and mother; Hypertension in her father; Joint Pain in her sister; Neck Pain in her sister; Osteoporosis in her sister; Premature Heart Disease in an other family member; Stroke in an other family member.    Social History     Socioeconomic History    Marital status: Single   Tobacco Use    Smoking status: Some Days     Current packs/day: 0.25     Average packs/day: 0.3 packs/day for 40.0 years (10.0 ttl pk-yrs)     Types: Cigarettes     Passive exposure: Current    Smokeless tobacco: Never    Tobacco comments:     variable 1-1/2 a day, usually 1 or 2 per day.   Substance and Sexual Activity    Alcohol use: Not Currently     Alcohol/week: 2.0 standard drinks of alcohol     Types: 2 Cans of beer per week     Comment: social    Drug use: Not Currently     Types: Marijuana     Comment: Gummies THC    Sexual activity: Not Currently     Partners: Male Birth control/protection: Condom       Allergies   Allergen Reactions    Erythromycin RASH     Rash on trunk    Strawberry RASH    Ascorbic Acid NAUSEA ONLY and UNKNOWN       There were no vitals filed for this visit.          REVIEW OF SYSTEMS: 10 point ROS obtained and negative except as stated above      PHYSICAL EXAM:  General: Alert, cooperative, no acute distress.  HEENT: Normocephalic, atraumatic.  Neck: Supple.  Lungs: Unlabored respirations, bilateral and equal chest excursion.  Heart: Regular rate.  Skin: Warm and dry to touch.  Abdomen: Nondistended.  MSK: No deformity  Neurological: Alert and oriented x3.          IMPRESSION:    1. Lumbar radiculopathy         PLAN:    Caudal ESI

## 2022-11-19 NOTE — Procedures
Attending Surgeon: [name], MD    Anesthesia: Local    Pre-Procedure Diagnosis:   1. Lumbar radiculopathy        Post-Procedure Diagnosis:   1. Lumbar radiculopathy             Epidural Steroid Injection Lumbar/Caudal  Procedure: epidural - interlaminar    Laterality: n/a   on 11/19/2022 12:12 PM  Location: caudal - Caudal      Consent:   Consent obtained: verbal and written  Consent given by: patient  Risks discussed: allergic reaction, bleeding, bruising, infection, nerve damage, no change or worsening in pain, reaction to medication and weakness    Discussed with patient the purpose of the treatment/procedure, other ways of treating my condition, including no treatment/ procedure and the risks and benefits of the alternatives. Patient has decided to proceed with treatment/procedure.        Universal Protocol:  Relevant documents: relevant documents present and verified  Test results: test results available and properly labeled  Imaging studies: imaging studies available  Required items: required blood products, implants, devices, and special equipment available  Site marked: the operative site was marked  Patient identity confirmed: Patient identify confirmed verbally with patient.        Time out: Immediately prior to procedure a time out was called to verify the correct patient, procedure, equipment, support staff and site/side marked as required      Procedures Details:   Indications: pain   Prep: chlorhexidine  Patient position: prone  Estimated Blood Loss: minimal  Specimens: none  Number of Levels: 1  Approach: midline  Guidance: fluoroscopy  Contrast: Procedure confirmed with contrast under live fluoroscopy.  Needle and Epidural Catheter: quincke  Needle size: 22 G  Injection procedure: Incremental injection and Negative aspiration for blood  Patient tolerance: Patient tolerated the procedure well with no immediate complications. Pressure was applied, and hemostasis was accomplished.  Comments: DESCRIPTION OF PROCEDURE:  The procedure risks and benefits were explained and informed consent was obtained from the patient.  The patient was positioned prone on the fluoroscopy table with a pillow under the abdomen.  Blood pressure cuff and oxygen saturation monitors were attached.  The patient was monitored throughout the procedure.  The sacral hiatus was identified with the use of fluoroscopy in the lateral view.  The skin was prepped with chlorhexidine and draped in aseptic fashion.  The skin and subcutaneous tissue were anesthetized using 4 mL of 1 percent lidocaine with a 27-gauge needle.  A 3.5-inch, 22-gauge needle was slowly advanced in the lateral view towards the sacral hiatus.  Once the needle was in position, 0.6 mL of contrast was injected and demonstrated epidural spread.  After negative aspiration, a 6 mL solution containing 2 mL of normal saline and 2 mL of 1 percent lidocaine and 80 mg of Kenalog was injected in increments.  The stylet was reinserted and needle was removed.  After the procedure, the patient's blood pressure, heart rate, oxygen saturation, and VAS were recorded in the chart.     There were no complications. The patient tolerated procedure well and was brought to the recovery room for observation in stable condition with discharge instructions     PLAN OF CARE:  The patient follow up in clinic in 3 weeks.     The patient was advised to contact the Spine Center for any of the following.  Fever, chills, or night sweats.  New onset of severe sharp pain.  Any new upper  or lower extremity weakness or numbness.  Any questions regarding the procedure.     If unable to contact the Spine Center, the patient was instructed to go to local emergency room.         This patient's clinical history, exam, AND imaging support radiculopathy AND there is a significant impact on quality of life and function AND the pain has been present for at least 4 weeks AND they have failed to improve with noninvasive conservative care.  This patient had at least 50% pain relief for at least 3 months with the last epidural injection.    This patient's pain is so severe it results in a significant degree of disability. Prior ESI has provided at least a 50% improvement in pain and function for at least 3 months. The patient's Primary Care Physician has been notified of the continuation of this procedure and prolonged repeat steroid use. The patient does not desire surgery.  Administrations This Visit       iohexoL (OMNIPAQUE-300) 300 mg/mL injection 1 mL       Admin Date  11/19/2022 Action  Given Dose  1 mL Route  SEE ADMIN INSTRUCTIONS Documented By  [name], MD              lidocaine PF 1% (10 mg/mL) injection 5 mL       Admin Date  11/19/2022 Action  Given Dose  5 mL Route  Injection Documented By  [name], MD              sodium chloride PF 0.9% injection 3 mL       Admin Date  11/19/2022 Action  Given Dose  3 mL Route  Injection Documented By  [name], MD              triamcinolone acetonide [facility]) injection 80 mg       Admin Date  11/19/2022 Action  Given Dose  80 mg Route  Epidural Documented By  [name], MD                  Estimated blood loss: none or minimal  Specimens: none  Patient tolerated the procedure well with no immediate complications. Pressure was applied, and hemostasis was accomplished.

## 2022-11-19 NOTE — Discharge Instructions - Supplementary Instructions
GENERAL POST PROCEDURE INSTRUCTIONS  Physician: _________________________________  Procedure Completed Today:  Joint Injection (hip, knee, shoulder)  Cervical Epidural Steroid Injection  Cervical Transforaminal Steroid Injection  Trigger Point Injection  Caudal Epidural Steroid Injection  Piriformis Injection  Pudendal Nerve Block  Other _____________________ Thoracic Epidural Steroid Injection  Lumbar Epidural Steroid Injection  Lumbar Transforaminal Steroid Injection  Facet Joint Injection  Celiac Nerve Block  Sacrococcygeal  Sacroiliac Joint Injection   Important information following your procedure today:  You may drive today     If you had sedation, you may NOT drive today  Rest at home for the next 6 hours.  You may then begin to resume your normal activities.  DO NOT drive any vehicle, operate any power tools, drink alcohol, make any major decisions, or sign any legal documents for the next 12 hours.  Pain relief may not be immediate. It is possible you may even experience an increase in pain during the first 24-48 hours followed by a gradual decrease of your pain.  Though the procedure is generally safe, and complications are rare, we do ask that you be aware of any of the following:  Any swelling, persistent redness, new bleeding or drainage from the site of the injection.  You should not experience a severe headache.  You should not run a fever over 101oF.  New onset of sharp, severe back and or neck pain.  New onset of upper or lower extremity numbness or weakness.  New difficulty controlling bowel or bladder function after injection.  New shortness of breath.  ** If any of these occur, please call to report this occurrence to a nurse at [phone]. If you are calling after 4:00 p.m. or on weekends or holidays, please call [phone] and ask to have the resident physician on call for the physician paged or go to your local emergency room.  You may experience soreness at the injection site. Ice can be applied at 20-minute intervals for the first 24 hours. The following day you may alternate ice with heat if you are experiencing muscle tightness, otherwise continue with ice. Ice works best at decreasing pain. Avoid application of direct heat, hot showers or hot tubs today.  Avoid strenuous activity today. You many resume your regular activities and exercise tomorrow.  Patients with diabetes may see an elevation in blood sugars for 7-10 days after the injection. It is important to pay close attention to your diet, check your blood sugars daily and report extreme elevations to the physician that manages your diabetes.  Patients taking daily blood thinners can resume their regular dose this evening.  It is important that you take all medications ordered by your pain physician. Taking medications as ordered is an important part of your pain care plan. If you cannot continue the medication plan, please notify the physician.    Possible side effects to steroids that may occur:  Flushing or redness of the face  Irritability  Fluid retention  Change in women's menses  Minor headache    If you are unable to keep your upcoming appointment, please notify the [facility]ine Center scheduler at [phone] at least 24 hours in advance. If you have questions for the surgery center, call [facility] at [phone].

## 2022-11-21 ENCOUNTER — Encounter: Admit: 2022-11-21 | Discharge: 2022-11-21 | Payer: MEDICARE

## 2022-11-26 ENCOUNTER — Encounter: Admit: 2022-11-26 | Discharge: 2022-11-26 | Payer: MEDICARE

## 2022-12-10 ENCOUNTER — Encounter: Admit: 2022-12-10 | Discharge: 2022-12-10 | Payer: MEDICARE

## 2022-12-10 MED FILL — RXAMB NALTREXONE 1 MG/ML ORAL SUSPENSION (BATCHED COMPOUND): 33 days supply | Qty: 0.15 | Fill #2 | Status: AC

## 2022-12-11 ENCOUNTER — Encounter: Admit: 2022-12-11 | Discharge: 2022-12-11 | Payer: MEDICARE

## 2022-12-13 ENCOUNTER — Encounter: Admit: 2022-12-13 | Discharge: 2022-12-13 | Payer: MEDICARE

## 2022-12-13 DIAGNOSIS — M5416 Radiculopathy, lumbar region: Secondary | ICD-10-CM

## 2022-12-13 DIAGNOSIS — M4316 Spondylolisthesis, lumbar region: Secondary | ICD-10-CM

## 2022-12-13 DIAGNOSIS — M545 Acute exacerbation of chronic low back pain: Secondary | ICD-10-CM

## 2022-12-13 MED ORDER — PREGABALIN 200 MG PO CAP
200 mg | ORAL_CAPSULE | Freq: Three times a day (TID) | ORAL | 1 refills
Start: 2022-12-13 — End: ?

## 2022-12-13 NOTE — Telephone Encounter
Pt called and left a VM requesting a refill of her pregabalin.       LOV: 10/24/22  NOV: procedure 03/04/23  Lab: Creat WNL 03/23/22    Routing to A. April Manson, APRN for consideration.

## 2022-12-14 ENCOUNTER — Encounter: Admit: 2022-12-14 | Discharge: 2022-12-14 | Payer: MEDICARE

## 2022-12-14 DIAGNOSIS — M545 Acute exacerbation of chronic low back pain: Secondary | ICD-10-CM

## 2022-12-14 DIAGNOSIS — M4316 Spondylolisthesis, lumbar region: Secondary | ICD-10-CM

## 2022-12-14 DIAGNOSIS — M5416 Radiculopathy, lumbar region: Secondary | ICD-10-CM

## 2022-12-14 MED ORDER — PREGABALIN 200 MG PO CAP
200 mg | ORAL_CAPSULE | Freq: Three times a day (TID) | ORAL | 1 refills
Start: 2022-12-14 — End: ?

## 2022-12-26 ENCOUNTER — Encounter: Admit: 2022-12-26 | Discharge: 2022-12-26 | Payer: MEDICARE

## 2022-12-26 NOTE — Telephone Encounter
Patient called and Left a VM asking if Dr. would refill her lidocaine( emla) cream.     Patient requesting refill of Lidocaine cream  Last Office Visit 11/19/2022  Next Office Visit [id number]  Required labs 01/02/2023  [facility] last filled NA     RN will follow up with Bella Kennedy, APRN for orders.

## 2022-12-27 ENCOUNTER — Encounter: Admit: 2022-12-27 | Discharge: 2022-12-27 | Payer: MEDICARE

## 2022-12-27 MED ORDER — LIDOCAINE-PRILOCAINE 2.5-2.5 % TP CREA
Freq: Three times a day (TID) | TOPICAL | 11 refills | Status: AC | PRN
Start: 2022-12-27 — End: ?

## 2022-12-31 ENCOUNTER — Encounter: Admit: 2022-12-31 | Discharge: 2022-12-31 | Payer: MEDICARE

## 2022-12-31 MED ORDER — RXAMB NALTREXONE 1 MG/ML ORAL SUSPENSION (BATCHED COMPOUND)
1 refills
Start: 2022-12-31 — End: ?

## 2023-01-02 ENCOUNTER — Encounter: Admit: 2023-01-02 | Discharge: 2023-01-02 | Payer: MEDICARE

## 2023-01-02 ENCOUNTER — Ambulatory Visit: Admit: 2023-01-02 | Discharge: 2023-01-03 | Payer: MEDICARE

## 2023-01-02 DIAGNOSIS — F419 Anxiety disorder, unspecified: Secondary | ICD-10-CM

## 2023-01-02 DIAGNOSIS — Z86010 Personal history of colonic polyps: Secondary | ICD-10-CM

## 2023-01-02 DIAGNOSIS — R519 Generalized headaches: Secondary | ICD-10-CM

## 2023-01-02 DIAGNOSIS — M810 Age-related osteoporosis without current pathological fracture: Secondary | ICD-10-CM

## 2023-01-02 DIAGNOSIS — Z8614 Personal history of Methicillin resistant Staphylococcus aureus infection: Secondary | ICD-10-CM

## 2023-01-02 DIAGNOSIS — M5020 Other cervical disc displacement, unspecified cervical region: Secondary | ICD-10-CM

## 2023-01-02 DIAGNOSIS — Z8679 Personal history of other diseases of the circulatory system: Secondary | ICD-10-CM

## 2023-01-02 DIAGNOSIS — N649 Disorder of breast, unspecified: Secondary | ICD-10-CM

## 2023-01-02 DIAGNOSIS — M4800 Spinal stenosis, site unspecified: Secondary | ICD-10-CM

## 2023-01-02 DIAGNOSIS — M4802 Spinal stenosis, cervical region: Secondary | ICD-10-CM

## 2023-01-02 DIAGNOSIS — T148XXA Other injury of unspecified body region, initial encounter: Secondary | ICD-10-CM

## 2023-01-02 DIAGNOSIS — J45909 Unspecified asthma, uncomplicated: Secondary | ICD-10-CM

## 2023-01-02 DIAGNOSIS — M4316 Spondylolisthesis, lumbar region: Secondary | ICD-10-CM

## 2023-01-02 DIAGNOSIS — M5416 Radiculopathy, lumbar region: Secondary | ICD-10-CM

## 2023-01-02 DIAGNOSIS — G971 Other reaction to spinal and lumbar puncture: Secondary | ICD-10-CM

## 2023-01-02 DIAGNOSIS — T7840XA Allergy, unspecified, initial encounter: Secondary | ICD-10-CM

## 2023-01-02 DIAGNOSIS — K219 Gastro-esophageal reflux disease without esophagitis: Secondary | ICD-10-CM

## 2023-01-02 DIAGNOSIS — J439 Emphysema, unspecified: Secondary | ICD-10-CM

## 2023-01-02 DIAGNOSIS — M797 Fibromyalgia: Secondary | ICD-10-CM

## 2023-01-02 DIAGNOSIS — I8390 Asymptomatic varicose veins of unspecified lower extremity: Secondary | ICD-10-CM

## 2023-01-02 DIAGNOSIS — G959 Disease of spinal cord, unspecified: Secondary | ICD-10-CM

## 2023-01-02 DIAGNOSIS — M47816 Spondylosis without myelopathy or radiculopathy, lumbar region: Secondary | ICD-10-CM

## 2023-01-02 DIAGNOSIS — I619 Nontraumatic intracerebral hemorrhage, unspecified: Secondary | ICD-10-CM

## 2023-01-02 DIAGNOSIS — I10 Essential (primary) hypertension: Secondary | ICD-10-CM

## 2023-01-02 DIAGNOSIS — K5900 Constipation, unspecified: Secondary | ICD-10-CM

## 2023-01-02 DIAGNOSIS — E785 Hyperlipidemia, unspecified: Secondary | ICD-10-CM

## 2023-01-02 DIAGNOSIS — J189 Pneumonia, unspecified organism: Secondary | ICD-10-CM

## 2023-01-02 DIAGNOSIS — M5030 Other cervical disc degeneration, unspecified cervical region: Secondary | ICD-10-CM

## 2023-01-02 DIAGNOSIS — G5600 Carpal tunnel syndrome, unspecified upper limb: Secondary | ICD-10-CM

## 2023-01-02 DIAGNOSIS — M1990 Unspecified osteoarthritis, unspecified site: Secondary | ICD-10-CM

## 2023-01-02 DIAGNOSIS — Z79899 Other long term (current) drug therapy: Secondary | ICD-10-CM

## 2023-01-02 DIAGNOSIS — M2550 Pain in unspecified joint: Secondary | ICD-10-CM

## 2023-01-02 DIAGNOSIS — M629 Disorder of muscle, unspecified: Secondary | ICD-10-CM

## 2023-01-02 NOTE — Progress Notes
[facility] CLINIC NOTE       SUBJECTIVE:   Kristen Vaughn is a 62 y.o.-year-old female with history of ACDF who presents for follow-up after caudal epidural steroid injection on 11/19/22. Patient reports no relief with the procedure. Patient reports since the injection she has tried to increase her activity and now has worsening pain and numbness in the left low back. She reports cleaning her shower and walls. She was also started on LDN at last visit. She is up to 6.5mg . She denies any side effects. She also reports when she misses a dose of Lyrica she has increased pain. She has not been able to stop smoking. She has previously participated in physical therapy. Pain is at preprocedure level. VAS pain score is rated a 6/10. Denies any loss of control of bowel or bladder.        Review of Systems    Current Outpatient Medications:     acetaminophen (TYLENOL) 500 mg tablet, Take two tablets by mouth every 6 hours as needed for Pain. Max of 4,000 mg of acetaminophen in 24 hours., Disp: 500 tablet, Rfl: 1    albuterol sulfate (PROAIR HFA) 90 mcg/actuation HFA aerosol inhaler, Inhale two puffs by mouth into the lungs every 6 hours as needed for Wheezing or Shortness of Breath. Shake well before use., Disp: , Rfl:     AMITR/GABAPEN/EMU OIL 12/26/08% CREAM (COMPOUND), Apply 0.5grams to affected area three times daily PRN for pain, Disp: 50 g, Rfl: 3    aspirin EC 81 mg tablet, Take one tablet by mouth daily. Take with food., Disp: , Rfl:     baclofen (LIORESAL) 10 mg tablet, Take one tablet by mouth three times daily., Disp: , Rfl:     calcium carbonate (CALCIUM 600 PO), Take 1 tablet by mouth daily., Disp: , Rfl:     celecoxib (CELEBREX) 200 mg capsule, Take one capsule by mouth daily., Disp: , Rfl:     cetirizine (ZYRTEC) 10 mg tablet, Take one tablet by mouth daily as needed for Allergy symptoms., Disp: , Rfl:     Cholecalciferol (Vitamin D3) (VITAMIN D-3) 2,000 unit cap, Take two capsules by mouth daily. (Patient taking differently: Take one capsule by mouth daily.), Disp: 300 capsule, Rfl: 11    clonazePAM (KLONOPIN) 1 mg tablet, Take one tablet by mouth daily as needed., Disp: , Rfl:     denosumab (PROLIA) 60 mg/mL injection, Inject 1 mL under the skin every 180 days., Disp: , Rfl:     desvenlafaxine succinate (PRISTIQ) 100 mg tablet, Take one tablet by mouth daily., Disp: , Rfl:     diclofenac (VOLTAREN) 1 % topical gel, Apply four g topically to affected area four times daily., Disp: , Rfl:     fish oil- omega 3-DHA/EPA 300/1,000 mg capsule, Take one capsule by mouth daily., Disp: , Rfl:     fluticasone propionate (FLONASE ALLERGY RELIEF) 50 mcg/actuation nasal spray, suspension, Apply two sprays to each nostril as directed daily., Disp: 48 g, Rfl: 2    ketoconazole (NIZORAL) 2 % topical cream, Apply  topically to affected area twice daily., Disp: 60 g, Rfl: 11    lidocaine (LIDODERM) 5 % topical patch, Apply one patch topically to affected area daily. Apply patch for 12 hours, then remove for 12 hours before repeating., Disp: 90 patch, Rfl: 3    lidocaine/prilocaine (EMLA) 2.5/2.5 % topical cream, Apply  topically to affected area three times daily as needed., Disp: 30 g, Rfl: 11  lisinopriL (ZESTRIL) 40 mg tablet, Take one tablet by mouth daily. for high blood pressure, Disp: , Rfl:     mirtazapine (REMERON) 15 mg tablet, Take one tablet by mouth at bedtime daily., Disp: , Rfl:     mupirocin (BACTROBAN) 2 % topical ointment, Apply  topically to affected area twice daily., Disp: 22 g, Rfl: 11    naltrexone 1 mg/mL oral suspension (BATCHED COMPOUND), Take 1.71ml by mouth daily for 2 weeks, then 3ml daily for 2 weeks, then 4.91ml daily for 2 weeks., Disp: 150 mL, Rfl: 1    naltrexone(#) 1 mg/mL, Take 3 mL by mouth daily., Disp: , Rfl:     nicotine (NICOTROL NS) 10 mg/mL nasal spray, Use 1 spray in each nostril 1-2 times per hour, not to exceed 40 times per day, for up to 12 weeks., Disp: 20 mL, Rfl: 3    omeprazole DR(+) (PRILOSEC) 40 mg capsule, Take one capsule by mouth daily before breakfast., Disp: , Rfl:     pregabalin (LYRICA) 200 mg capsule, Take one capsule by mouth three times daily., Disp: 90 capsule, Rfl: 1    REXULTI 2 mg tablet, Take one tablet by mouth daily., Disp: , Rfl:     rosuvastatin (CRESTOR) 10 mg tablet, TAKE 1 TABLET BY MOUTH DAILY, Disp: 90 tablet, Rfl: 3    triamcinolone acetonide (KENALOG) 0.1 % topical ointment, APPLY TO AFFECTED AREA TWICE A DAY, Disp: 30 g, Rfl: 3    VYVANSE 50 mg capsule, TAKE 1 CAPSULE ORALLY DAILY, Disp: , Rfl:     walker (ULTRA-LIGHT ROLLATOR) medical supply, Use as directed., Disp: 1 each, Rfl: 0    Current Facility-Administered Medications:     diazePAM (VALIUM) tablet 5 mg, 5 mg, Oral, PRN, [name], MD, 5 mg at 04/27/19 1429  Allergies   Allergen Reactions    Erythromycin RASH     Rash on trunk    Strawberry RASH    Ascorbic Acid NAUSEA ONLY and UNKNOWN     Physical Exam  Vitals:    01/02/23 1124   BP: 137/87   BP Source: Arm, Right Upper   Pulse: 61   SpO2: 98%   PainSc: Six   Weight: 108.9 kg (240 lb)   Height: 170.2 cm (5' 7)     Oswestry Total Score:: 70  Pain Score: Six  Body mass index is 37.59 kg/m?Marland Kitchen  General: 62 y.o. female appears stated age, in no acute distress  HEENT: Normocephalic, atraumatic  Neck: No thyroidmegaly  Cardiovascular: Well perfused  Pulmonary: Unlabored respirations  Extremities: No cyanosis, clubbing, or edema  Skin: Warm and dry  Psychiatric:  Appropriate mood and affect  Musculoskeletal: Poor standing tolerance. Decreased range of motion with lumbar flexion, extension, and lateral rotation.  Tender to palpation at lower lumbar facets, left PSIS, and paraspinals. Facet loading is positive bilaterally.    Neurologic: Lower extremity myotomes are all antigravity.  Lower extremity dermatomes are all intact to light touch.  Deep tendon reflexes are symmetric at patella and achilles.  Negative slump test bilaterally.  No ankle clonus. Radiographs of the lumbar spine on 03/02/22 were personally reviewed and demonstrated grade 1 anterolisthesis at L4-L5 with moderate lumbar facet arthropathy.        IMPRESSION:  1. Lumbar radiculopathy    2. Spondylolisthesis, lumbar region    3. Lumbar spondylosis    4. Medication management        PLAN:  1.  Lifestyle modifications.  Recommend activity as tolerated.  Avoid provocative maneuvers.  Keep spine in neutral position. I would recommend reaching out to her PCP to discuss additional interventions to help her with smoking cessation.   2.  Medications.  She may continue with Lyrica and LDN. If she does not have relief with LDN, I would recommend discontinuing. If she does have relief, may consider switching to capsule form. No other changes indicated at this time. Medication usage and safety reviewed.   3.  Therapy.  We discussed returning to physical therapy. She would like to defer at this time.   4.  Imaging.  None indicated at this time.   5.  Interventions. I would not recommend additional epidurals at this time. I encouraged her to work on her smoking cessation efforts so she may be a surgical candidate.   6.  Follow-up.  Patient to follow-up in June or sooner if needed.     Total Time Today was 32 minutes in the following activities: Preparing to see the patient, Obtaining and/or reviewing separately obtained history, Performing a medically appropriate examination and/or evaluation, Counseling and educating the patient/family/caregiver, Ordering medications, tests, or procedures and Documenting clinical information in the electronic or other health record.

## 2023-01-03 ENCOUNTER — Encounter: Admit: 2023-01-03 | Discharge: 2023-01-03 | Payer: MEDICARE

## 2023-01-04 ENCOUNTER — Encounter: Admit: 2023-01-04 | Discharge: 2023-01-04 | Payer: MEDICARE

## 2023-01-05 MED FILL — RXAMB NALTREXONE 1 MG/ML ORAL SUSPENSION (BATCHED COMPOUND): 38 days supply | Qty: 0.25 | Fill #1 | Status: AC

## 2023-02-12 ENCOUNTER — Encounter: Admit: 2023-02-12 | Discharge: 2023-02-12 | Payer: MEDICARE

## 2023-02-12 DIAGNOSIS — M4316 Spondylolisthesis, lumbar region: Secondary | ICD-10-CM

## 2023-02-12 DIAGNOSIS — M545 Acute exacerbation of chronic low back pain: Secondary | ICD-10-CM

## 2023-02-12 DIAGNOSIS — M5416 Radiculopathy, lumbar region: Secondary | ICD-10-CM

## 2023-02-12 MED ORDER — PREGABALIN 200 MG PO CAP
200 mg | ORAL_CAPSULE | Freq: Three times a day (TID) | ORAL | 1 refills
Start: 2023-02-12 — End: ?

## 2023-02-14 ENCOUNTER — Encounter: Admit: 2023-02-14 | Discharge: 2023-02-14 | Payer: MEDICARE

## 2023-02-14 NOTE — Telephone Encounter
An encounter has been created for documentation only (often for preparation of an upcoming appointment or for follow up on orders/imaging or records received) and patient does not need contact RN and did not miss a phone call or appointment.     Pre-visit planning for upcoming appointment with Dr.Luthra    FUV    -[id number]    Daytime sleepiness    Restless leg syndrome    Delayed sleep phase syndrome    -Last SS: 08/26/22     -LV: 07/13/22 with Dr. Clarene Critchley    -10/26/22: RN provides pt with most recent SS results and recommendations; orders placed.    -01/10/23: ONO on CPAP & RA      -DME: [facility]    RN obtained download from AV

## 2023-02-20 ENCOUNTER — Ambulatory Visit: Admit: 2023-02-20 | Discharge: 2023-02-21 | Payer: MEDICARE

## 2023-02-20 ENCOUNTER — Encounter: Admit: 2023-02-20 | Discharge: 2023-02-20 | Payer: MEDICARE

## 2023-02-20 DIAGNOSIS — I10 Essential (primary) hypertension: Secondary | ICD-10-CM

## 2023-02-20 DIAGNOSIS — M810 Age-related osteoporosis without current pathological fracture: Secondary | ICD-10-CM

## 2023-02-20 DIAGNOSIS — J45909 Unspecified asthma, uncomplicated: Secondary | ICD-10-CM

## 2023-02-20 DIAGNOSIS — M629 Disorder of muscle, unspecified: Secondary | ICD-10-CM

## 2023-02-20 DIAGNOSIS — K5900 Constipation, unspecified: Secondary | ICD-10-CM

## 2023-02-20 DIAGNOSIS — I619 Nontraumatic intracerebral hemorrhage, unspecified: Secondary | ICD-10-CM

## 2023-02-20 DIAGNOSIS — E785 Hyperlipidemia, unspecified: Secondary | ICD-10-CM

## 2023-02-20 DIAGNOSIS — K219 Gastro-esophageal reflux disease without esophagitis: Secondary | ICD-10-CM

## 2023-02-20 DIAGNOSIS — Z8679 Personal history of other diseases of the circulatory system: Secondary | ICD-10-CM

## 2023-02-20 DIAGNOSIS — M4800 Spinal stenosis, site unspecified: Secondary | ICD-10-CM

## 2023-02-20 DIAGNOSIS — I8390 Asymptomatic varicose veins of unspecified lower extremity: Secondary | ICD-10-CM

## 2023-02-20 DIAGNOSIS — T7840XA Allergy, unspecified, initial encounter: Secondary | ICD-10-CM

## 2023-02-20 DIAGNOSIS — F419 Anxiety disorder, unspecified: Secondary | ICD-10-CM

## 2023-02-20 DIAGNOSIS — M4802 Spinal stenosis, cervical region: Secondary | ICD-10-CM

## 2023-02-20 DIAGNOSIS — M2550 Pain in unspecified joint: Secondary | ICD-10-CM

## 2023-02-20 DIAGNOSIS — T148XXA Other injury of unspecified body region, initial encounter: Secondary | ICD-10-CM

## 2023-02-20 DIAGNOSIS — M5030 Other cervical disc degeneration, unspecified cervical region: Secondary | ICD-10-CM

## 2023-02-20 DIAGNOSIS — G5600 Carpal tunnel syndrome, unspecified upper limb: Secondary | ICD-10-CM

## 2023-02-20 DIAGNOSIS — J189 Pneumonia, unspecified organism: Secondary | ICD-10-CM

## 2023-02-20 DIAGNOSIS — Z86010 Personal history of colonic polyps: Secondary | ICD-10-CM

## 2023-02-20 DIAGNOSIS — M5020 Other cervical disc displacement, unspecified cervical region: Secondary | ICD-10-CM

## 2023-02-20 DIAGNOSIS — M1990 Unspecified osteoarthritis, unspecified site: Secondary | ICD-10-CM

## 2023-02-20 DIAGNOSIS — J439 Emphysema, unspecified: Secondary | ICD-10-CM

## 2023-02-20 DIAGNOSIS — G971 Other reaction to spinal and lumbar puncture: Secondary | ICD-10-CM

## 2023-02-20 DIAGNOSIS — G4733 Obstructive sleep apnea (adult) (pediatric): Secondary | ICD-10-CM

## 2023-02-20 DIAGNOSIS — M797 Fibromyalgia: Secondary | ICD-10-CM

## 2023-02-20 DIAGNOSIS — G959 Disease of spinal cord, unspecified: Secondary | ICD-10-CM

## 2023-02-20 DIAGNOSIS — Z8614 Personal history of Methicillin resistant Staphylococcus aureus infection: Secondary | ICD-10-CM

## 2023-02-20 DIAGNOSIS — N649 Disorder of breast, unspecified: Secondary | ICD-10-CM

## 2023-02-20 DIAGNOSIS — R519 Generalized headaches: Secondary | ICD-10-CM

## 2023-02-22 ENCOUNTER — Encounter: Admit: 2023-02-22 | Discharge: 2023-02-22 | Payer: MEDICARE

## 2023-02-22 NOTE — Telephone Encounter
Order for mask refit placed as discussed per office note on 02/20/23    DME: [facility]  Will continue to follow up.      CPAP pressure changed to 8 to 18 cms H2O

## 2023-03-06 ENCOUNTER — Encounter: Admit: 2023-03-06 | Discharge: 2023-03-06 | Payer: MEDICARE

## 2023-03-06 DIAGNOSIS — E782 Mixed hyperlipidemia: Secondary | ICD-10-CM

## 2023-03-06 MED ORDER — ROSUVASTATIN 10 MG PO TAB
10 mg | ORAL_TABLET | Freq: Every day | ORAL | 1 refills | 90.00000 days | Status: AC
Start: 2023-03-06 — End: ?

## 2023-03-06 NOTE — Telephone Encounter
03/06/2023 12:19 PM   LVM regarding refill of rosuvastatin at new Pharmacy - [facility], need for annual visit, and need for labs (lipid panel & CMP).  Call Nurse Voicemail line at [phone] for scheduling or questions or call Scheduling at [phone] to schedule visit.    My Chart message sent.          03/06/2023 12:10 PM   Received a request via computer from the patients pharmacy requesting a refill.  Script e-scribed as requested.    Rosuvastatin 10 mg daily to new Pharmacy - [facility]

## 2023-03-11 ENCOUNTER — Encounter: Admit: 2023-03-11 | Discharge: 2023-03-11 | Payer: MEDICARE

## 2023-03-11 DIAGNOSIS — J302 Other seasonal allergic rhinitis: Secondary | ICD-10-CM

## 2023-03-11 MED ORDER — FLUTICASONE PROPIONATE 50 MCG/ACTUATION NA SPSN
2 | Freq: Every day | NASAL | 1 refills | 60.00000 days | Status: AC
Start: 2023-03-11 — End: ?

## 2023-03-13 ENCOUNTER — Encounter: Admit: 2023-03-13 | Discharge: 2023-03-13 | Payer: MEDICARE

## 2023-03-13 ENCOUNTER — Ambulatory Visit: Admit: 2023-03-13 | Discharge: 2023-03-14 | Payer: MEDICARE

## 2023-03-13 DIAGNOSIS — M797 Fibromyalgia: Secondary | ICD-10-CM

## 2023-03-13 DIAGNOSIS — I619 Nontraumatic intracerebral hemorrhage, unspecified: Secondary | ICD-10-CM

## 2023-03-13 DIAGNOSIS — M5020 Other cervical disc displacement, unspecified cervical region: Secondary | ICD-10-CM

## 2023-03-13 DIAGNOSIS — M4316 Spondylolisthesis, lumbar region: Secondary | ICD-10-CM

## 2023-03-13 DIAGNOSIS — F419 Anxiety disorder, unspecified: Secondary | ICD-10-CM

## 2023-03-13 DIAGNOSIS — M2550 Pain in unspecified joint: Secondary | ICD-10-CM

## 2023-03-13 DIAGNOSIS — I8390 Asymptomatic varicose veins of unspecified lower extremity: Secondary | ICD-10-CM

## 2023-03-13 DIAGNOSIS — Z8614 Personal history of Methicillin resistant Staphylococcus aureus infection: Secondary | ICD-10-CM

## 2023-03-13 DIAGNOSIS — N649 Disorder of breast, unspecified: Secondary | ICD-10-CM

## 2023-03-13 DIAGNOSIS — M1990 Unspecified osteoarthritis, unspecified site: Secondary | ICD-10-CM

## 2023-03-13 DIAGNOSIS — J439 Emphysema, unspecified: Secondary | ICD-10-CM

## 2023-03-13 DIAGNOSIS — E785 Hyperlipidemia, unspecified: Secondary | ICD-10-CM

## 2023-03-13 DIAGNOSIS — Z86010 Personal history of colonic polyps: Secondary | ICD-10-CM

## 2023-03-13 DIAGNOSIS — G971 Other reaction to spinal and lumbar puncture: Secondary | ICD-10-CM

## 2023-03-13 DIAGNOSIS — M4800 Spinal stenosis, site unspecified: Secondary | ICD-10-CM

## 2023-03-13 DIAGNOSIS — G5600 Carpal tunnel syndrome, unspecified upper limb: Secondary | ICD-10-CM

## 2023-03-13 DIAGNOSIS — Z8679 Personal history of other diseases of the circulatory system: Secondary | ICD-10-CM

## 2023-03-13 DIAGNOSIS — K219 Gastro-esophageal reflux disease without esophagitis: Secondary | ICD-10-CM

## 2023-03-13 DIAGNOSIS — R519 Generalized headaches: Secondary | ICD-10-CM

## 2023-03-13 DIAGNOSIS — K5900 Constipation, unspecified: Secondary | ICD-10-CM

## 2023-03-13 DIAGNOSIS — T148XXA Other injury of unspecified body region, initial encounter: Secondary | ICD-10-CM

## 2023-03-13 DIAGNOSIS — M629 Disorder of muscle, unspecified: Secondary | ICD-10-CM

## 2023-03-13 DIAGNOSIS — G959 Disease of spinal cord, unspecified: Secondary | ICD-10-CM

## 2023-03-13 DIAGNOSIS — I10 Essential (primary) hypertension: Secondary | ICD-10-CM

## 2023-03-13 DIAGNOSIS — Z79899 Other long term (current) drug therapy: Secondary | ICD-10-CM

## 2023-03-13 DIAGNOSIS — M5030 Other cervical disc degeneration, unspecified cervical region: Secondary | ICD-10-CM

## 2023-03-13 DIAGNOSIS — T7840XA Allergy, unspecified, initial encounter: Secondary | ICD-10-CM

## 2023-03-13 DIAGNOSIS — J189 Pneumonia, unspecified organism: Secondary | ICD-10-CM

## 2023-03-13 DIAGNOSIS — M810 Age-related osteoporosis without current pathological fracture: Secondary | ICD-10-CM

## 2023-03-13 DIAGNOSIS — J45909 Unspecified asthma, uncomplicated: Secondary | ICD-10-CM

## 2023-03-13 DIAGNOSIS — M4802 Spinal stenosis, cervical region: Secondary | ICD-10-CM

## 2023-03-13 MED ORDER — PREGABALIN 200 MG PO CAP
200 mg | ORAL_CAPSULE | Freq: Three times a day (TID) | ORAL | 1 refills | Status: AC
Start: 2023-03-13 — End: ?

## 2023-03-13 MED ORDER — CYCLOBENZAPRINE 5 MG PO TAB
5 mg | ORAL_TABLET | Freq: Three times a day (TID) | ORAL | 3 refills | 30.00000 days | Status: AC | PRN
Start: 2023-03-13 — End: ?

## 2023-03-13 NOTE — Progress Notes
SPINE CENTER FOLLOW UP         Date of Service: 03/13/2023     Subjective:        follow up appointment        History of Present Illness    Kristen Vaughn is a 62 y.o. female  with history of ACDF who presents for follow-up. Patient was last seen 01/02/23. Patient last had caudal epidural injection in Feb 2024.     Patient noted she stopped taking the naltrexone a month ago because it did not really changer her pain level in her pain. Patient noted she was up to 6mg . Patient noted she is still taking Lyrica which is helping. Patient noted she is also taking celbrex consistently. She noted she occasionally takes Tylenol arthritis. Patient noted she takes lidocaine cream and would like a refill. Patient noted she is taking baclofen and it feels as though it's not working anymore. Patient noted she takes 10mg  BID. Patient noted she has taken tizanidine  in the past and it may have helped. Patient noted she currently rates back pain 6-7/10. Patient noted that the epidural injection did give her more better days. Patient noted she is interested in another injection. Patient denied recent falls or bowel and bladder incontinence. Patient noted pain still shoots down left leg.      Review of Systems   Constitutional:  Negative for chills and fever.   Musculoskeletal:  Positive for arthralgias, myalgias and neck pain.   Neurological:  Positive for dizziness and numbness. Negative for light-headedness.         Objective:          acetaminophen (TYLENOL) 500 mg tablet Take two tablets by mouth every 6 hours as needed for Pain. Max of 4,000 mg of acetaminophen in 24 hours.    albuterol sulfate (PROAIR HFA) 90 mcg/actuation HFA aerosol inhaler Inhale two puffs by mouth into the lungs every 6 hours as needed for Wheezing or Shortness of Breath. Shake well before use.    AMITR/GABAPEN/EMU OIL 12/26/08% CREAM (COMPOUND) Apply 0.5grams to affected area three times daily PRN for pain    aspirin EC 81 mg tablet Take one tablet by mouth daily. Take with food.    baclofen (LIORESAL) 10 mg tablet Take one tablet by mouth three times daily.    calcium carbonate (CALCIUM 600 PO) Take 1 tablet by mouth daily.    celecoxib (CELEBREX) 200 mg capsule Take one capsule by mouth daily.    cetirizine (ZYRTEC) 10 mg tablet Take one tablet by mouth daily as needed for Allergy symptoms.    Cholecalciferol (Vitamin D3) (VITAMIN D-3) 2,000 unit cap Take two capsules by mouth daily. (Patient taking differently: Take one capsule by mouth daily.)    clonazePAM (KLONOPIN) 1 mg tablet Take one tablet by mouth daily as needed.    denosumab (PROLIA) 60 mg/mL injection Inject 1 mL under the skin every 180 days.    desvenlafaxine succinate (PRISTIQ) 100 mg tablet Take one tablet by mouth daily.    diclofenac (VOLTAREN) 1 % topical gel Apply four g topically to affected area four times daily.    fish oil- omega 3-DHA/EPA 300/1,000 mg capsule Take one capsule by mouth daily.    fluticasone propionate (FLONASE ALLERGY RELIEF) 50 mcg/actuation nasal spray, suspension Apply two sprays to each nostril as directed daily.    ketoconazole (NIZORAL) 2 % topical cream Apply  topically to affected area twice daily.    lidocaine (LIDODERM) 5 % topical  patch Apply one patch topically to affected area daily. Apply patch for 12 hours, then remove for 12 hours before repeating.    lidocaine/prilocaine (EMLA) 2.5/2.5 % topical cream Apply  topically to affected area three times daily as needed.    lisinopriL (ZESTRIL) 40 mg tablet Take one tablet by mouth daily. for high blood pressure    mirtazapine (REMERON) 15 mg tablet Take one tablet by mouth at bedtime daily.    mupirocin (BACTROBAN) 2 % topical ointment Apply  topically to affected area twice daily.    naltrexone 1 mg/mL oral suspension (BATCHED COMPOUND) Take 6.5 mL by mouth once daily.    naltrexone(#) 1 mg/mL Take 3 mL by mouth daily.    nicotine (NICOTROL NS) 10 mg/mL nasal spray Use 1 spray in each nostril 1-2 times per hour, not to exceed 40 times per day, for up to 12 weeks.    omeprazole DR(+) (PRILOSEC) 40 mg capsule Take one capsule by mouth daily before breakfast.    pregabalin (LYRICA) 200 mg capsule TAKE 1 CAPSULE BY MOUTH THREE TIMES A DAY    REXULTI 2 mg tablet Take one tablet by mouth daily.    rosuvastatin (CRESTOR) 10 mg tablet Take one tablet by mouth daily.    triamcinolone acetonide (KENALOG) 0.1 % topical ointment APPLY TO AFFECTED AREA TWICE A DAY    VYVANSE 50 mg capsule TAKE 1 CAPSULE ORALLY DAILY    walker (ULTRA-LIGHT ROLLATOR) medical supply Use as directed.     There were no vitals filed for this visit.  There is no height or weight on file to calculate BMI.       Physical Exam  General: 62 y.o. female appears stated age, in no acute distress  HEENT: Normocephalic, atraumatic  Neck: No thyroidmegaly  Cardiovascular: Well perfused  Pulmonary: Unlabored respirations  Extremities: No cyanosis, clubbing, or edema  Skin: Warm and dry  Psychiatric:  Appropriate mood and affect  Musculoskeletal: 5/5 BUE MSK exam. 4/5 right hip flexion 4-/5 left hip flexion. 4/5 bilateral hallux extension.   Negative straight leg test and slump test bilaterally. Tenderness to palpation of spinous process of L5-S1 region that radiates laterally on back to the paraspinal muscles.. Patient with bilateral positive facet loading.        Assessment and Plan:  1. Lumbar radiculopathy   2. Spondylolisthesis, lumbar region   3. Lumbar spondylosis   4. Medication management         PLAN:  1.  Lifestyle modifications.  Recommend activity as tolerated.  Avoid provocative maneuvers.  Keep spine in neutral position. I would recommend reaching out to her PCP to discuss additional interventions to help her with smoking cessation.   2.  Medications.  We will discontinue naltrexone and baclofen. Continue with Lyrica. Medication usage and safety reviewed.   3.  Therapy.  We discussed returning to physical therapy. She would like to defer at this time. 4.  Imaging.  None indicated at this time.   5.  Interventions. I would not recommend additional epidurals at this time. I encouraged her to work on her smoking cessation efforts so she may be a surgical candidate.   6.  Follow-up.  Patient to follow-up in June or sooner if needed.     ATTESTATION    I personally performed the key portions of the E/M visit, discussed case with resident and concur with resident documentation of history, physical exam, assessment, and treatment plan unless otherwise noted.    Kristen Vaughn  is a 62 year old female with history of chronic back pain, ACDF, and restless leg syndrome, presents for follow-up on back pain.  She was last seen 01/02/2023, at which point she was provided refill for pregabalin and provided prescription for low-dose naltrexone.  She states LDN has been ineffective, but continued benefit with with pregabalin.  She is taking baclofen, but denies relief.  She is interested in trying cyclobenzaprine.  She has had prior caudal epidural steroid injection which she estimates greater than 50% relief for approximately 3 months.  She is interested in having this repeated.  VAS pain score is variable.    On examination she is grossly neurologically intact.  No upper motor neuron signs.    Kristen Vaughn is a 62 year old female presents with low back pain secondary to lumbar radiculopathy in setting of spondylolisthesis and lumbar spondylosis.  I recommend repeating caudal epidural steroid injection.  We discussed risk and benefits of procedure including pain, burning, infection, and damage to her structures.  She would like to proceed.  I will provide refill for Lyrica.  Will discontinue low-dose naltrexone.  Will try cyclobenzaprine 5 mg 3 times daily as needed.  I provided refill for Emla cream.  Will see her back for procedure.    Staff name: Curly Rim, MD  Date: 03/13/23

## 2023-03-14 ENCOUNTER — Encounter: Admit: 2023-03-14 | Discharge: 2023-03-14 | Payer: MEDICARE

## 2023-03-14 DIAGNOSIS — M5416 Radiculopathy, lumbar region: Secondary | ICD-10-CM

## 2023-03-14 DIAGNOSIS — M545 Low back pain, unspecified: Secondary | ICD-10-CM

## 2023-03-14 DIAGNOSIS — G8929 Other chronic pain: Secondary | ICD-10-CM

## 2023-03-14 NOTE — Telephone Encounter
Patient called and left a VM stating that she saw Dr Katrinka Blazing yesterday and that lidocaine/prilocaine cream was supposed to be sent to her pharmacy in [place], along with Lyrica and Flexeril but that all that she has is Lyrica.  Patient inquiring why the other medications were not sent.    Patient called back by this RN and notified that the Flexeril was sent to [facility]. Patient requested that that medication be sent to her local pharmacy.  Patient also requesting the Emla cream be sent to her local pharmacy.

## 2023-03-21 ENCOUNTER — Encounter: Admit: 2023-03-21 | Discharge: 2023-03-21 | Payer: MEDICARE

## 2023-03-21 NOTE — Telephone Encounter
The patient's PAP compliance f/u office visit notes from 02/20/23 were faxed to their DME, [facility].

## 2023-04-01 ENCOUNTER — Encounter: Admit: 2023-04-01 | Discharge: 2023-04-01 | Payer: MEDICARE

## 2023-04-02 ENCOUNTER — Encounter: Admit: 2023-04-02 | Discharge: 2023-04-02 | Payer: MEDICARE

## 2023-04-02 DIAGNOSIS — Z8614 Personal history of Methicillin resistant Staphylococcus aureus infection: Secondary | ICD-10-CM

## 2023-04-02 DIAGNOSIS — M1990 Unspecified osteoarthritis, unspecified site: Secondary | ICD-10-CM

## 2023-04-02 DIAGNOSIS — Z720 Tobacco use: Secondary | ICD-10-CM

## 2023-04-02 DIAGNOSIS — Z136 Encounter for screening for cardiovascular disorders: Secondary | ICD-10-CM

## 2023-04-02 DIAGNOSIS — E782 Mixed hyperlipidemia: Secondary | ICD-10-CM

## 2023-04-02 DIAGNOSIS — G959 Disease of spinal cord, unspecified: Secondary | ICD-10-CM

## 2023-04-02 DIAGNOSIS — M4800 Spinal stenosis, site unspecified: Secondary | ICD-10-CM

## 2023-04-02 DIAGNOSIS — M2550 Pain in unspecified joint: Secondary | ICD-10-CM

## 2023-04-02 DIAGNOSIS — I10 Essential (primary) hypertension: Secondary | ICD-10-CM

## 2023-04-02 DIAGNOSIS — G971 Other reaction to spinal and lumbar puncture: Secondary | ICD-10-CM

## 2023-04-02 DIAGNOSIS — T7840XA Allergy, unspecified, initial encounter: Secondary | ICD-10-CM

## 2023-04-02 DIAGNOSIS — Z01810 Encounter for preprocedural cardiovascular examination: Secondary | ICD-10-CM

## 2023-04-02 DIAGNOSIS — J302 Other seasonal allergic rhinitis: Secondary | ICD-10-CM

## 2023-04-02 DIAGNOSIS — J189 Pneumonia, unspecified organism: Secondary | ICD-10-CM

## 2023-04-02 DIAGNOSIS — M797 Fibromyalgia: Secondary | ICD-10-CM

## 2023-04-02 DIAGNOSIS — K5900 Constipation, unspecified: Secondary | ICD-10-CM

## 2023-04-02 DIAGNOSIS — E6609 Other obesity due to excess calories: Secondary | ICD-10-CM

## 2023-04-02 DIAGNOSIS — G4733 Obstructive sleep apnea (adult) (pediatric): Secondary | ICD-10-CM

## 2023-04-02 DIAGNOSIS — G629 Polyneuropathy, unspecified: Secondary | ICD-10-CM

## 2023-04-02 DIAGNOSIS — E669 Obesity, unspecified: Secondary | ICD-10-CM

## 2023-04-02 DIAGNOSIS — M4802 Spinal stenosis, cervical region: Secondary | ICD-10-CM

## 2023-04-02 DIAGNOSIS — M79605 Pain in left leg: Secondary | ICD-10-CM

## 2023-04-02 DIAGNOSIS — Z86010 Personal history of colonic polyps: Secondary | ICD-10-CM

## 2023-04-02 DIAGNOSIS — R42 Dizziness and giddiness: Secondary | ICD-10-CM

## 2023-04-02 DIAGNOSIS — M629 Disorder of muscle, unspecified: Secondary | ICD-10-CM

## 2023-04-02 DIAGNOSIS — I8390 Asymptomatic varicose veins of unspecified lower extremity: Secondary | ICD-10-CM

## 2023-04-02 DIAGNOSIS — M5030 Other cervical disc degeneration, unspecified cervical region: Secondary | ICD-10-CM

## 2023-04-02 DIAGNOSIS — G2581 Restless legs syndrome: Secondary | ICD-10-CM

## 2023-04-02 DIAGNOSIS — G4730 Sleep apnea, unspecified: Secondary | ICD-10-CM

## 2023-04-02 DIAGNOSIS — I619 Nontraumatic intracerebral hemorrhage, unspecified: Secondary | ICD-10-CM

## 2023-04-02 DIAGNOSIS — R0683 Snoring: Secondary | ICD-10-CM

## 2023-04-02 DIAGNOSIS — E785 Hyperlipidemia, unspecified: Secondary | ICD-10-CM

## 2023-04-02 DIAGNOSIS — Z8679 Personal history of other diseases of the circulatory system: Secondary | ICD-10-CM

## 2023-04-02 DIAGNOSIS — F419 Anxiety disorder, unspecified: Secondary | ICD-10-CM

## 2023-04-02 DIAGNOSIS — M5020 Other cervical disc displacement, unspecified cervical region: Secondary | ICD-10-CM

## 2023-04-02 DIAGNOSIS — J45909 Unspecified asthma, uncomplicated: Secondary | ICD-10-CM

## 2023-04-02 DIAGNOSIS — M810 Age-related osteoporosis without current pathological fracture: Secondary | ICD-10-CM

## 2023-04-02 DIAGNOSIS — J439 Emphysema, unspecified: Secondary | ICD-10-CM

## 2023-04-02 DIAGNOSIS — K219 Gastro-esophageal reflux disease without esophagitis: Secondary | ICD-10-CM

## 2023-04-02 DIAGNOSIS — J4530 Mild persistent asthma, uncomplicated: Secondary | ICD-10-CM

## 2023-04-02 DIAGNOSIS — T148XXA Other injury of unspecified body region, initial encounter: Secondary | ICD-10-CM

## 2023-04-02 DIAGNOSIS — Z9989 Dependence on other enabling machines and devices: Secondary | ICD-10-CM

## 2023-04-02 DIAGNOSIS — Z981 Arthrodesis status: Secondary | ICD-10-CM

## 2023-04-02 DIAGNOSIS — N649 Disorder of breast, unspecified: Secondary | ICD-10-CM

## 2023-04-02 DIAGNOSIS — G5600 Carpal tunnel syndrome, unspecified upper limb: Secondary | ICD-10-CM

## 2023-04-02 DIAGNOSIS — R519 Generalized headaches: Secondary | ICD-10-CM

## 2023-04-02 LAB — LIPID PROFILE
CHOLESTEROL/HDL %: 4
TRIGLYCERIDES: 236 % — ABNORMAL HIGH (ref <150)
VLDL: 47 — ABNORMAL HIGH (ref 5–40)

## 2023-04-02 LAB — COMPREHENSIVE METABOLIC PANEL
ALBUMIN: 3.8
ALK PHOSPHATASE: 47
ALT: 23
ANION GAP: 8
AST: 25
BLD UREA NITROGEN: 24 — ABNORMAL HIGH (ref 9.8–20.1)
CREATININE: 0.9 — ABNORMAL HIGH (ref <100)
GFR ESTIMATED: 72
TOTAL BILIRUBIN: 0.2

## 2023-04-02 NOTE — Progress Notes
Date of Service: 04/02/2023    Kristen Vaughn is a 62 y.o. female.       HPI      Kristen Berton. 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    Patient was evaluated with a stress test in October 2022, it was unremarkable for ischemia, LVEF = 72%.    Patient was evaluated in the neurosurgical department at [facility], it is anticipated that she will undergo cervical spine surgery, perhaps later this year.    She does not have symptoms of compatible with angina, her blood pressure is under good control.    She does have hyperlipidemia and continues on rosuvastatin, a cholesterol profile dated 04/02/2023 revealed the following: TC = 233 mg/dL, TG = 161 mg/dL, HDL = 56 mg/dL, and LDL = 096 mg/dL.  Patient does take her cholesterol medication but she is not compliant with the low-fat, low-cholesterol diet.         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  Stopped (almost) late October 2019      Preoperative cardiovascular examination 10/24/2018    Osteoporosis without current pathological fracture 10/15/2017    Cervical disc herniation 04/17/2017    Stenosis of cervical spine 04/17/2017    Cervical myelopathy (HCC) 04/17/2017    Cervical radiculopathy 04/17/2017    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     05/17 regadenoson sestamibi MPI: EF 74%, no ischemia.           Review of Systems   Constitutional: Positive for malaise/fatigue.   HENT: Negative.     Eyes: Negative.    Cardiovascular:  Positive for dyspnea on exertion, irregular heartbeat and palpitations.   Respiratory: Negative.     Endocrine: Negative.    Hematologic/Lymphatic: Negative.    Skin: Negative.    Musculoskeletal:  Positive for back pain, muscle weakness and neck pain.   Gastrointestinal: Negative.    Genitourinary: Negative.    Neurological:  Positive for light-headedness and weakness.   Psychiatric/Behavioral: Negative.     Allergic/Immunologic: Negative.        Physical Exam  General Appearance: obese, BMI = 37.71 kg/m?, pale, patient appears chronically ill  Skin: warm, moist, no ulcers or xanthomas  Eyes: conjunctivae and lids normal, pupils are equal and round  Lips & Oral Mucosa: no pallor or cyanosis  Neck Veins: neck veins are flat, neck veins are not distended  Chest Inspection: chest is normal in appearance  Respiratory Effort: breathing comfortably, no respiratory distress  Auscultation/Percussion: lungs clear to auscultation, no rales or rhonchi, no wheezing  Cardiac Rhythm: regular rhythm and normal rate  Cardiac Auscultation: S1, S2 normal, no rub, no gallop  Murmurs: no murmur  Carotid Arteries: normal carotid upstroke bilaterally, no bruit  Abdominal aorta: could not be examined due to obese adomen  Lower Extremity Edema: no lower extremity edema  Abdominal Exam: soft, non-tender, no masses, bowel sounds normal  Liver & Spleen: no organomegaly  Language and Memory: patient responsive and seems to comprehend information  Neurologic Exam: neurological assessment grossly intact      Cardiovascular Studies  Twelve-lead ECG demonstrates normal sinus rhythm, sinus bradycardia, ventricular rate = 57 mmHg, IRBBB, no axis deviation    Cardiovascular Health Factors  Vitals BP Readings from Last 3 Encounters:   04/02/23 107/73   03/13/23 119/73   01/02/23 137/87     Wt Readings from Last 3 Encounters:   04/02/23 109.2 kg (240 lb 12.8 oz)   03/13/23 108.9 kg (240 lb)   01/02/23 108.9 kg (240 lb)     BMI Readings from Last 3 Encounters:   04/02/23 37.71 kg/m?   03/13/23 37.59 kg/m?   01/02/23 37.59 kg/m?      Smoking Social History     Tobacco Use   Smoking Status Former    Current packs/day: 0.00    Average packs/day: 0.3 packs/day for 40.0 years (10.0 ttl pk-yrs)    Types: Cigarettes    Quit date: 03/12/2023    Years since quitting: 0.0    Passive exposure: Current   Smokeless Tobacco Never   Tobacco Comments    variable 1-1/2 a day, usually 1 or 2 per day.      Lipid Profile Cholesterol   Date Value Ref Range Status   04/02/2023 233 (H) <200 Final     HDL   Date Value Ref Range Status   04/02/2023 56  Final     LDL   Date Value Ref Range Status   04/02/2023 130 (H) <100 Final     Triglycerides   Date Value Ref Range Status   04/02/2023 236 (H) <150 Final      Blood Sugar Hemoglobin A1C   Date Value Ref Range Status   10/06/2018 5.8 4.0 - 6.0 % Final     Comment:     The ADA recommends that most patients with type 1 and type 2 diabetes maintain   an A1c level <7%.       Glucose   Date Value Ref Range Status   04/02/2023 108 (H) 70 - 105 Final   01/08/2023 105  Final   03/23/2022 89  Final          Problems Addressed Today  Encounter Diagnoses   Name Primary?    Screening for heart disease Yes    Mixed hyperlipidemia     Tobacco abuse     Stenosis of cervical spine     Snoring  Seasonal allergies     S/P cervical spinal fusion     Restless legs syndrome (RLS)     Preoperative cardiovascular examination     Peripheral polyneuropathy     Pain of left lower extremity     OSA (obstructive sleep apnea)     Mild persistent asthma without complication     Class 2 obesity due to excess calories without serious comorbidity with body mass index (BMI) of 37.0 to 37.9 in adult     Cervical myelopathy (HCC)     Ambulates with cane        Assessment and Plan       Assessment:    1.  Preoperative cardiovascular evaluation preceding anticipated cervical spine surgery  2.  Primary hypertension-on lisinopril and under good control  3.  Mixed hyperlipidemia  Cholesterol profile dated 04/02/2023 did demonstrate increased total cholesterol, triglycerides and LDL  Patient does take rosuvastatin  She is not compliant with a low-fat, low-cholesterol and low carbohydrate diet  4.  Unremarkable.  Perfusion imaging study dated 07/06/2021-she did not demonstrate ischemia, LVEF = 72%  5.  Class II obesity due to excess calories, BMI = 37.71 kg/m?  6.  History of tobacco use  7.  Cervical myelopathy  8.  Peripheral polyneuropathy, generalized weakness and physical deconditioning-patient ambulates with a cane  9.  Probably malnutrition and possibly vitamin D3 deficient    Plan:    1.  Continue all current medications  2.  From a cardiac standpoint this patient does not have any contraindication to undergo general anesthesia and planned surgical intervention for her cervical spine.  Overall, from a cardiac standpoint she is a low risk, therefore risks and benefits of this procedure should be discussed with the anesthesiologist and the operating surgeon  3.  I suggest formal pulmonary evaluation preceding general anesthesia  4.  I suggested overall risk factors modifications, low-fat, low-cholesterol, low sugar, low carbohydrate diet  5.  Check vitamin D3 level and replace if not within range    Total Time Today was 40 minutes in the following activities: Preparing to see the patient, Obtaining and/or reviewing separately obtained history, Performing a medically appropriate examination and/or evaluation, Counseling and educating the patient/family/caregiver, Ordering medications, tests, or procedures, Referring and communication with other health care professionals (when not separately reported), Documenting clinical information in the electronic or other health record, Independently interpreting results (not separately reported) and communicating results to the patient/family/caregiver, and Care coordination (not separately reported)          Current Medications (including today's revisions)   acetaminophen (TYLENOL) 500 mg tablet Take two tablets by mouth every 6 hours as needed for Pain. Max of 4,000 mg of acetaminophen in 24 hours.    albuterol sulfate (PROAIR HFA) 90 mcg/actuation HFA aerosol inhaler Inhale two puffs by mouth into the lungs every 6 hours as needed for Wheezing or Shortness of Breath. Shake well before use.    calcium carbonate (CALCIUM 600 PO) Take 1 tablet by mouth daily.    celecoxib (CELEBREX) 200 mg capsule Take one capsule by mouth daily.    cetirizine (ZYRTEC) 10 mg tablet Take one tablet by mouth daily as needed for Allergy symptoms.    Cholecalciferol (Vitamin D3) (VITAMIN D-3) 2,000 unit cap Take two capsules by mouth daily. (Patient taking differently: Take one capsule by mouth daily.)    clonazePAM (KLONOPIN) 1 mg tablet Take one tablet by mouth daily as needed.    cyclobenzaprine (FLEXERIL) 5 mg  tablet Take one tablet by mouth three times daily as needed for Muscle Cramps.    denosumab (PROLIA) 60 mg/mL injection Inject 1 mL under the skin every 180 days.    desvenlafaxine succinate (PRISTIQ) 100 mg tablet Take one tablet by mouth daily.    diclofenac (VOLTAREN) 1 % topical gel Apply four g topically to affected area four times daily.    fish oil- omega 3-DHA/EPA 300/1,000 mg capsule Take one capsule by mouth daily.    fluticasone propionate (FLONASE ALLERGY RELIEF) 50 mcg/actuation nasal spray, suspension Apply two sprays to each nostril as directed daily.    fluticasone-salmeterol (ADVAIR DISKUS) 250-50 mcg/dose inhalation disk Inhale one puff by mouth into the lungs twice daily.    lisinopriL (ZESTRIL) 40 mg tablet Take one tablet by mouth daily. for high blood pressure    mirtazapine (REMERON) 15 mg tablet Take one tablet by mouth at bedtime daily.    mupirocin (BACTROBAN) 2 % topical ointment Apply  topically to affected area twice daily.    omeprazole DR(+) (PRILOSEC) 40 mg capsule Take one capsule by mouth daily before breakfast.    pregabalin (LYRICA) 200 mg capsule Take one capsule by mouth three times daily.    rosuvastatin (CRESTOR) 10 mg tablet Take one tablet by mouth daily.    terbinafine HCL (LAMISIL) 250 mg tablet Take one tablet by mouth daily.    varenicline (CHANTIX) 1 mg tablet Take one tablet by mouth twice daily.    walker (ULTRA-LIGHT ROLLATOR) medical supply Use as directed.

## 2023-04-05 NOTE — Progress Notes
Pt fell on 04/04/23 down 4 stairs. Pt saw PCP, xray completed but not read at this time. Pt states she will be here for appointment.

## 2023-04-08 ENCOUNTER — Encounter: Admit: 2023-04-08 | Discharge: 2023-04-08 | Payer: MEDICARE

## 2023-04-08 ENCOUNTER — Ambulatory Visit: Admit: 2023-04-08 | Discharge: 2023-04-08 | Payer: MEDICARE

## 2023-04-08 DIAGNOSIS — R519 Generalized headaches: Secondary | ICD-10-CM

## 2023-04-08 DIAGNOSIS — M5416 Radiculopathy, lumbar region: Secondary | ICD-10-CM

## 2023-04-08 DIAGNOSIS — K219 Gastro-esophageal reflux disease without esophagitis: Secondary | ICD-10-CM

## 2023-04-08 DIAGNOSIS — M5030 Other cervical disc degeneration, unspecified cervical region: Secondary | ICD-10-CM

## 2023-04-08 DIAGNOSIS — G971 Other reaction to spinal and lumbar puncture: Secondary | ICD-10-CM

## 2023-04-08 DIAGNOSIS — J189 Pneumonia, unspecified organism: Secondary | ICD-10-CM

## 2023-04-08 DIAGNOSIS — T7840XA Allergy, unspecified, initial encounter: Secondary | ICD-10-CM

## 2023-04-08 DIAGNOSIS — N649 Disorder of breast, unspecified: Secondary | ICD-10-CM

## 2023-04-08 DIAGNOSIS — G4730 Sleep apnea, unspecified: Secondary | ICD-10-CM

## 2023-04-08 DIAGNOSIS — F419 Anxiety disorder, unspecified: Secondary | ICD-10-CM

## 2023-04-08 DIAGNOSIS — J45909 Unspecified asthma, uncomplicated: Secondary | ICD-10-CM

## 2023-04-08 DIAGNOSIS — I8390 Asymptomatic varicose veins of unspecified lower extremity: Secondary | ICD-10-CM

## 2023-04-08 DIAGNOSIS — I619 Nontraumatic intracerebral hemorrhage, unspecified: Secondary | ICD-10-CM

## 2023-04-08 DIAGNOSIS — M629 Disorder of muscle, unspecified: Secondary | ICD-10-CM

## 2023-04-08 DIAGNOSIS — Z86010 Personal history of colonic polyps: Secondary | ICD-10-CM

## 2023-04-08 DIAGNOSIS — Z8614 Personal history of Methicillin resistant Staphylococcus aureus infection: Secondary | ICD-10-CM

## 2023-04-08 DIAGNOSIS — I10 Essential (primary) hypertension: Secondary | ICD-10-CM

## 2023-04-08 DIAGNOSIS — T148XXA Other injury of unspecified body region, initial encounter: Secondary | ICD-10-CM

## 2023-04-08 DIAGNOSIS — M4802 Spinal stenosis, cervical region: Secondary | ICD-10-CM

## 2023-04-08 DIAGNOSIS — G959 Disease of spinal cord, unspecified: Secondary | ICD-10-CM

## 2023-04-08 DIAGNOSIS — E785 Hyperlipidemia, unspecified: Secondary | ICD-10-CM

## 2023-04-08 DIAGNOSIS — M797 Fibromyalgia: Secondary | ICD-10-CM

## 2023-04-08 DIAGNOSIS — M1990 Unspecified osteoarthritis, unspecified site: Secondary | ICD-10-CM

## 2023-04-08 DIAGNOSIS — J439 Emphysema, unspecified: Secondary | ICD-10-CM

## 2023-04-08 DIAGNOSIS — K5900 Constipation, unspecified: Secondary | ICD-10-CM

## 2023-04-08 DIAGNOSIS — M4800 Spinal stenosis, site unspecified: Secondary | ICD-10-CM

## 2023-04-08 DIAGNOSIS — Z8679 Personal history of other diseases of the circulatory system: Secondary | ICD-10-CM

## 2023-04-08 DIAGNOSIS — M5020 Other cervical disc displacement, unspecified cervical region: Secondary | ICD-10-CM

## 2023-04-08 DIAGNOSIS — E669 Obesity, unspecified: Secondary | ICD-10-CM

## 2023-04-08 DIAGNOSIS — R42 Dizziness and giddiness: Secondary | ICD-10-CM

## 2023-04-08 DIAGNOSIS — G5600 Carpal tunnel syndrome, unspecified upper limb: Secondary | ICD-10-CM

## 2023-04-08 DIAGNOSIS — M810 Age-related osteoporosis without current pathological fracture: Secondary | ICD-10-CM

## 2023-04-08 DIAGNOSIS — M2550 Pain in unspecified joint: Secondary | ICD-10-CM

## 2023-04-08 MED ORDER — LIDOCAINE (PF) 10 MG/ML (1 %) IJ SOLN
5 mL | Freq: Once | INTRAMUSCULAR | 0 refills | Status: CP
Start: 2023-04-08 — End: ?

## 2023-04-08 MED ORDER — SODIUM CHLORIDE 0.9 % IJ SOLN
3 mL | Freq: Once | INTRAMUSCULAR | 0 refills | Status: CP
Start: 2023-04-08 — End: ?

## 2023-04-08 MED ORDER — DIAZEPAM 5 MG PO TAB
5 mg | Freq: Once | ORAL | 0 refills | Status: AC
Start: 2023-04-08 — End: ?

## 2023-04-08 MED ORDER — TRIAMCINOLONE ACETONIDE 40 MG/ML IJ SUSP
80 mg | Freq: Once | EPIDURAL | 0 refills | Status: CP
Start: 2023-04-08 — End: ?

## 2023-04-08 MED ORDER — IOHEXOL 300 MG IODINE/ML IV SOLN
1 mL | Freq: Once | 0 refills | Status: CP
Start: 2023-04-08 — End: ?

## 2023-04-08 NOTE — Procedures
Attending Surgeon: [name], MD    Anesthesia: Local    Pre-Procedure Diagnosis:   1. Lumbar radiculopathy        Post-Procedure Diagnosis:   1. Lumbar radiculopathy             [place] [facility] SP[facility] INJECT INTERLAM LMBR/SAC  Procedure: epidural - interlaminar    Laterality: n/a   on 04/08/2023 1:05 PM  Location: caudal - Caudal      Consent:   Consent obtained: verbal and written  Consent given by: patient  Risks discussed: allergic reaction, bleeding, bruising, infection, nerve damage, no change or worsening in pain, reaction to medication and weakness    Discussed with patient the purpose of the treatment/procedure, other ways of treating my condition, including no treatment/ procedure and the risks and benefits of the alternatives. Patient has decided to proceed with treatment/procedure.        Universal Protocol:  Relevant documents: relevant documents present and verified  Test results: test results available and properly labeled  Imaging studies: imaging studies available  Required items: required blood products, implants, devices, and special equipment available  Site marked: the operative site was marked  Patient identity confirmed: Patient identify confirmed verbally with patient.        Time out: Immediately prior to procedure a time out was called to verify the correct patient, procedure, equipment, support staff and site/side marked as required      Procedures Details:   Indications: pain   Prep: chlorhexidine  Patient position: prone  Estimated Blood Loss: minimal  Specimens: none  Number of Levels: 1  Approach: midline  Guidance: fluoroscopy  Contrast: Procedure confirmed with contrast under live fluoroscopy.  Needle and Epidural Catheter: quincke  Needle size: 22 G  Injection procedure: Incremental injection and Negative aspiration for blood  Patient tolerance: Patient tolerated the procedure well with no immediate complications. Pressure was applied, and hemostasis was accomplished.  Comments: DESCRIPTION OF PROCEDURE:  The procedure risks and benefits were explained and informed consent was obtained from the patient.  The patient was positioned prone on the fluoroscopy table with a pillow under the abdomen.  Blood pressure cuff and oxygen saturation monitors were attached.  The patient was monitored throughout the procedure.  The sacral hiatus was identified with the use of fluoroscopy in the lateral view.  The skin was prepped with chlorhexidine and draped in aseptic fashion.  The skin and subcutaneous tissue were anesthetized using 4 mL of 1 percent lidocaine with a 27-gauge needle.  A 3.5-inch, 22-gauge needle was slowly advanced in the lateral view towards the sacral hiatus.  Once the needle was in position, 0.6 mL of contrast was injected and demonstrated epidural spread.  After negative aspiration, a 6 mL solution containing 2 mL of normal saline and 2 mL of 1 percent lidocaine and 80 mg of Kenalog was injected in increments.  The stylet was reinserted and needle was removed.  After the procedure, the patient's blood pressure, heart rate, oxygen saturation, and VAS were recorded in the chart.     There were no complications. The patient tolerated procedure well and was brought to the recovery room for observation in stable condition with discharge instructions     PLAN OF CARE:  The patient follow up in clinic in 3 weeks.     The patient was advised to contact the Spine Center for any of the following.  Fever, chills, or night sweats.  New onset of severe sharp pain.  Any  new upper or lower extremity weakness or numbness.  Any questions regarding the procedure.     If unable to contact the [facility], the patient was instructed to go to local emergency room.         This patient's clinical history, exam, AND imaging support radiculopathy AND there is a significant impact on quality of life and function AND the pain has been present for at least 4 weeks AND they have failed to improve with noninvasive conservative care.  This patient had at least 50% pain relief for at least 3 months with the last epidural injection.    This patient's pain is so severe it results in a significant degree of disability. Prior ESI has provided at least a 50% improvement in pain and function for at least 3 months. The patient's Primary Care Physician has been notified of the continuation of this procedure and prolonged repeat steroid use. The patient does not desire surgery.  Administrations This Visit       iohexoL (OMNIPAQUE-300) 300 mg/mL injection 1 mL       Admin Date  04/08/2023 Action  Given Dose  1 mL Route  SEE ADMIN INSTRUCTIONS Documented By  [name], MD              lidocaine PF 1% (10 mg/mL) injection 5 mL       Admin Date  04/08/2023 Action  Given Dose  5 mL Route  Injection Documented By  [name], MD              sodium chloride PF 0.9% injection 3 mL       Admin Date  04/08/2023 Action  Given Dose  3 mL Route  Injection Documented By  [name], MD              triamcinolone acetonide [facility]) injection 80 mg       Admin Date  04/08/2023 Action  Given Dose  80 mg Route  Epidural Documented By  [name], MD                  Estimated blood loss: none or minimal  Specimens: none  Patient tolerated the procedure well with no immediate complications. Pressure was applied, and hemostasis was accomplished.

## 2023-04-08 NOTE — Progress Notes
SPINE CENTER  INTERVENTIONAL PAIN PROCEDURE HISTORY AND PHYSICAL      Chief Complaint: Pain    HISTORY OF PRESENT ILLNESS:    Kristen Vaughn is a 62 y.o. year old female who presents for for interventional treatment of radicular pain. Patient denies any recent fevers, chills, infection, antibiotics, coagulopathy or contra-indicated anticoagulants. Risks of the procedure were discussed including but not limited to bleeding, infection, damage to surrounding structures (including muscles, blood vessels, nerve roots, spinal cord)  and reaction to medications. Patient reports understanding and has elected to proceed with the procedure.  This patient's clinical history, exam, AND imaging support radiculopathy AND there is a significant impact on quality of life and function AND their pain score has been documented in this note AND the pain has been present for at least 4 weeks AND they have failed to improve with noninvasive conservative care. This patient had at least 50% pain relief for at least 3 months with the last epidural injection.     Past Medical History:   Diagnosis Date    Allergy     Anxiety and depression     Arthritis     Asthma     Brain bleed [facility]) 2023    [facility], [place] well    Breast disorder     Carpal tunnel syndrome     Cervical myelopathy (HCC)     Cervical stenosis of spine     Chest pain     Constipation     Degenerative disc disease, cervical     Dizziness 2023    Essential hypertension     Fibromyalgia     Generalized headaches     GERD (gastroesophageal reflux disease)     Herniated disc, cervical     History of abnormal electrocardiogram 01/2016    History of colon polyps 2015    History of MRSA infection 2019    right hand/[facility]    Hyperlipemia 08/09/2009    Joint pain     Muscle disease or syndrome     Fibromyalgia    Nerve injury     Obesity     Osteoporosis     Pneumonia     Pulmonary emphysema (HCC)     Sleep apnea     Spinal headache     Spinal stenosis Varicose veins        Surgical History:   Procedure Laterality Date    HX HYSTERECTOMY  1985    UPPER GASTROINTESTINAL ENDOSCOPY  2015    COLONOSCOPY  2015    hx colon polyps x2    CARDIOVASCULAR STRESS TEST  01/2016    FINGER TRIGGER RELEASE Right 2019    CARPAL TUNNEL RELEASE Right 2019    ANTERIOR CERVICAL DECOMPRESSION AND FUSION AT CERVICAL 6-7 N/A 10/28/2018    Performed by Criss Rosales, MD at [facility] [place]    ARTHRODESIS SPINE ANTERIOR INTERBODY WITH DISCECTOMY/ OSTEOPHYTECTOMY/ DECOMPRESSION - CERVICAL BELOW C2 - EACH ADDITIONAL INTERSPACE N/A 10/28/2018    Performed by Criss Rosales, MD at [facility] OR    ANTERIOR INSTRUMENTATION - 2 TO 3 VERTEBRAL SEGMENTS N/A 10/28/2018    Performed by Criss Rosales, MD at [facility] OR    ABDOMEN SURGERY      DILATION AND CURETTAGE  1980's    HX TUBAL LIGATION      LAP CHOLECYSTECTOMY      SPINE SURGERY      TILT TABLE STUDY  family history includes Alcohol liver disease in her brother; Arthritis in her father; Asthma in her sister; Back pain in her brother; Cancer in her sister, sister and another family member; Coronary Artery Disease in her father, mother, and other family members; Diabetes in her brother and mother; Hypertension in her father; Joint Pain in her sister; Neck Pain in her sister; Osteoporosis in her sister; Premature Heart Disease in an other family member; Stroke in an other family member.    Social History     Socioeconomic History    Marital status: Single   Tobacco Use    Smoking status: Former     Current packs/day: 0.00     Average packs/day: 0.3 packs/day for 40.0 years (10.0 ttl pk-yrs)     Types: Cigarettes     Quit date: 03/12/2023     Years since quitting: 0.0     Passive exposure: Current    Smokeless tobacco: Never    Tobacco comments:     variable 1-1/2 a day, usually 1 or 2 per day.   Substance and Sexual Activity    Alcohol use: Not Currently     Alcohol/week: 2.0 standard drinks of alcohol     Types: 2 Cans of beer per week     Comment: social Drug use: Not Currently     Types: Marijuana     Comment: Gummies THC    Sexual activity: Not Currently     Partners: Male     Birth control/protection: Condom, None       Allergies   Allergen Reactions    Erythromycin RASH     Rash on trunk    Strawberry RASH    Ascorbic Acid NAUSEA ONLY and UNKNOWN       There were no vitals filed for this visit.     Oswestry Total Score:: 72    REVIEW OF SYSTEMS: 10 point ROS obtained and negative except as stated above      PHYSICAL EXAM:  General: Alert, cooperative, no acute distress.  HEENT: Normocephalic, atraumatic.  Neck: Supple.  Lungs: Unlabored respirations, bilateral and equal chest excursion.  Heart: Regular rate.  Skin: Warm and dry to touch.  Abdomen: Nondistended.  MSK: No deformity  Neurological: Alert and oriented x3.          IMPRESSION:    1. Lumbar radiculopathy         PLAN:   Caudal ESI

## 2023-04-08 NOTE — Discharge Instructions - Supplementary Instructions
GENERAL POST PROCEDURE INSTRUCTIONS  Physician: _________________________________  Procedure Completed Today:  Joint Injection (hip, knee, shoulder)  Cervical Epidural Steroid Injection  Cervical Transforaminal Steroid Injection  Trigger Point Injection  Caudal Epidural Steroid Injection  Piriformis Injection  Pudendal Nerve Block  Other _____________________ Thoracic Epidural Steroid Injection  Lumbar Epidural Steroid Injection  Lumbar Transforaminal Steroid Injection  Facet Joint Injection  Celiac Nerve Block  Sacrococcygeal  Sacroiliac Joint Injection   Important information following your procedure today:  You may drive today     If you had sedation, you may NOT drive today  Rest at home for the next 6 hours.  You may then begin to resume your normal activities.  DO NOT drive any vehicle, operate any power tools, drink alcohol, make any major decisions, or sign any legal documents for the next 12 hours.  Pain relief may not be immediate. It is possible you may even experience an increase in pain during the first 24-48 hours followed by a gradual decrease of your pain.  Though the procedure is generally safe, and complications are rare, we do ask that you be aware of any of the following:  Any swelling, persistent redness, new bleeding or drainage from the site of the injection.  You should not experience a severe headache.  You should not run a fever over 101oF.  New onset of sharp, severe back and or neck pain.  New onset of upper or lower extremity numbness or weakness.  New difficulty controlling bowel or bladder function after injection.  New shortness of breath.  ** If any of these occur, please call to report this occurrence to the nurse of Dr. Smith at [phone]. If you are calling after 4:00 p.m. or on weekends or holidays, please call [phone] and ask to have the resident physician on call for the physician paged or go to your local emergency room.  You may experience soreness at the injection site. Ice can be applied at 20-minute intervals for the first 24 hours. The following day you may alternate ice with heat if you are experiencing muscle tightness, otherwise continue with ice. Ice works best at decreasing pain. Avoid application of direct heat, hot showers or hot tubs today.  Avoid strenuous activity today. You many resume your regular activities and exercise tomorrow.  Patients with diabetes may see an elevation in blood sugars for 7-10 days after the injection. It is important to pay close attention to your diet, check your blood sugars daily and report extreme elevations to the physician that manages your diabetes.  Patients taking daily blood thinners can resume their regular dose this evening.  It is important that you take all medications ordered by your pain physician. Taking medications as ordered is an important part of your pain care plan. If you cannot continue the medication plan, please notify the physician.    Possible side effects to steroids that may occur:  Flushing or redness of the face  Irritability  Fluid retention  Change in women's menses  Minor headache    If you are unable to keep your upcoming appointment, please notify the [facility] scheduler at [phone] at least 24 hours in advance. If you have questions for the surgery center, call [facility] at [phone].

## 2023-04-17 NOTE — Patient Instructions
It was a pleasure seeing you in clinic today.    Jakota Manthei BSN, RN  Clinical Nurse Coordinator  Dr. Joshua T. Bunch  [facility]  [facility]  [address]  [place], [place] [address]  Phone: [phone]  Fax [phone]  Scheduling [phone]  [email or web address]    We appreciate the interest in your health.  The physician reviews both the radiology reports and the imaging independently and will contact you if there are any emergent findings.  If you would like, incidental and chronic changes seen in your images can be reviewed with Dr. Bunch at a follow up appointment.  At that appointment, the radiology report and terminology used in that report can also be explained. Please call our Schedule line if you would like to follow up.

## 2023-04-30 ENCOUNTER — Encounter: Admit: 2023-04-30 | Discharge: 2023-04-30 | Payer: MEDICARE

## 2023-04-30 DIAGNOSIS — M4802 Spinal stenosis, cervical region: Secondary | ICD-10-CM

## 2023-05-01 ENCOUNTER — Ambulatory Visit: Admit: 2023-05-01 | Discharge: 2023-05-01 | Payer: MEDICARE

## 2023-05-01 ENCOUNTER — Encounter: Admit: 2023-05-01 | Discharge: 2023-05-01 | Payer: MEDICARE

## 2023-05-01 DIAGNOSIS — M5412 Radiculopathy, cervical region: Secondary | ICD-10-CM

## 2023-05-01 DIAGNOSIS — M797 Fibromyalgia: Secondary | ICD-10-CM

## 2023-05-01 DIAGNOSIS — G971 Other reaction to spinal and lumbar puncture: Secondary | ICD-10-CM

## 2023-05-01 DIAGNOSIS — S129XXA Fracture of neck, unspecified, initial encounter: Secondary | ICD-10-CM

## 2023-05-01 DIAGNOSIS — T148XXA Other injury of unspecified body region, initial encounter: Secondary | ICD-10-CM

## 2023-05-01 DIAGNOSIS — E669 Obesity, unspecified: Secondary | ICD-10-CM

## 2023-05-01 DIAGNOSIS — M5030 Other cervical disc degeneration, unspecified cervical region: Secondary | ICD-10-CM

## 2023-05-01 DIAGNOSIS — N649 Disorder of breast, unspecified: Secondary | ICD-10-CM

## 2023-05-01 DIAGNOSIS — J189 Pneumonia, unspecified organism: Secondary | ICD-10-CM

## 2023-05-01 DIAGNOSIS — G5600 Carpal tunnel syndrome, unspecified upper limb: Secondary | ICD-10-CM

## 2023-05-01 DIAGNOSIS — M4802 Spinal stenosis, cervical region: Secondary | ICD-10-CM

## 2023-05-01 DIAGNOSIS — I8390 Asymptomatic varicose veins of unspecified lower extremity: Secondary | ICD-10-CM

## 2023-05-01 DIAGNOSIS — E785 Hyperlipidemia, unspecified: Secondary | ICD-10-CM

## 2023-05-01 DIAGNOSIS — M5020 Other cervical disc displacement, unspecified cervical region: Secondary | ICD-10-CM

## 2023-05-01 DIAGNOSIS — G894 Chronic pain syndrome: Secondary | ICD-10-CM

## 2023-05-01 DIAGNOSIS — R519 Generalized headaches: Secondary | ICD-10-CM

## 2023-05-01 DIAGNOSIS — I619 Nontraumatic intracerebral hemorrhage, unspecified: Secondary | ICD-10-CM

## 2023-05-01 DIAGNOSIS — Z8679 Personal history of other diseases of the circulatory system: Secondary | ICD-10-CM

## 2023-05-01 DIAGNOSIS — M629 Disorder of muscle, unspecified: Secondary | ICD-10-CM

## 2023-05-01 DIAGNOSIS — R42 Dizziness and giddiness: Secondary | ICD-10-CM

## 2023-05-01 DIAGNOSIS — K219 Gastro-esophageal reflux disease without esophagitis: Secondary | ICD-10-CM

## 2023-05-01 DIAGNOSIS — G4730 Sleep apnea, unspecified: Secondary | ICD-10-CM

## 2023-05-01 DIAGNOSIS — M2550 Pain in unspecified joint: Secondary | ICD-10-CM

## 2023-05-01 DIAGNOSIS — F419 Anxiety disorder, unspecified: Secondary | ICD-10-CM

## 2023-05-01 DIAGNOSIS — M1990 Unspecified osteoarthritis, unspecified site: Secondary | ICD-10-CM

## 2023-05-01 DIAGNOSIS — T7840XA Allergy, unspecified, initial encounter: Secondary | ICD-10-CM

## 2023-05-01 DIAGNOSIS — Z981 Arthrodesis status: Secondary | ICD-10-CM

## 2023-05-01 DIAGNOSIS — J439 Emphysema, unspecified: Secondary | ICD-10-CM

## 2023-05-01 DIAGNOSIS — Z86010 Personal history of colonic polyps: Secondary | ICD-10-CM

## 2023-05-01 DIAGNOSIS — G959 Disease of spinal cord, unspecified: Secondary | ICD-10-CM

## 2023-05-01 DIAGNOSIS — M810 Age-related osteoporosis without current pathological fracture: Secondary | ICD-10-CM

## 2023-05-01 DIAGNOSIS — Z8614 Personal history of Methicillin resistant Staphylococcus aureus infection: Secondary | ICD-10-CM

## 2023-05-01 DIAGNOSIS — K5900 Constipation, unspecified: Secondary | ICD-10-CM

## 2023-05-01 DIAGNOSIS — J45909 Unspecified asthma, uncomplicated: Secondary | ICD-10-CM

## 2023-05-01 DIAGNOSIS — I10 Essential (primary) hypertension: Secondary | ICD-10-CM

## 2023-05-01 DIAGNOSIS — M4800 Spinal stenosis, site unspecified: Secondary | ICD-10-CM

## 2023-05-13 ENCOUNTER — Encounter: Admit: 2023-05-13 | Discharge: 2023-05-13 | Payer: MEDICARE

## 2023-05-13 DIAGNOSIS — M5030 Other cervical disc degeneration, unspecified cervical region: Secondary | ICD-10-CM

## 2023-05-13 DIAGNOSIS — S129XXA Fracture of neck, unspecified, initial encounter: Secondary | ICD-10-CM

## 2023-05-13 DIAGNOSIS — M4802 Spinal stenosis, cervical region: Secondary | ICD-10-CM

## 2023-05-13 DIAGNOSIS — Z981 Arthrodesis status: Secondary | ICD-10-CM

## 2023-05-13 DIAGNOSIS — M5412 Radiculopathy, cervical region: Secondary | ICD-10-CM

## 2023-05-13 NOTE — Telephone Encounter
Patient Kristen Vaughn wanting to schedule surgery. RN informed patient she will need an MRI and CT cervical spine prior to surgery. Orders placed. Radiology scheduling number provided. RN told patient I will discuss with Dr. Liam Rogers this week and call her back for a surgery date.

## 2023-05-17 ENCOUNTER — Encounter: Admit: 2023-05-17 | Discharge: 2023-05-17 | Payer: MEDICARE

## 2023-05-21 ENCOUNTER — Encounter: Admit: 2023-05-21 | Discharge: 2023-05-21 | Payer: MEDICARE

## 2023-05-21 NOTE — Telephone Encounter
RN LVM to coordinate a surgery date. Left our number to call back.

## 2023-05-23 ENCOUNTER — Encounter: Admit: 2023-05-23 | Discharge: 2023-05-23 | Payer: MEDICARE

## 2023-05-23 DIAGNOSIS — M5412 Radiculopathy, cervical region: Secondary | ICD-10-CM

## 2023-05-23 DIAGNOSIS — Z419 Encounter for procedure for purposes other than remedying health state, unspecified: Secondary | ICD-10-CM

## 2023-05-23 DIAGNOSIS — Z01818 Encounter for other preprocedural examination: Secondary | ICD-10-CM

## 2023-05-23 DIAGNOSIS — M4802 Spinal stenosis, cervical region: Secondary | ICD-10-CM

## 2023-05-23 DIAGNOSIS — S129XXA Fracture of neck, unspecified, initial encounter: Secondary | ICD-10-CM

## 2023-05-23 MED ORDER — LACTATED RINGERS IV SOLP
1000 mL | INTRAVENOUS | 0 refills
Start: 2023-05-23 — End: ?

## 2023-05-23 MED ORDER — PRESURGERY KIT B
PACK | 0 refills | Status: AC
Start: 2023-05-23 — End: ?
  Filled 2023-05-27: qty 1, 1d supply, fill #1

## 2023-05-23 MED ORDER — CEFAZOLIN 2 GRAM IV SOLR
2 g | Freq: Once | INTRAVENOUS | 0 refills
Start: 2023-05-23 — End: ?

## 2023-05-23 MED ORDER — CELECOXIB 200 MG PO CAP
200 mg | Freq: Once | ORAL | 0 refills
Start: 2023-05-23 — End: ?

## 2023-05-23 MED ORDER — ACETAMINOPHEN 500 MG PO TAB
1000 mg | Freq: Once | ORAL | 0 refills
Start: 2023-05-23 — End: ?

## 2023-05-23 NOTE — Telephone Encounter
Surgery scheduled for 10/22 at [facility]. Pre-op, PAC, and post-op appointments scheduled. Patient will have MRI and CT done on 9/6.

## 2023-05-26 ENCOUNTER — Encounter: Admit: 2023-05-26 | Discharge: 2023-05-26 | Payer: MEDICARE

## 2023-05-28 ENCOUNTER — Encounter: Admit: 2023-05-28 | Discharge: 2023-05-28 | Payer: MEDICARE

## 2023-05-31 ENCOUNTER — Encounter: Admit: 2023-05-31 | Discharge: 2023-05-31 | Payer: MEDICARE

## 2023-05-31 ENCOUNTER — Ambulatory Visit: Admit: 2023-05-31 | Discharge: 2023-05-31 | Payer: MEDICARE

## 2023-05-31 DIAGNOSIS — Z981 Arthrodesis status: Secondary | ICD-10-CM

## 2023-05-31 DIAGNOSIS — S129XXA Fracture of neck, unspecified, initial encounter: Secondary | ICD-10-CM

## 2023-05-31 DIAGNOSIS — M5030 Other cervical disc degeneration, unspecified cervical region: Secondary | ICD-10-CM

## 2023-05-31 DIAGNOSIS — M4802 Spinal stenosis, cervical region: Secondary | ICD-10-CM

## 2023-05-31 DIAGNOSIS — M5412 Radiculopathy, cervical region: Secondary | ICD-10-CM

## 2023-05-31 MED ORDER — GADOBENATE DIMEGLUMINE 529 MG/ML (0.1MMOL/0.2ML) IV SOLN
20 mL | Freq: Once | INTRAVENOUS | 0 refills | Status: CP
Start: 2023-05-31 — End: ?
  Administered 2023-05-31: 21:00:00 20 mL via INTRAVENOUS

## 2023-06-13 ENCOUNTER — Encounter: Admit: 2023-06-13 | Discharge: 2023-06-13 | Payer: MEDICARE

## 2023-06-13 DIAGNOSIS — M7918 Myalgia, other site: Secondary | ICD-10-CM

## 2023-06-13 DIAGNOSIS — M47816 Spondylosis without myelopathy or radiculopathy, lumbar region: Secondary | ICD-10-CM

## 2023-06-13 DIAGNOSIS — M5416 Radiculopathy, lumbar region: Secondary | ICD-10-CM

## 2023-06-13 MED ORDER — LIDOCAINE-PRILOCAINE 2.5-2.5 % TP CREA
TOPICAL | 2 refills | Status: AC | PRN
Start: 2023-06-13 — End: ?

## 2023-06-13 NOTE — Telephone Encounter
Pharmacy requesting refill of EMLA  LOV: 03/13/23  NOV: none  Lab: na    Filled per protocol

## 2023-06-13 NOTE — Telephone Encounter
Patient called and states that she is scheduled for a spine procedure with Dr Liam Rogers on 10/22 and patient is inquiring if she should get a spinal injection prior to surgery.  Patient would also like a refill of EMLA cream sent to her local pharmacy.    Patient called back by this RN. Patient notified that EMLA has been sent to her local pharmacy.  Patient had inquired about getting a caudal epidural prior to her surgery. This RN informed the patient that this RN has reached out to Dr Patria Mane RN and she states that the patient is having invasive surgery and that no steroid procedures should be done prior to surgery, that patient should have to wait until she is cleared, after surgery.  Patient expresses understanding.

## 2023-06-25 ENCOUNTER — Encounter: Admit: 2023-06-25 | Discharge: 2023-06-25 | Payer: MEDICARE

## 2023-06-25 NOTE — Telephone Encounter
Patient LVM stating her PCP did not clear her for surgery and does not want her to have surgery or anesthesia yet due to her recent hospital admission. Patient would like to reschedule surgery for late November. Surgery rescheduled for 11/27. Pre-op, PAC, and post-op appointments rescheduled.

## 2023-06-26 ENCOUNTER — Encounter: Admit: 2023-06-26 | Discharge: 2023-06-26 | Payer: MEDICARE

## 2023-06-26 NOTE — Telephone Encounter
An encounter has been created for documentation only (often for preparation of an upcoming appointment or for follow up on orders/imaging) and patient does not need contact RN and did not miss a phone call or appointment.     RN prepped patient information for clinic with Christie Beckers APRN NP on 08/12/23 at 1300 via in person appt.     Here for 6 mo follow up  Dx:OSA, RLS  Pt known to sleep department by Dr. Clarene Critchley    Last SS: 08/26/22 with AHI 12.9  Previous cpap 8-18     LV: 02/20/23 with Dr Clarene Critchley     RLS  Meds:lyrica    Pt to ER on 05/15/23 for abscess to right groin- discharged same day    Pt to ER on 05/27/23 and inpt till 05/29/23 by EMS for confusion- in a disheveled state-hx of meth abuse.  Found to have acute metabolic encephalopathy     DME Marcelino Freestone  RN obtained download from [place]

## 2023-07-04 ENCOUNTER — Encounter: Admit: 2023-07-04 | Discharge: 2023-07-04 | Payer: MEDICARE

## 2023-07-04 MED ORDER — CYCLOBENZAPRINE 5 MG PO TAB
5 mg | ORAL_TABLET | Freq: Three times a day (TID) | ORAL | 3 refills | PRN
Start: 2023-07-04 — End: ?

## 2023-07-04 MED ORDER — ROSUVASTATIN 10 MG PO TAB
10 mg | ORAL_TABLET | Freq: Every day | ORAL | 3 refills | 90.00000 days | Status: AC
Start: 2023-07-04 — End: ?

## 2023-07-31 ENCOUNTER — Ambulatory Visit: Admit: 2023-07-31 | Discharge: 2023-07-31 | Payer: MEDICARE

## 2023-07-31 ENCOUNTER — Encounter: Admit: 2023-07-31 | Discharge: 2023-07-31 | Payer: MEDICARE

## 2023-07-31 ENCOUNTER — Inpatient Hospital Stay: Admit: 2023-07-31 | Discharge: 2023-07-31 | Payer: MEDICARE

## 2023-07-31 ENCOUNTER — Ambulatory Visit: Admit: 2023-07-31 | Discharge: 2023-08-01 | Payer: MEDICARE

## 2023-08-05 ENCOUNTER — Encounter: Admit: 2023-08-05 | Discharge: 2023-08-05 | Payer: MEDICARE

## 2023-08-05 NOTE — Telephone Encounter
Patient LVM asking about restrictions and how long she will be in the neck brace. RN called back pt and answered all questions.

## 2023-08-06 ENCOUNTER — Encounter: Admit: 2023-08-06 | Discharge: 2023-08-06 | Payer: MEDICARE

## 2023-08-06 ENCOUNTER — Ambulatory Visit: Admit: 2023-08-06 | Discharge: 2023-08-07 | Payer: MEDICARE

## 2023-08-08 NOTE — Progress Notes
Date of Service: 08/12/2023    Subjective:             Kristen Vaughn is a 62 y.o. female.    History of Present Illness  I had the pleasure of seeing Kristen Vaughn in clinic for OSA follow-up. She is known to Kristen Vaughn and was last seen 01/2023. She has mild OSA (AHI 12) and is on CPAP 5-15. Noc ox normal   Wearing hybrid full face mask, doesn't seem to fit right, squishes her face  Pressure is comfortable  Falls asleep without the mask  Denies shortness of breath, gasping for air, chest pain   Getting supples ok, no sure when she can get certain supplies     Smoking a couple of cigarettes a day, on chantix   History of asthma- on Wixela   Normal PFT's 10/2021   Does believe she is enrolled in LDCT lung cancer screening through her PCP- last one over the summer and [facility] in [place], [place]          08/11/2023    10:45 AM 07/13/2022     8:40 AM 05/09/2022     2:38 PM   Epworth Sleepiness Scale   Sitting and reading 0 0 3   Watching TV 0 0 2   Sitting inactive in a public place (e.g. a theater or a meeting) 0 0 2   As a passenger in a car for an hour without a break 0 0 2   Lying down to rest in the afternoon when circumstances permit 1 0 2   Sitting and talking to someone 0 0 1   Sitting quietly after a lunch without alcohol 0 2 2   In a car, while stopped for a few minutes in traffic 0 0 0   Epworth Sleepiness Scale Score 1 2 14                    Objective:         acetaminophen SR (TYLENOL 8 HOUR) 650 mg tablet Take one tablet by mouth every 8 hours as needed for Pain.    albuterol sulfate (PROAIR HFA) 90 mcg/actuation HFA aerosol inhaler Inhale two puffs by mouth into the lungs every 6 hours as needed for Wheezing or Shortness of Breath. Shake well before use.    calcium carbonate (CALCIUM 600 PO) Take 1 tablet by mouth daily.    celecoxib (CELEBREX) 200 mg capsule Take one capsule by mouth daily.    chlorhexidine gluconate (PERIDEX) 0.12 % mouthwash Swish and Spit 15 mL by mouth as directed twice daily. CHOLEcalciferoL (vitamin D3) 50 mcg (2,000 unit) tablet Take one tablet by mouth daily.    clonazePAM (KLONOPIN) 1 mg tablet Take one tablet by mouth daily as needed.    cyanocobalamin (vitamin B-12) 1,000 mcg tablet Take one tablet by mouth daily.    cyclobenzaprine (FLEXERIL) 5 mg tablet TAKE ONE TABLET BY MOUTH THREE TIMES DAILY AS NEEDED FOR MUSCLE CRAMPS.    denosumab (PROLIA) 60 mg/mL injection Inject 1 mL under the skin every 180 days.    diclofenac (VOLTAREN) 1 % topical gel Apply four g topically to affected area four times daily as needed.    FLUoxetine (PROZAC) 40 mg capsule Take 20 mg by mouth daily.    fluticasone propionate (FLONASE ALLERGY RELIEF) 50 mcg/actuation nasal spray, suspension Apply two sprays to each nostril as directed daily.    fluticasone-salmeterol (ADVAIR DISKUS) 250-50 mcg/dose inhalation disk Inhale one puff  by mouth into the lungs twice daily.    lidocaine (BLUE-EMU LIDOCAINE PATCH) 4 % topical patch Apply one patch topically to affected area daily.    lidocaine/prilocaine (EMLA) 2.5/2.5 % topical cream Apply  topically to affected area as Needed.    lisinopriL (ZESTRIL) 40 mg tablet Take one tablet by mouth daily. for high blood pressure    Magnesium 250 mg tab Take one tablet by mouth daily.    mirtazapine (REMERON) 30 mg tablet Take one tablet by mouth at bedtime daily.    multivitamin (ONE-A-DAY) tablet Take one tablet by mouth daily.    mupirocin (BACTROBAN) 2 % topical ointment Apply  topically to affected area twice daily.    omeprazole DR(+) (PRILOSEC) 40 mg capsule Take one capsule by mouth twice daily.    pregabalin (LYRICA) 200 mg capsule Take one capsule by mouth three times daily.    rosuvastatin (CRESTOR) 10 mg tablet TAKE 1 TABLET BY MOUTH EVERY DAY    terbinafine HCL (LAMISIL) 250 mg tablet Take one tablet by mouth daily.    varenicline (CHANTIX) 1 mg tablet Take one tablet by mouth twice daily.    walker (ULTRA-LIGHT ROLLATOR) medical supply Use as directed. Vitals:    08/12/23 1318   BP: 109/80   BP Source: Arm, Right Upper   Pulse: 69   Resp: 16   SpO2: 97%   PainSc: Seven   Weight: 108.9 kg (240 lb)   Height: 170.2 cm (5' 7)     Body mass index is 37.59 kg/m?Marland Kitchen     Physical Exam  Vitals reviewed.   Constitutional:       Appearance: Normal appearance.   Pulmonary:      Effort: Pulmonary effort is normal. No respiratory distress.   Skin:     General: Skin is warm and dry.   Neurological:      Mental Status: She is alert and oriented to person, place, and time.   Psychiatric:         Mood and Affect: Mood normal.         Behavior: Behavior normal.         Thought Content: Thought content normal.         Judgment: Judgment normal.                Assessment and Plan:    Problem   Osa (Obstructive Sleep Apnea)    HST 08/26/2022  BMI 38 243lb  AHI (4%) 12.9 supine 22.5  O2 low 72.7%, TIme <88% 306 minutes    Pt set up with AutoPAP @ 5-15cm H2O on 12/06/22.  DME: [facility] / [facility]  Insurance: [employer]  If the patient has to meet compliance, their compliance window will be from 01/06/23 to 03/06/23.     Oximetry 01/10/2023  On CPAP 5-15  Below 88% for <1 minute  Oxygen Nadir 87%     Tobacco Abuse    Smoked 1/2 to 1 pack cigarettes daily for 40 years.  Stopped (almost) late October 2019          OSA (obstructive sleep apnea)  Reviewed CPAP download. 26/30 days of usage.   Wearing 4 hours or greater 43% of nights. The average use on days used was 4 hours and 9 minutes.  Residual AHI 1.5.    The 95th percentile pressure was 13.9 cmH20, with a maximum of 14.9 cmH20, and a median of 10.6 cmH20.  The median leak was 0.3 L/min. She is  tolerating and benefiting from therapy.     - Will continue current settings, CPAP 8-18. DME order placed for mask refit.      - Keep in contact with DME for routine supplies, need to contact us directly if unable to communicate with DME in a timely manner.     Tobacco abuse  - Tobacco cessation encouraged  - Enrolled in LDCT lung cancer screening She has our contact information and was encouraged to call us with any questions or concerns.    RTC in 6 months    Orders Placed This Encounter    AMB REFERRAL TO HOME CARE     Total time- 27 minutes

## 2023-08-12 ENCOUNTER — Ambulatory Visit: Admit: 2023-08-12 | Discharge: 2023-08-12 | Payer: MEDICARE

## 2023-08-12 ENCOUNTER — Encounter: Admit: 2023-08-12 | Discharge: 2023-08-12 | Payer: MEDICARE

## 2023-08-12 DIAGNOSIS — G4733 Obstructive sleep apnea (adult) (pediatric): Secondary | ICD-10-CM

## 2023-08-12 DIAGNOSIS — Z720 Tobacco use: Secondary | ICD-10-CM

## 2023-08-12 NOTE — Assessment & Plan Note
-   Tobacco cessation encouraged  - Enrolled in LDCT lung cancer screening

## 2023-08-12 NOTE — Patient Instructions
If you have questions or concerns, please call my nurse at [phone] or feel free to send me a mychart message

## 2023-08-12 NOTE — Assessment & Plan Note
Reviewed CPAP download. 26/30 days of usage.   Wearing 4 hours or greater 43% of nights. The average use on days used was 4 hours and 9 minutes.  Residual AHI 1.5.    The 95th percentile pressure was 13.9 cmH20, with a maximum of 14.9 cmH20, and a median of 10.6 cmH20.  The median leak was 0.3 L/min. She is tolerating and benefiting from therapy.     - Will continue current settings, CPAP 8-18. DME order placed for mask refit.      - Keep in contact with DME for routine supplies, need to contact us directly if unable to communicate with DME in a timely manner.

## 2023-08-13 ENCOUNTER — Encounter: Admit: 2023-08-13 | Discharge: 2023-08-13 | Payer: MEDICARE

## 2023-08-13 NOTE — Telephone Encounter
Order for mask refit placed as discussed per office note on 08/12/23    DME: Irven [place]

## 2023-08-14 ENCOUNTER — Encounter: Admit: 2023-08-14 | Discharge: 2023-08-14 | Payer: MEDICARE

## 2023-08-21 ENCOUNTER — Encounter: Admit: 2023-08-21 | Discharge: 2023-08-21 | Payer: MEDICARE

## 2023-08-21 ENCOUNTER — Inpatient Hospital Stay
Admit: 2023-08-21 | Discharge: 2023-08-26 | Disposition: A | Discharge status: Home / Self Care | Payer: MEDICARE | Attending: Orthopaedic Surgery of the Spine | Admitting: Orthopaedic Surgery of the Spine

## 2023-08-21 ENCOUNTER — Inpatient Hospital Stay: Admit: 2023-08-21 | Discharge: 2023-08-21 | Payer: MEDICARE

## 2023-08-21 ENCOUNTER — Inpatient Hospital Stay: Admit: 2023-08-21 | Discharge: 2023-08-22

## 2023-08-21 LAB — CBC
~~LOC~~ BKR HEMATOCRIT: 37 % (ref 36.0–45.0)
~~LOC~~ BKR HEMOGLOBIN: 12 g/dL (ref 12.0–15.0)
~~LOC~~ BKR MCH: 31 pg (ref 26.0–34.0)
~~LOC~~ BKR MCHC: 33 g/dL (ref 32.0–36.0)
~~LOC~~ BKR MCV: 95 fL (ref 80.0–100.0)
~~LOC~~ BKR MPV: 8.7 fL (ref 7.0–11.0)
~~LOC~~ BKR PLATELET COUNT: 210 10*3/uL (ref 150–400)
~~LOC~~ BKR RBC COUNT: 3.9 10*6/uL — ABNORMAL LOW (ref 4.00–5.00)
~~LOC~~ BKR RDW: 13 % (ref 11.0–15.0)
~~LOC~~ BKR WBC COUNT: 5.5 10*3/uL (ref 4.50–11.00)

## 2023-08-21 LAB — TYPE & CROSSMATCH
~~LOC~~ BKR ANTIBODY SCREEN: NEGATIVE
~~LOC~~ BKR UNITS ORDERED: 0

## 2023-08-21 LAB — BLOOD GASES, ARTERIAL
FIO2 VALUE: 50 %
~~LOC~~ BKR BASE DEFICIT-ART: 1.6 mmol/L (ref 137–147)
~~LOC~~ BKR BICARB, ART(CAL): 23 mmol/L (ref 21.0–28.0)
~~LOC~~ BKR O2 SAT-ART: 99 % — ABNORMAL HIGH (ref 95.0–99.0)
~~LOC~~ BKR PCO2-ART: 46 mmHg — ABNORMAL HIGH (ref 35–45)
~~LOC~~ BKR PH-ART: 7.3 % — ABNORMAL LOW (ref 7.35–7.45)
~~LOC~~ BKR PO2-ART: 149 mmHg — ABNORMAL HIGH (ref 80–100)

## 2023-08-21 LAB — HEMOGLOBIN & HEMATOCRIT, BG: ~~LOC~~ BKR HEMOGLOBIN BLOOD GAS: 12 g/dL (ref 12.0–15.0)

## 2023-08-21 MED ORDER — PHENYLEPHRINE IN 0.9% NACL (STD CONC) (PREMADE)(AM)(OR)
INTRAVENOUS | 0 refills | Status: DC
Start: 2023-08-21 — End: 2023-08-21
  Administered 2023-08-21: 14:00:00 .3 ug/kg/min via INTRAVENOUS

## 2023-08-21 MED ORDER — OXYCODONE 5 MG PO TAB
5-15 mg | ORAL | 0 refills | Status: DC | PRN
Start: 2023-08-21 — End: 2023-08-22

## 2023-08-21 MED ORDER — BISACODYL 10 MG RE SUPP
10 mg | Freq: Every day | RECTAL | 0 refills | Status: DC | PRN
Start: 2023-08-21 — End: 2023-08-26

## 2023-08-21 MED ORDER — HYDROMORPHONE PCA 10 MG/50 ML SYR (STD CONC)(ADULT)(PREMADE)
INTRAVENOUS | 0 refills | Status: DC
Start: 2023-08-21 — End: 2023-08-22
  Administered 2023-08-22: 04:00:00 50.0000 mL via INTRAVENOUS

## 2023-08-21 MED ORDER — BACITRACIN ZINC 500 UNIT/GRAM TP OINT
0 refills | Status: DC
Start: 2023-08-21 — End: 2023-08-21

## 2023-08-21 MED ORDER — MAGNESIUM HYDROXIDE 400 MG/5 ML PO SUSP
30 mL | Freq: Every day | ORAL | 0 refills | Status: DC
Start: 2023-08-21 — End: 2023-08-26
  Administered 2023-08-22 – 2023-08-26 (×4): 30 mL via ORAL

## 2023-08-21 MED ORDER — LIDOCAINE IN 5 % DEXTROSE (PF) 8 MG/ML (0.8 %) IV SOLP (INFUSION)(AM)(OR)
INTRAVENOUS | 0 refills | Status: DC
Start: 2023-08-21 — End: 2023-08-21

## 2023-08-21 MED ORDER — HYDRALAZINE 20 MG/ML IJ SOLN
INTRAVENOUS | 0 refills | Status: DC
Start: 2023-08-21 — End: 2023-08-21

## 2023-08-21 MED ORDER — MIRTAZAPINE 15 MG PO TAB
30 mg | Freq: Every evening | ORAL | 0 refills | Status: DC
Start: 2023-08-21 — End: 2023-08-26
  Administered 2023-08-22 – 2023-08-26 (×5): 30 mg via ORAL

## 2023-08-21 MED ORDER — HALOPERIDOL LACTATE 5 MG/ML IJ SOLN
1 mg | Freq: Once | INTRAVENOUS | 0 refills | Status: DC | PRN
Start: 2023-08-21 — End: 2023-08-21

## 2023-08-21 MED ORDER — CELECOXIB 200 MG PO CAP
200 mg | Freq: Every day | ORAL | 0 refills | Status: DC
Start: 2023-08-21 — End: 2023-08-26
  Administered 2023-08-22 – 2023-08-26 (×5): 200 mg via ORAL

## 2023-08-21 MED ORDER — CLONAZEPAM 1 MG PO TAB
1 mg | Freq: Every day | ORAL | 0 refills | Status: DC
Start: 2023-08-21 — End: 2023-08-26
  Administered 2023-08-22 – 2023-08-26 (×5): 1 mg via ORAL

## 2023-08-21 MED ORDER — VARENICLINE 1 MG PO TAB
1 mg | Freq: Two times a day (BID) | ORAL | 0 refills | Status: DC
Start: 2023-08-21 — End: 2023-08-26
  Administered 2023-08-22 – 2023-08-26 (×10): 1 mg via ORAL

## 2023-08-21 MED ORDER — DOCUSATE SODIUM 100 MG PO CAP
100 mg | Freq: Two times a day (BID) | ORAL | 0 refills | Status: DC
Start: 2023-08-21 — End: 2023-08-26
  Administered 2023-08-22 – 2023-08-26 (×9): 100 mg via ORAL

## 2023-08-21 MED ORDER — HYDROMORPHONE (PF) 2 MG/ML IJ SYRG
INTRAVENOUS | 0 refills | Status: DC
Start: 2023-08-21 — End: 2023-08-21

## 2023-08-21 MED ORDER — FLUOXETINE 20 MG PO CAP
40 mg | Freq: Every day | ORAL | 0 refills | Status: DC
Start: 2023-08-21 — End: 2023-08-26
  Administered 2023-08-22 – 2023-08-26 (×5): 40 mg via ORAL

## 2023-08-21 MED ORDER — PROPOFOL INJ 10 MG/ML IV VIAL
INTRAVENOUS | 0 refills | Status: DC
Start: 2023-08-21 — End: 2023-08-21

## 2023-08-21 MED ORDER — DIPHENHYDRAMINE HCL 50 MG/ML IJ SOLN
25 mg | INTRAVENOUS | 0 refills | Status: DC | PRN
Start: 2023-08-21 — End: 2023-08-26

## 2023-08-21 MED ORDER — EUCALYPTUS-MENTHOL MM LOZG
1 | ORAL | 0 refills | Status: DC | PRN
Start: 2023-08-21 — End: 2023-08-26

## 2023-08-21 MED ORDER — REMIFENTANYL 1000MCG IN NS 20ML (OR)
INTRAVENOUS | 0 refills | Status: DC
Start: 2023-08-21 — End: 2023-08-21
  Administered 2023-08-21 (×2): .08 ug/kg/min via INTRAVENOUS
  Administered 2023-08-21 (×2): .1 ug/kg/min via INTRAVENOUS

## 2023-08-21 MED ORDER — ONDANSETRON HCL (PF) 4 MG/2 ML IJ SOLN
4 mg | INTRAVENOUS | 0 refills | Status: DC | PRN
Start: 2023-08-21 — End: 2023-08-26

## 2023-08-21 MED ORDER — ALBUTEROL SULFATE 90 MCG/ACTUATION IN HFAA
1-2 | RESPIRATORY_TRACT | 0 refills | Status: DC | PRN
Start: 2023-08-21 — End: 2023-08-26

## 2023-08-21 MED ORDER — LISINOPRIL 20 MG PO TAB
40 mg | Freq: Every day | ORAL | 0 refills | Status: DC
Start: 2023-08-21 — End: 2023-08-26
  Administered 2023-08-22 – 2023-08-26 (×5): 40 mg via ORAL

## 2023-08-21 MED ORDER — SODIUM CHLORIDE 0.9% IV SOLP
INTRAVENOUS | 0 refills | Status: AC
Start: 2023-08-21 — End: ?

## 2023-08-21 MED ORDER — DEXAMETHASONE SODIUM PHOSPHATE 10 MG/ML IJ SOLN
4 mg | INTRAVENOUS | 0 refills | Status: CP
Start: 2023-08-21 — End: ?
  Administered 2023-08-22 (×4): 4 mg via INTRAVENOUS

## 2023-08-21 MED ORDER — ROSUVASTATIN 5 MG PO TAB
10 mg | Freq: Every day | ORAL | 0 refills | Status: DC
Start: 2023-08-21 — End: 2023-08-26
  Administered 2023-08-22 – 2023-08-26 (×5): 10 mg via ORAL

## 2023-08-21 MED ORDER — IMS MIXTURE TEMPLATE
200 mg | Freq: Three times a day (TID) | ORAL | 0 refills | Status: DC
Start: 2023-08-21 — End: 2023-08-26
  Administered 2023-08-22 – 2023-08-26 (×28): 200 mg via ORAL

## 2023-08-21 MED ORDER — GLYCOPYRROLATE 0.2 MG/ML IJ SOLN
INTRAVENOUS | 0 refills | Status: DC
Start: 2023-08-21 — End: 2023-08-21

## 2023-08-21 MED ORDER — TERBINAFINE HCL 250 MG PO TAB
250 mg | Freq: Every day | ORAL | 0 refills | Status: DC
Start: 2023-08-21 — End: 2023-08-26
  Administered 2023-08-22 – 2023-08-26 (×6): 250 mg via ORAL

## 2023-08-21 MED ORDER — LIDOCAINE (PF) 200 MG/10 ML (2 %) IJ SYRG
INTRAVENOUS | 0 refills | Status: DC
Start: 2023-08-21 — End: 2023-08-21

## 2023-08-21 MED ORDER — METOCLOPRAMIDE HCL 5 MG/ML IJ SOLN
10 mg | Freq: Once | INTRAVENOUS | 0 refills | Status: DC | PRN
Start: 2023-08-21 — End: 2023-08-22

## 2023-08-21 MED ORDER — FLUTICASONE FUROATE-VILANTEROL 100-25 MCG/DOSE IN DSDV
1 | Freq: Every day | RESPIRATORY_TRACT | 0 refills | Status: DC
Start: 2023-08-21 — End: 2023-08-26
  Administered 2023-08-22: 14:00:00 1 via RESPIRATORY_TRACT

## 2023-08-21 MED ORDER — CEFAZOLIN 2 GRAM IV SOLR
2 g | Freq: Once | INTRAVENOUS | 0 refills | Status: CP
Start: 2023-08-21 — End: ?
  Administered 2023-08-21 (×2): 2 g via INTRAVENOUS

## 2023-08-21 MED ORDER — DEXAMETHASONE SODIUM PHOSPHATE 4 MG/ML IJ SOLN
INTRAVENOUS | 0 refills | Status: DC
Start: 2023-08-21 — End: 2023-08-21

## 2023-08-21 MED ORDER — METHOCARBAMOL 100 MG/ML IJ SOLN
1000 mg | Freq: Once | INTRAVENOUS | 0 refills | Status: CP
Start: 2023-08-21 — End: ?
  Administered 2023-08-21: 23:00:00 1000 mg via INTRAVENOUS

## 2023-08-21 MED ORDER — KETOROLAC 30 MG/ML (1 ML) IJ SOLN
15 mg | Freq: Once | INTRAVENOUS | 0 refills | Status: CP
Start: 2023-08-21 — End: ?
  Administered 2023-08-22: 15 mg via INTRAVENOUS

## 2023-08-21 MED ORDER — CYCLOBENZAPRINE 5 MG PO TAB
5 mg | ORAL | 0 refills | Status: DC | PRN
Start: 2023-08-21 — End: 2023-08-26
  Administered 2023-08-23 – 2023-08-25 (×6): 5 mg via ORAL

## 2023-08-21 MED ORDER — HYDROMORPHONE (PF) 2 MG/ML IJ SYRG
.5-1 mg | INTRAVENOUS | 0 refills | Status: DC | PRN
Start: 2023-08-21 — End: 2023-08-22

## 2023-08-21 MED ORDER — FENTANYL CITRATE (PF) 50 MCG/ML IJ SOLN
12.5 ug | INTRAVENOUS | 0 refills | Status: DC | PRN
Start: 2023-08-21 — End: 2023-08-22

## 2023-08-21 MED ORDER — KETAMINE 10 MG/ML IJ SOLN (INFUSION)(AM)(OR)
INTRAVENOUS | 0 refills | Status: DC
Start: 2023-08-21 — End: 2023-08-21
  Administered 2023-08-21: 14:00:00 150 ug/kg/h via INTRAVENOUS

## 2023-08-21 MED ORDER — ACETAMINOPHEN 500 MG PO TAB
1000 mg | Freq: Once | ORAL | 0 refills | Status: DC | PRN
Start: 2023-08-21 — End: 2023-08-22

## 2023-08-21 MED ORDER — CEFAZOLIN 2 GRAM IV SOLR
2 g | INTRAVENOUS | 0 refills | Status: CP
Start: 2023-08-21 — End: ?
  Administered 2023-08-22 (×2): 2 g via INTRAVENOUS

## 2023-08-21 MED ORDER — NALOXONE 0.4 MG/ML IJ SOLN
.08 mg | INTRAVENOUS | 0 refills | Status: DC | PRN
Start: 2023-08-21 — End: 2023-08-22

## 2023-08-21 MED ORDER — LACTATED RINGERS IV SOLP
1000 mL | INTRAVENOUS | 0 refills | Status: DC
Start: 2023-08-21 — End: 2023-08-22
  Administered 2023-08-21 (×2): 1000.0000 mL via INTRAVENOUS

## 2023-08-21 MED ORDER — SUCCINYLCHOLINE CHLORIDE 20 MG/ML IJ SOLN
INTRAVENOUS | 0 refills | Status: DC
Start: 2023-08-21 — End: 2023-08-21

## 2023-08-21 MED ORDER — HYDROMORPHONE (PF) 2 MG/ML IJ SYRG
.5 mg | INTRAVENOUS | 0 refills | Status: DC | PRN
Start: 2023-08-21 — End: 2023-08-22

## 2023-08-21 MED ORDER — SODIUM CHLORIDE 0.9% IV SOLP
INTRAVENOUS | 0 refills | Status: DC
Start: 2023-08-21 — End: 2023-08-21

## 2023-08-21 MED ORDER — PROPOFOL 10 MG/ML IV EMUL 100 ML (INFUSION)(AM)(OR)
INTRAVENOUS | 0 refills | Status: DC
Start: 2023-08-21 — End: 2023-08-21
  Administered 2023-08-21: 18:00:00 160 ug/kg/min via INTRAVENOUS
  Administered 2023-08-21: 14:00:00 150 ug/kg/min via INTRAVENOUS
  Administered 2023-08-21: 17:00:00 160 ug/kg/min via INTRAVENOUS
  Administered 2023-08-21: 14:00:00 180 ug/kg/min via INTRAVENOUS

## 2023-08-21 MED ORDER — HALOPERIDOL LACTATE 5 MG/ML IJ SOLN
2.5 mg | Freq: Once | INTRAVENOUS | 0 refills | Status: CP | PRN
Start: 2023-08-21 — End: ?
  Administered 2023-08-21: 22:00:00 2.5 mg via INTRAVENOUS

## 2023-08-21 MED ORDER — ACETAMINOPHEN 1,000 MG/100 ML (10 MG/ML) IV SOLN
1000 mg | Freq: Once | INTRAVENOUS | 0 refills | Status: CP
Start: 2023-08-21 — End: ?
  Administered 2023-08-21: 23:00:00 1000 mg via INTRAVENOUS

## 2023-08-21 MED ORDER — BUPIVACAINE-EPINEPHRINE 0.5 %-1:200,000 IJ SOLN
0 refills | Status: DC
Start: 2023-08-21 — End: 2023-08-21

## 2023-08-21 MED ORDER — ARTIFICIAL TEARS (PF) SINGLE DOSE DROPS GROUP
OPHTHALMIC | 0 refills | Status: DC
Start: 2023-08-21 — End: 2023-08-21

## 2023-08-21 MED ORDER — ACETAMINOPHEN 500 MG PO TAB
1000 mg | ORAL | 0 refills | Status: DC
Start: 2023-08-21 — End: 2023-08-22
  Administered 2023-08-22 (×2): 1000 mg via ORAL

## 2023-08-21 MED ORDER — CELECOXIB 200 MG PO CAP
200 mg | Freq: Once | ORAL | 0 refills | Status: CP
Start: 2023-08-21 — End: ?
  Administered 2023-08-21: 13:00:00 200 mg via ORAL

## 2023-08-21 MED ORDER — POLYETHYLENE GLYCOL 3350 17 GRAM PO PWPK
1 | Freq: Two times a day (BID) | ORAL | 0 refills | Status: DC
Start: 2023-08-21 — End: 2023-08-26
  Administered 2023-08-22 – 2023-08-26 (×7): 17 g via ORAL

## 2023-08-21 MED ORDER — ACETAMINOPHEN 500 MG PO TAB
1000 mg | Freq: Once | ORAL | 0 refills | Status: CP
Start: 2023-08-21 — End: ?
  Administered 2023-08-21: 13:00:00 1000 mg via ORAL

## 2023-08-21 MED ORDER — VANCOMYCIN 1,000 MG IV SOLR
0 refills | Status: DC
Start: 2023-08-21 — End: 2023-08-21

## 2023-08-21 MED ORDER — LABETALOL 5 MG/ML IV SYRG
INTRAVENOUS | 0 refills | Status: DC
Start: 2023-08-21 — End: 2023-08-21

## 2023-08-21 MED ORDER — DIPHENHYDRAMINE HCL 50 MG/ML IJ SOLN
25 mg | Freq: Once | INTRAVENOUS | 0 refills | Status: DC | PRN
Start: 2023-08-21 — End: 2023-08-22

## 2023-08-21 MED ORDER — ROCURONIUM 10 MG/ML IV SOLN GROUP
INTRAVENOUS | 0 refills | Status: DC
Start: 2023-08-21 — End: 2023-08-21

## 2023-08-21 MED ORDER — LIDOCAINE IN 5 % DEXTROSE (PF) 8 MG/ML (0.8 %) IV SOLP (INFUSION)(AM)(OR)
INTRAVENOUS | 0 refills | Status: DC
Start: 2023-08-21 — End: 2023-08-21
  Administered 2023-08-21: 14:00:00 25 ug/kg/min via INTRAVENOUS

## 2023-08-21 MED ORDER — MIDAZOLAM 1 MG/ML IJ SOLN
INTRAVENOUS | 0 refills | Status: DC
Start: 2023-08-21 — End: 2023-08-21

## 2023-08-21 MED ORDER — OXYCODONE 5 MG PO TAB
5 mg | Freq: Once | ORAL | 0 refills | Status: DC | PRN
Start: 2023-08-21 — End: 2023-08-22

## 2023-08-21 MED ORDER — FENTANYL CITRATE (PF) 50 MCG/ML IJ SOLN
INTRAVENOUS | 0 refills | Status: DC
Start: 2023-08-21 — End: 2023-08-21

## 2023-08-21 MED ORDER — LABETALOL 5 MG/ML IV SOLN
10 mg | INTRAVENOUS | 0 refills | Status: DC | PRN
Start: 2023-08-21 — End: 2023-08-22

## 2023-08-21 MED ORDER — SUGAMMADEX 100 MG/ML IV SOLN
INTRAVENOUS | 0 refills | Status: DC
Start: 2023-08-21 — End: 2023-08-21

## 2023-08-21 MED ORDER — DIPHENHYDRAMINE HCL 25 MG PO CAP
25 mg | ORAL | 0 refills | Status: DC | PRN
Start: 2023-08-21 — End: 2023-08-26

## 2023-08-21 MED ORDER — ONDANSETRON HCL (PF) 4 MG/2 ML IJ SOLN
INTRAVENOUS | 0 refills | Status: DC
Start: 2023-08-21 — End: 2023-08-21

## 2023-08-21 MED ORDER — CHLORHEXIDINE GLUCONATE 0.12 % MM MWSH
15 mL | Freq: Two times a day (BID) | ORAL | 0 refills | Status: DC
Start: 2023-08-21 — End: 2023-08-26
  Administered 2023-08-22 – 2023-08-26 (×9): 15 mL via ORAL

## 2023-08-21 NOTE — Interval H&P Note
History and Physical Update Note    Allergies:  Erythromycin, Strawberry, and Ascorbic acid    Lab/Radiology/Other Diagnostic Tests:  Pertinent labs reviewed  Point of Care Testing:  (Last 24 hours):        Nutrition: No Dietitian Consult  Wound: No Wound Consult      I have examined the patient, and there are no significant changes in their condition, from the previous H&P performed on 07/31/2023.    Ria Bush Jony Ladnier, PA-C  Pager [phone]/Voal[facility]    --------------------------------------------------------------------------------------------------------------------------------------------

## 2023-08-21 NOTE — Unmapped
Neurology intra-Operating Monitoring report (IOM)    Date of procedure: 08/21/2023    Diagnosis: Cervical myelopathy, cervical spinal stenosis, pseudoarthrosis of the cervical spine  Procedure: Revision anterior/posterior cervical 5-7 decompression and fusion  Surgeon: Liam Rogers  Baseline acquisition time: 0753  Monitoring start time: 0805  Incision time: 0831  Closing started at: 1404  Monitoring stop time: 1417    TcMEP: Trancranial Motor Evoked Potential:  Post induction baseline transcranial motor evoked potentials from bilateral deltoid, biceps, triceps, extensor digitorum communis, abductor digiti minimi, tibialis anterior and abductor hallucis muscles were interpretable and reproducible. The compound muscle action potential data showed no evidence to suggest change in the lateral cortico-spinal pathways as a consequence of this surgery.    SSEP: Somatosensory Evoked Potentials:  Post induction baseline somatosensory evoked potentials from bilateral ulnar and posterior tibial nerves were obtained and were interpretable, reproducible and monitorable. The data from neurophysiological monitoring of somatosensory evoked potentials showed no evidence to suggest change in the posterior column-medial lemniscus cortical pathways as a consequence of this surgery.    EMG: Electromyography:  EMG was continuously monitored from bilateral deltoid, biceps, triceps, extensor digitorum communis, abductor digiti minimi, tibialis anterior and abductor hallucis muscles were obtained and monitored throughout the procedure. There was no sustained baseline EMG activity.     Repetitive Nerve Stimulation:  TO4 (train of 4) was frequently run throughout the procedure to monitor for neuromuscular blockade. A nerve was repetitively stimulation and the compound motor action potentials (CMAPs) recorded from a distal motor group. TO4 was assessed during critical periods of surgery and demonstrated that neuromuscular blockade was not present while monitoring EMGs and TcMEP.        I remotely monitored and supervised this case in real-time, online via a secure HIPAA-compliant network.    Orvil Feil, MD  Clinical Assistant Professor  Department of Neurology

## 2023-08-21 NOTE — Progress Notes
PHYSICAL THERAPY  NOTE      Name: Kristen Vaughn   MRN: [id number]     DOB: 1961/03/24      Age: 62 y.o.  Admission Date: 08/21/2023     LOS: 0 days     Date of Service: 08/21/2023      PT has been monitoring patient this date per ERAS protocol. Patient remains in recovery at this time. Our service will plan to perform our formal assessment tomorrow (11/28).    Therapist: Alvera Singh, PT  Date: 08/21/2023

## 2023-08-21 NOTE — Unmapped
Brief Operative Note    Name: Kristen Vaughn is a 62 y.o. 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]    Procedure(s):  REVISION ANTERIOR/POSTERIOR CERVICAL 5-7 DECOMPRESSION AND FUSION  ARTHRODESIS SPINE ANTERIOR INTERBODY WITH DISCECTOMY/ OSTEOPHYTECTOMY/ DECOMPRESSION - CERVICAL BELOW C2 - EACH ADDITIONAL INTERSPACE  INSERTION INTERBODY BIOMECHANICAL DEVICE WITH INTEGRAL ANTERIOR INSTRUMENTATION FOR DEVICE ANCHORING TO INTERVERTEBRAL DISC SPACE IN CONJUNCTION WITH INTERBODY ARTHRODESIS - EACH INTERSPACE  POSTERIOR SEGMENTAL INSTRUMENTATION - 3 TO 6 VERTEBRAL SEGMENTS  FUSION SPINE POSTERIOR - CERVICAL BELOW C2  FUSION SPINE POSTERIOR - LUMBAR - EACH ADDITIONAL SEGMENT    Surgeons and Role:     * Bunch, Jilda Roche, MD - Primary     * Mabry Santarelli L, PA-C - Assisting      Findings:  cervical spinal stenosis      Estimated Blood Loss: No blood loss documented.     Specimen(s) Removed/Disposition:   ID Type Source Tests Collected by Time Destination   A : Cervical spine hardware (cultures and sonnification) Hardware Spine CULTURE-ANAEROBIC, CULTURE-WOUND/TISSUE/FLUID(AEROBIC ONLY)W/SENSITIVITY, CULTURE-TB (AFB) (Canceled), CULTURE-FUNGAL,OTHER Criss Rosales, MD 08/21/2023 [phone]        Complications:  None      Implants:   Implant Name Serial No. Manufacturer Lot No. LRB No. Used Action   AcuPac X 2.5cc? GRAFT HUMAN TISSUE FROZEN CANCELLOUS - [id number] [employer] [id number] N/A 1 Implanted   GRAFT BONE INFUSE XXS BOVINE COLLAGEN RHBMP-2 KIT LUMBAR - SNA NA [employer] [id number] N/A 1 Implanted   GRAFT BONE INFUSE XS BOVINE COLLAGEN RHBMP-2 KIT LUMBAR - SNA NA [employer] [id number] N/A 1 Implanted   AcuPac X 10cc? ALLOGRAFT ASEPTIC BONE CANCELLOUS FRESH FROZEN - [id number] [employer] NA N/A 1 Implanted   MESH 14x11mm, 16mm - [id number] 156.504 [employer] 156.504 N/A 1 Implanted   ENDCAP 14x46mm, 3mm, 0?? - [id number] 156.553 [employer] 156.553 N/A 1 Implanted   ENDCAP 14x72mm, 4mm, 3.5?? - [id number] 156.564 [employer] 156.564 N/A 1 Implanted   SCREW BONE  QUARTEX SPINE POLYAXIAL - [id number] 1149.4016 [employer] 1149.4016 N/A 2 Implanted   SCREW BONE 4.5MM QUARTEX SPINE POLYAXIAL - [id number] 3235.5732 [facility] [employer] 2025.4270 N/A 2 Implanted   CAP LOCKING QUARTEX SPINE THREAD - [id number] 1149.0001 [employer] 1149.0001 N/A 4 Implanted   ROD SPINAL  CURVE - [id number] 1149.7540 [employer] 5176.1607 N/A 2 Implanted   Ossifuse HSA Fiber Bone Graft Strips 50x10x36mm (2 pack) 5cc - [id number] [facility] [employer] [id number] N/A 1 Implanted       Drains: Details on drains available on LDA report    Disposition:  PACU - stable    Francia Verry L Marlaine Arey, PA-C  Pager [phone]/[facility]

## 2023-08-22 ENCOUNTER — Inpatient Hospital Stay: Admit: 2023-08-22 | Discharge: 2023-08-22 | Payer: MEDICARE

## 2023-08-22 DIAGNOSIS — M4802 Spinal stenosis, cervical region: Secondary | ICD-10-CM

## 2023-08-22 MED ORDER — FENTANYL CITRATE (PF) 50 MCG/ML IJ SOLN
25 ug | INTRAVENOUS | 0 refills | Status: DC | PRN
Start: 2023-08-22 — End: 2023-08-26
  Administered 2023-08-25: 06:00:00 25 ug via INTRAVENOUS

## 2023-08-22 MED ORDER — PANTOPRAZOLE 40 MG PO TBEC
40 mg | Freq: Every day | ORAL | 0 refills | Status: DC
Start: 2023-08-22 — End: 2023-08-26
  Administered 2023-08-23 – 2023-08-26 (×4): 40 mg via ORAL

## 2023-08-22 MED ORDER — HYDROCODONE-ACETAMINOPHEN 5-325 MG PO TAB
1 | ORAL | 0 refills | Status: DC | PRN
Start: 2023-08-22 — End: 2023-08-23
  Administered 2023-08-22 – 2023-08-23 (×6): 1 via ORAL

## 2023-08-22 MED ORDER — ACETAMINOPHEN 500 MG PO TAB
500 mg | ORAL | 0 refills | Status: DC
Start: 2023-08-22 — End: 2023-08-23
  Administered 2023-08-23 (×2): 500 mg via ORAL

## 2023-08-22 MED ORDER — OXYCODONE 5 MG PO TAB
5 mg | ORAL | 0 refills | Status: DC | PRN
Start: 2023-08-22 — End: 2023-08-22

## 2023-08-22 MED ADMIN — WATER FOR INJECTION, STERILE IJ SOLN [79513]: 20 mL | INTRAVENOUS | @ 03:00:00 | Stop: 2023-08-22 | NDC 00409488723

## 2023-08-22 MED ADMIN — WATER FOR INJECTION, STERILE IJ SOLN [79513]: 20 mL | INTRAVENOUS | @ 10:00:00 | Stop: 2023-08-22 | NDC 00409488723

## 2023-08-22 NOTE — Anesthesia Pain Rounding
Anesthesia Follow-Up Evaluation: Post-Procedure Day One    Name: Kristen Vaughn     MRN: [id number]     DOB: 16-Aug-1961     Age: 62 y.o.     Sex: female   __________________________________________________________________________     Procedure Date: 08/21/2023   Procedure: Procedure(s) with comments:  REVISION ANTERIOR/POSTERIOR CERVICAL 5-7 DECOMPRESSION AND FUSION - 6 hrs, Supine on Regular OR table. Flip to Prone on Jean Rosenthal table with Wilson frame. C-Arm. Globus implants. [place]. Neuromonitoring  ARTHRODESIS SPINE ANTERIOR INTERBODY WITH DISCECTOMY/ OSTEOPHYTECTOMY/ DECOMPRESSION - CERVICAL BELOW C2 - EACH ADDITIONAL INTERSPACE  INSERTION INTERBODY BIOMECHANICAL DEVICE WITH INTEGRAL ANTERIOR INSTRUMENTATION FOR DEVICE ANCHORING TO INTERVERTEBRAL DISC SPACE IN CONJUNCTION WITH INTERBODY ARTHRODESIS - EACH INTERSPACE  POSTERIOR SEGMENTAL INSTRUMENTATION - 3 TO 6 VERTEBRAL SEGMENTS  FUSION SPINE POSTERIOR - CERVICAL BELOW C2  FUSION SPINE POSTERIOR - LUMBAR - EACH ADDITIONAL SEGMENT    Physical Assessment  Height: 170.2 cm (5' 7)  Weight: 110.3 kg (243 lb 2.7 oz)    Vital Signs (Last Filed in 24 hours)  BP: 119/64 (11/28 0742)  Temp: 36.7 ?C (98.1 ?F) (11/28 4742)  Pulse: 77 (11/28 0820)  Respirations: 18 PER MINUTE (11/28 0820)  SpO2: 98 % (11/28 0820)  O2 Device: Nasal cannula (11/28 0820)  O2 Liter Flow: 2 Lpm (11/28 0820)    Patient History   Allergies  Allergies   Allergen Reactions    Erythromycin RASH     Rash on trunk    Strawberry RASH    Ascorbic Acid NAUSEA ONLY and UNKNOWN        Medications  Scheduled Meds:acetaminophen (TYLENOL EXTRA STRENGTH) tablet 500 mg, 500 mg, Oral, Q8H  celecoxib (CeleBREX) capsule 200 mg, 200 mg, Oral, QDAY  chlorhexidine gluconate (PERIDEX) 0.12 % solution 15 mL, 15 mL, Swish & Spit, BID  clonazePAM (KlonoPIN) tablet 1 mg, 1 mg, Oral, QDAY  dexAMETHasone sodium phosphate (DECADRON) injection 4 mg, 4 mg, Intravenous, Q6H  docusate (COLACE) capsule 100 mg, 100 mg, Oral, BID  FLUoxetine (PROzac) capsule 40 mg, 40 mg, Oral, QDAY  fluticasone furoate-vilanteroL (BREO ELLIPTA) 100-25 mcg/actuation inhaler 1 puff, 1 puff, Inhalation, QDAY  lisinopriL (ZESTRIL) tablet 40 mg, 40 mg, Oral, QDAY  milk of magnesium oral suspension 30 mL, 30 mL, Oral, QDAY  mirtazapine (REMERON) tablet 30 mg, 30 mg, Oral, QHS  pantoprazole DR (PROTONIX) tablet 40 mg, 40 mg, Oral, QDAY(21)  polyethylene glycol 3350 (MIRALAX) packet 17 g, 1 packet, Oral, BID  pregabalin (LYRICA) capsule 200 mg, 200 mg, Oral, TID  rosuvastatin (CRESTOR) tablet 10 mg, 10 mg, Oral, QDAY  terbinafine HCL (LamISIL) tablet 250 mg, 250 mg, Oral, QDAY  varenicline (CHANTIX) tablet 1 mg, 1 mg, Oral, BID    Continuous Infusions:   sodium chloride 0.9 %   infusion 75 mL/hr at 08/21/23 2202     PRN and Respiratory Meds:albuterol sulfate Q6H PRN, bisacodyL QDAY PRN, cyclobenzaprine Q8H PRN, diphenhydrAMINE HCL Q6H PRN **OR** diphenhydrAMINE HCL Q6H PRN, eucalyptus-menthoL PRN, fentaNYL citrate PF Q1H PRN, HYDROcodone/acetaminophen Q4H PRN, ondansetron (ZOFRAN) IV Q6H PRN      Diagnostic Tests  Hematology:   Lab Results   Component Value Date    HGB 12.8 08/22/2023    HGB 12.7 01/08/2023    HCT 37.6 08/22/2023    HCT 38.6 01/08/2023    PLTCT 204 08/22/2023    PLTCT 226 01/08/2023    WBC 10.60 08/22/2023    WBC 6.24 01/08/2023    NEUT 59 10/06/2018  ANC 3.60 10/06/2018    ALC 1.70 10/06/2018    MONA 7 10/06/2018    AMC 0.40 10/06/2018    EOSA 4 10/06/2018    ABC 0.00 10/06/2018    MCV 94.9 08/22/2023    MCV 95.5 01/08/2023    MCH 32.2 08/22/2023    MCH 31.4 01/08/2023    MCHC 34.0 08/22/2023    MCHC 32.9 01/08/2023    MPV 9.1 08/22/2023    MPV 10.8 01/08/2023    RDW 13.6 08/22/2023    RDW 13.6 01/08/2023         General Chemistry:   Lab Results   Component Value Date    NA 139 08/22/2023    NA 140 08/21/2023    NA 139 04/02/2023    K 5.0 08/22/2023    K 4.7 08/21/2023    K 5.0 04/02/2023    CL 106 08/22/2023    CL 107 04/02/2023    CO2 25 08/22/2023    CO2 24.0 04/02/2023    GAP 9 08/22/2023    GAP 8 04/02/2023    BUN 21 08/22/2023    BUN 24.9 04/02/2023    CR 1.06 08/22/2023    CR 0.90 04/02/2023    GLU 140 08/22/2023    GLU 136 08/21/2023    GLU 108 04/02/2023    CA 8.5 08/22/2023    CA 9.4 04/02/2023    ALBUMIN 3.8 04/02/2023    OBSCA 1.23 08/21/2023    TOTBILI 0.20 04/02/2023      Coagulation:   Lab Results   Component Value Date    PT 10.3 07/31/2023    PTT 30.4 07/31/2023    INR 0.9 07/31/2023         Follow-Up Assessment  Patient location during evaluation: floor      Anesthetic Complications:   Anesthetic complications: The patient did not experience any anesthestic complications.      Pain:  Score: 7    Management:satisfactory to patient     Level of Consciousness: awake and alert   Hydration:acceptable     Airway Patency: patent   Respiratory Status: nasal cannula and spontaneous ventilation     Cardiovascular Status:hemodynamically stable   Regional/Neuroaxial:       Epidural in place             Comments: Pt on 2L NC. PCA pump for pain management.

## 2023-08-22 NOTE — Progress Notes
[facility] END OF SHIFT/ JHFRAT NOTE    Admission Date: 08/21/2023  Length of Stay: LOS: 1 day    Acute events, interventions, provider communication: Pt c/o capnography alarm constantly alarming d/t pt cannot use CPAP d/t Aspen cervical collar. Changed from NC to CO2 filter cannula, this has decreased alarming of capnography, pt is currently resting.    Patient Interventions and Education  Fall Risk/JHFRAT Interventions and Education: (Charting when applicable)   Total Fall Risk Score: Total Fall Risk Score: 13.   Elimination Interventions : Bed pan available in room and Use of bladder management device (e.g., female/female external urinary containment device)   Medications : Educate patient on medication side effects  Patient Care Equipment: Needs assistance with patient care equipment when ambulating and Ensure environment is free of clutter and walkways are clear from tripping hazards  Mobility: Assist x1, Gait belt in use when ambulating, and Utilize walker, cane, or additional walking aid for ambulation  Cognition: Stay within arm's reach while patient ambulating/toileting/showering  Risk for Moderate/Major Injury: Risk for fracture  2. BMAT: Bedside Mobility Assessment Tool (BMAT)  Sit and Shake: Pass  Extend and Point: Pass  Stand: Fail (Yellow: stand, transfer and/or walk assisted), stop assessment    3. Restraints:  No     Restraint Goal: Patient will be free from injury while physically restrained.  See Docflowsheet for restraint documentation, interventions, education, etc.    Hygiene:     Bath/Shower: CHG wipes shower/bath (prior to admission)     Oral Care: Brush     Urinary Catheter / Perineal Care: Indwelling urinary catheter care wipes    Intake and Output:            ,           Date 08/21/23 0701 - 08/22/23 0700 08/22/23 0701 - 08/23/23 0700   Shift 0701-1900 1901-0700 24 Hour Total 0701-1900 1901-0700 24 Hour Total   INTAKE   P.O.  716 716      I.V.(mL/kg/hr) 2500(1.9) 30 2530      Shift Total(mL/kg) 8119(14.7) 746(6.8) 8295(62.1)      OUTPUT   Urine(mL/kg/hr) 1760(1.3) 1200 2960        Urine 410  410        Urine Output (mL) (Indwelling Urinary Catheter Standard 2-way) 1350 1200 2550      Drains 50 23 73        Drain Output (mL) (Surgical Drain Bulb (e.g. JP) Anterior;Right Neck) 50 23 73      Other 300  300        Estimated Blood Loss 300  300      Shift Total(mL/kg) 3086(57.8) 1223(11.1) 3333(30.2)      NET 390 -477 -87      Weight (kg) 110.3 110.3 110.3 110.3 110.3 110.3

## 2023-08-22 NOTE — Progress Notes
[facility] END OF SHIFT/ JHFRAT NOTE    Admission Date: 08/21/2023  Length of Stay: LOS: 1 day    Acute events, interventions, provider communication: PCA and foley dc pt switched to oral medications. Pain regimen adjusted. Pt has voided and diet advanced to regular diet.     Patient Interventions and Education  Fall Risk/JHFRAT Interventions and Education: (Charting when applicable)   Total Fall Risk Score: Total Fall Risk Score: 17.   Elimination Interventions : Commode at bedside  Medications : Bedside commode (i.e., urgency, frequency, dizziness) , Use of gait belt , Stay within arm's reach during toileting/showering (i.e., dizziness, orthostasis) , Educate patient on medication side effects, and Bed/chair alarm (i.e., change in mental status)   Patient Care Equipment: Needs assistance with patient care equipment when ambulating  Mobility: Assist x2, Gait belt in use when ambulating, Utilize walker, cane, or additional walking aid for ambulation, Elimination equipment at bedside (urinal or commode) , and Ensure the use of corrective lens and/or hearing aides are in place prior to ambulation   Cognition: Bed/Chair Alarm  and Stay within arm's reach while patient ambulating/toileting/showering  Risk for Moderate/Major Injury: N/A  2. BMAT: Bedside Mobility Assessment Tool (BMAT)  Sit and Shake: Pass  Extend and Point: Pass  Stand: Fail (Yellow: stand, transfer and/or walk assisted), stop assessment        Hygiene:     Bath/Shower: CHG surgical site care, CHG wipes shower/bath     Oral Care: Moisturize     Urinary Catheter / Perineal Care: Indwelling urinary catheter care wipes    Intake and Output:            ,           Date 08/21/23 0701 - 08/22/23 0700 08/22/23 0701 - 08/23/23 0700   Shift 0701-1900 1901-0700 24 Hour Total 0701-1900 1901-0700 24 Hour Total   INTAKE   P.O.  716 716 894  894   I.V.(mL/kg/hr) 2500(1.9) 30(0) 2530(1) 9.5  9.5   Shift Total(mL/kg) 2500(22.7) 746(6.8) 3246(29.4) 903.5(8.2)  903.5(8.2) OUTPUT   Urine(mL/kg/hr) 1760(1.3) 1200(0.9) 2960(1.1) 900  900     Urine 410  410 900  900     Urine Output (mL) ([REMOVED] Indwelling Urinary Catheter Standard 2-way) 1350 1200 2550      Drains 50 23 73 18  18     Drain Output (mL) (Surgical Drain Bulb (e.g. JP) Anterior;Right Neck) 50 23 73 18  18   Other 300  300        Estimated Blood Loss 300  300        Urine Occurrence     1 x  1 x   Shift Total(mL/kg) 1610(96.0) 1223(11.1) 3333(30.2) 918(8.3)  918(8.3)   NET 390 -477 -87 -14.5  -14.5   Weight (kg) 110.3 110.3 110.3 110.3 110.3 110.3

## 2023-08-22 NOTE — Progress Notes
OCCUPATIONAL THERAPY  ASSESSMENT NOTE      Name: Kristen Vaughn   MRN: [id number]     DOB: [birth year]      Age: 62 y.o.  Admission Date: 08/21/2023     LOS: 1 day     Date of Service: 08/22/2023    Mobility  Patient Turn/Position: Supine  Progressive Mobility Level: Walk in room  Distance Walked (feet): 10 ft  Level of Assistance: Assist X1  Assistive Device: Walker  Activity Limited By: Pain    Subjective  Significant Hospital Events: 62 y.o. 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 11/27  Special Considerations: Current oxygen requirement (2 L NC, PCA)  Pain: Complains of pain  Pain level: Before activity;During activity  Pain Location: Incision  Pain Interventions: PCA/PCEA/PNC present;Patient agrees to participate in therapy with current pain level;Patient assisted into position of comfort    Home Living Situation  Lives With: Alone  Type of Home: Apartment  Entry Stairs: No stairs;Elevator  In-Home Stairs: Able to live on main level  Patient Owned Equipment: Walker with wheels;Bathing: Shower chair;Cane: Single point  Comments: Pt reports using walker at baseline. Has a caregiver during the day who assists with meal prep and med management. Pt requesting night time caregiver.    Prior Level of Function  Level Of Independence: Independent with ADL and community mobility without device;Assist needed for IADL  Required Assist For: Bathing;Meal preparation;Medication/finance management;Transportation/driving  History of Falls in Past 3 Months: Yes    Precautions  Precautions: Cervical collar on at all times;Back Safety  Comments: During session, doctor reports pt is ony able to ambulate a few feet today and needs to keer her head up.    Vision  Corrective Lenses: Wears glasses all of the time    ADL's  Where Assessed: Commode;Edge of Bed  Toileting Assist: Minimal Assist  Toileting Deficits: Steadying;Perineal Hygiene;Bedside Commode    ADL Mobility  Bed Mobility: Supine to Sit: Minimal assist  Bed Mobility: Sit to Supine: Minimal assist  Bed Mobility Comments: Able to complete log roll with verbal cues for technique and breathing.  Transfer Type: Sit to stand  Transfer: Assistance Level: From;Bed;Commode;Minimal assist  Transfer: Assistive Device: Roller walker  Transfer: Type of Assistance: For balance;For safety considerations;For strength deficit;Verbal cues  End of Activity Status: In bed;Instructed patient to request assist with mobility;Nursing notified  Gait Distance: 10 feet  Gait: Assistance Level: Minimal assist  Gait: Assistive Device: Roller walker  Gait Comments: Reports pain with transitions. Does well with cues for deep breathing. Requests to return to bed at end of session. Remains in bed with all needs met and bed alarm set. Encouraged pt to sit up in chair later this date with assist from staff.    Cognition  Overall Cognitive Status: WFL to Adequately Complete Self Care Tasks Safely  Cognition Comment: Pt anxious but doe well with redirection.    ROM  ROM Comments: WNL of cervical collar.    Education  Persons Educated: Patient  Barriers To Learning: Anxiety;Pain  Teaching Methods: Verbal Instruction  Patient Response: Verbalized Understanding  Topics: Role of OT, Goals for Therapy    Assessment  Assessment: Decreased ADL Status;Decreased UE ROM;Decreased UE Strength;Decreased Fine Motor Coordination;Decreased Self-Care Trans;Decreased High-Level ADLs    AM-PAC 6 Clicks Daily Activity Inpatient  Putting on and taking off regular lower body clothes: A Lot  Bathing (Including washing, rinsing, drying): A Lot  Toileting, which includes using toilet, bedpan, or urinal: A Little  Putting on and taking off regular upper body clothing: A Little  Taking care of personal grooming such as brushing teeth: None  Eating meals: None  Daily Activity Raw Score: 18  Standardized (T-scale) Score: 38.66    Plan  OT Frequency: 5x/week  OT Plan for Next Visit: lower body dressing with c-collar, toileting with pants ADLs at the sink    ADL Goals  Patient Will Perform All ADL's: w/ Stand By Assist    Functional Transfer Goals  Pt Will Perform All Functional Transfers: w/ Stand By Assist    OT Discharge Recommendations  Recommendation: Home with consistent supervision/assistance  Recommendation for Therapy Post Discharge: Home health  Patient Currently Requires Equipment: Owns what is needed    Therapist: Florence Canner, OTR/L [id number]  Date: 08/22/2023

## 2023-08-22 NOTE — Progress Notes
RT Adult Assessment Note    NAME:Kristen Vaughn             MRN: [id number]             DOB:Jun 25, 1961          AGE: 62 y.o.  ADMISSION DATE: 08/21/2023             DAYS ADMITTED: LOS: 0 days    Additional Comments:  Impressions of the patient: nad, on 2L, hx of OSA wears home cpap, regular use of bronchodilators  Intervention(s)/outcome(s): IS, O2, pulse ox, home cpap      Vital Signs:  Pulse: 82  RR: 16 PER MINUTE  SpO2: 96 %  O2 Device: Nasal cannula  Liter Flow: 2 Lpm  O2%:      Breath Sounds:   All Breath Sounds: Clear (Implies normal);Decreased  Respiratory Effort:   Respiratory WDL: Within Defined Limits  Comments:

## 2023-08-22 NOTE — Progress Notes
Orthopedic Spine Progress Note      A: Kristen Vaughn is a 62 y.o. 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 11/27    P:  -Diet: CLD ADAT  -Acute blood loss anemia: Monitor CBC's daily  -Continue current pain regimen  -PT/OT: Mobilize ad lib  - patient should mobilize with PT and nursing 11/28  -Lytes replaced PRN  -Bowel regimen  -Continue drain    VTE: Mechanical only.     DISPO: Continue inpatient, plain films of C-spine today, PT today    Azzie Roup, MD  [phone]    ----------------------------------------------------------------------------------------------------------------------------------  ----------------------------------------------------------------------------------------------------------------------------------    S: No acute events. Pain controlled. Denies leg pain. Tolerating diet (Diet Clear Liquid  Advance Diet as Tolerated). +flatus, -BM    O:   Blood pressure 119/64, pulse 76, temperature 36.7 ?C (98.1 ?F), height 170.2 cm (5' 7), weight 110.3 kg (243 lb 2.7 oz), SpO2 98%.   Body mass index is 38.09 kg/m?Marland Kitchen    Exam:   GEN: Alert. NAD   CV: Normal rate, regular rhythm   PULM: Non-labored   ABD: Soft, non-distended  EXTREM:     MOTOR:  Upper Ext. Deltoid Triceps Biceps Wrist Ext Wrist Flex Grip Interossei   Right 5 5 5 5 5 5 5    Left 5 5 5 5 5 5 5        SENSATION:  Upper extremity: Sensation intact to light touch and pin prick C5-T1 distributions        Dressings: Dressings clean, dry, and intact.     Pressure Injury: No pressure injury    Resulted Micro Last 72 Hrs    No results found         Output by Drain (mL) 08/20/23 0701 - 08/20/23 1900 08/20/23 1901 - 08/21/23 0700 08/21/23 0701 - 08/21/23 1900 08/21/23 1901 - 08/22/23 0700 08/22/23 0701 - 08/22/23 0813   Surgical Drain Bulb (e.g. JP) Anterior;Right Neck   50 23 10       Complete Blood Counts   Recent Labs     08/21/23  0713 08/22/23  0422   HGB 12.7 12.8   HCT 37.9 37.6   WBC 5.50 10.60   PLTCT 210 204        Chemistry Panel   Recent Labs     08/21/23  1100 08/22/23  0422   NA 140 139   K 4.7 5.0   CL  --  106   CO2  --  25   BUN  --  21   CR  --  1.06*   GLU 136* 140*   CA  --  8.5        Coagulation Studies   No results for input(s): PTT, INR in the last 72 hours.     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    Restless legs syndrome (RLS) 07/13/2022    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    Snoring 04/30/2022    Seasonal allergies 04/30/2022    Peripheral polyneuropathy 05/17/2021    Class 2 obesity in adult 05/26/2019    Family history of coronary artery disease 05/26/2019    Ambulates with cane 05/26/2019    Atypical chest pain 05/26/2019    S/P cervical spinal fusion 02/04/2019    Cervical stenosis of spine 10/28/2018    Tobacco abuse 10/24/2018    Preoperative cardiovascular examination 10/24/2018    Osteoporosis without current pathological fracture 10/15/2017    Cervical disc herniation 04/17/2017    Stenosis of cervical spine 04/17/2017    Cervical myelopathy (HCC) 04/17/2017    Cervical radiculopathy 04/17/2017    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

## 2023-08-23 MED ORDER — HYDROCODONE-ACETAMINOPHEN 5-325 MG PO TAB
1-2 | ORAL | 0 refills | Status: DC | PRN
Start: 2023-08-23 — End: 2023-08-26
  Administered 2023-08-23 – 2023-08-24 (×4): 2 via ORAL
  Administered 2023-08-24: 1 via ORAL
  Administered 2023-08-24 (×3): 2 via ORAL
  Administered 2023-08-24: 22:00:00 1 via ORAL
  Administered 2023-08-25 (×5): 2 via ORAL
  Administered 2023-08-26: 18:00:00 1 via ORAL
  Administered 2023-08-26 (×2): 2 via ORAL
  Administered 2023-08-26: 14:00:00 1 via ORAL

## 2023-08-23 NOTE — Care Plan
Problem: Tobacco Use  Goal: Knowledge of tobacco-use cessation methods  08/22/2023 2339 by Gloris Manchester, RN  Outcome: Goal Ongoing  08/22/2023 2339 by Gloris Manchester, RN  Outcome: Goal Ongoing  08/22/2023 2229 by Gloris Manchester, RN  Outcome: Goal Ongoing     Problem: Skin Integrity  Goal: Skin integrity intact  08/22/2023 2339 by Gloris Manchester, RN  Outcome: Goal Ongoing  08/22/2023 2339 by Gloris Manchester, RN  Outcome: Goal Ongoing  08/22/2023 2229 by Gloris Manchester, RN  Outcome: Goal Ongoing  Goal: Healing of skin (Wound & Incision)  08/22/2023 2339 by Gloris Manchester, RN  Outcome: Goal Ongoing  08/22/2023 2339 by Gloris Manchester, RN  Outcome: Goal Ongoing  08/22/2023 2229 by Gloris Manchester, RN  Outcome: Goal Ongoing  Goal: Healing of skin (Pressure Injury)  08/22/2023 2339 by Gloris Manchester, RN  Outcome: Goal Ongoing  08/22/2023 2339 by Gloris Manchester, RN  Outcome: Goal Ongoing  08/22/2023 2229 by Gloris Manchester, RN  Outcome: Goal Ongoing

## 2023-08-23 NOTE — Care Plan
Problem: Tobacco Use  Goal: Knowledge of tobacco-use cessation methods  08/23/2023 0649 by Gloris Manchester, RN  Outcome: Goal Ongoing  08/22/2023 2339 by Gloris Manchester, RN  Outcome: Goal Ongoing  08/22/2023 2339 by Gloris Manchester, RN  Outcome: Goal Ongoing  08/22/2023 2229 by Gloris Manchester, RN  Outcome: Goal Ongoing     Problem: Skin Integrity  Goal: Skin integrity intact  08/23/2023 0649 by Gloris Manchester, RN  Outcome: Goal Ongoing  08/22/2023 2339 by Gloris Manchester, RN  Outcome: Goal Ongoing  08/22/2023 2339 by Gloris Manchester, RN  Outcome: Goal Ongoing  08/22/2023 2229 by Gloris Manchester, RN  Outcome: Goal Ongoing  Goal: Healing of skin (Wound & Incision)  08/23/2023 0649 by Gloris Manchester, RN  Outcome: Goal Ongoing  08/22/2023 2339 by Gloris Manchester, RN  Outcome: Goal Ongoing  08/22/2023 2339 by Gloris Manchester, RN  Outcome: Goal Ongoing  08/22/2023 2229 by Gloris Manchester, RN  Outcome: Goal Ongoing  Goal: Healing of skin (Pressure Injury)  08/23/2023 0649 by Gloris Manchester, RN  Outcome: Goal Ongoing  08/22/2023 2339 by Gloris Manchester, RN  Outcome: Goal Ongoing  08/22/2023 2339 by Gloris Manchester, RN  Outcome: Goal Ongoing  08/22/2023 2229 by Gloris Manchester, RN  Outcome: Goal Ongoing

## 2023-08-23 NOTE — Progress Notes
Orthopedic Spine Progress Note      A: Kristen Vaughn is a 62 y.o. 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 11/27    P:  -Diet: CLD ADAT  -Acute blood loss anemia: Monitor CBC's daily  -Continue current pain regimen  -PT/OT: Mobilize ad lib  - patient should mobilize with PT and nursing 11/28  -Lytes replaced PRN  -Bowel regimen  -Continue drain    VTE: Mechanical only.     DISPO: Continue inpatient, continue drain    Azzie Roup, MD      ----------------------------------------------------------------------------------------------------------------------------------  ----------------------------------------------------------------------------------------------------------------------------------    S: No acute events. Pain controlled. Denies leg pain. Tolerating diet (Advance Diet as Tolerated  DIET REGULAR). +flatus, -BM    O:   Blood pressure 119/73, pulse 83, temperature 36.5 ?C (97.7 ?F), height 170.2 cm (5' 7), weight 110.3 kg (243 lb 2.7 oz), SpO2 96%.   Body mass index is 38.09 kg/m?Marland Kitchen    Exam:   GEN: Alert. NAD   CV: Normal rate, regular rhythm   PULM: Non-labored   ABD: Soft, non-distended  EXTREM:     MOTOR:  Upper Ext. Deltoid Triceps Biceps Wrist Ext Wrist Flex Grip Interossei   Right 5 5 5 5 5 5 5    Left 5 5 5 5 5 5 5        SENSATION:  Upper extremity: Sensation intact to light touch and pin prick C5-T1 distributions        Dressings: Dressings clean, dry, and intact.     Pressure Injury: No pressure injury       Microbiology - Resulted Micro Last 72 Hrs        CULTURE-WOUND/TISSUE/FLUID(AEROBIC ONLY)W/SENSITIVITY  Resulted: 08/23/23 0719, Result status: Preliminary result     Ordering provider: Criss Rosales, MD  08/21/23 0857 Resulting lab: [facility]   CLIA number: [id number]      Specimen Information      Source Collected On   Spine 08/21/23 0855              Components      Component Value Flag   Routine Culture No growth at 2 days  -- CULTURE-FUNGAL,OTHER  Resulted: 08/22/23 1001, Result status: Preliminary result     Ordering provider: Criss Rosales, MD  08/21/23 0857 Resulting lab: [facility]   CLIA number: [id number]      Specimen Information      Source Collected On   Spine 08/21/23 0855              Components      Component Value Flag   Fungus Culture No growth of fungus to date  --                    Output by Drain (mL) 08/20/23 0701 - 08/20/23 1900 08/20/23 1901 - 08/21/23 0700 08/21/23 0701 - 08/21/23 1900 08/21/23 1901 - 08/22/23 0700 08/22/23 0701 - 08/22/23 0813   Surgical Drain Bulb (e.g. JP) Anterior;Right Neck   50 23 10       Complete Blood Counts   Recent Labs     08/21/23  0713 08/22/23  0422 08/23/23  0624   HGB 12.7 12.8 12.6   HCT 37.9 37.6 37.9   WBC 5.50 10.60 7.10   PLTCT 210 204 188        Chemistry Panel   Recent Labs     08/21/23  1100 08/22/23  0422 08/23/23  0624   NA 140 139 140   K 4.7 5.0 4.4   CL  --  106 105   CO2  --  25 29   BUN  --  21 19   CR  --  1.06* 0.75   GLU 136* 140* 103*   CA  --  8.5 8.6        Coagulation Studies   No results for input(s): PTT, INR in the last 72 hours.     Renal Function: No abnormalities  Electrolyte Abnormalities:    Potassium: Normal   Sodium: Normal    Malnutrition Details:                                                   Body mass index is 38.09 kg/m?Marland Kitchen Acceptable (19 to <25)    Medical issues addressed this hospital stay:  Patient Active Problem List    Diagnosis Date Noted    Cervical spinal stenosis 08/21/2023    Degenerative disc disease, cervical 05/01/2023    Pseudoarthrosis of cervical spine (HCC) 05/01/2023    OSA (obstructive sleep apnea) 03/04/2023    Mild persistent asthma without complication 10/26/2022    Macromastia 09/26/2022    Restless legs syndrome (RLS) 07/13/2022    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    Snoring 04/30/2022    Seasonal allergies 04/30/2022    Peripheral polyneuropathy 05/17/2021    Class 2 obesity in adult 05/26/2019    Family history of coronary artery disease 05/26/2019    Ambulates with cane 05/26/2019    Atypical chest pain 05/26/2019    S/P cervical spinal fusion 02/04/2019    Cervical stenosis of spine 10/28/2018    Tobacco abuse 10/24/2018    Preoperative cardiovascular examination 10/24/2018    Osteoporosis without current pathological fracture 10/15/2017    Cervical disc herniation 04/17/2017    Stenosis of cervical spine 04/17/2017    Cervical myelopathy (HCC) 04/17/2017    Cervical radiculopathy 04/17/2017    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

## 2023-08-23 NOTE — Care Plan
 Problem: Tobacco Use  Goal: Knowledge of tobacco-use cessation methods  Outcome: Goal Ongoing     Problem: Skin Integrity  Goal: Skin integrity intact  Outcome: Goal Ongoing  Goal: Healing of skin (Wound & Incision)  Outcome: Goal Ongoing  Goal: Healing of skin (Pressure Injury)  Outcome: Goal Ongoing

## 2023-08-23 NOTE — Progress Notes
[facility] END OF SHIFT/ JHFRAT NOTE    Admission Date: 08/21/2023  Length of Stay: LOS: 2 days    Acute events, interventions, provider communication: to up to commode x2 assist.    Patient Interventions and Education  Fall Risk/JHFRAT Interventions and Education: (Charting when applicable)   Total Fall Risk Score: Total Fall Risk Score: 15.   Elimination Interventions : Commode at bedside  Medications : Bedside commode (i.e., urgency, frequency, dizziness) , Use of gait belt , and Consult the provider to decrease polypharmacy and unused medications   Patient Care Equipment: Needs assistance with patient care equipment when ambulating  Mobility: Assist x2  Cognition: N/A  Risk for Moderate/Major Injury: Risk for fracture  2. BMAT: Bedside Mobility Assessment Tool (BMAT)  Sit and Shake: Pass  Extend and Point: Pass  Stand: Fail (Yellow: stand, transfer and/or walk assisted), stop assessment    3. Restraints:  No     Restraint Goal: Patient will be free from injury while physically restrained.  See Docflowsheet for restraint documentation, interventions, education, etc.    Hygiene:     Bath/Shower: CHG surgical site care, CHG wipes shower/bath     Oral Care: Brush, Moisturize     Urinary Catheter / Perineal Care: Self    Intake and Output:           Last Bowel Movement Date:  (PTA),           Date 08/22/23 0701 - 08/23/23 0700 08/23/23 0701 - 08/24/23 0700   Shift 0701-1900 1901-0700 24 Hour Total 0701-1900 1901-0700 24 Hour Total   INTAKE   P.O. [phone]      I.V.(mL/kg/hr) 9.5(0)  9.5      Shift Total(mL/kg) 903.5(8.2) 236(2.1) 1139.5(10.3)      OUTPUT   Urine(mL/kg/hr) 900(0.7) 1125 2025        Urine 900 1125 2025      Drains 18 4 22         Drain Output (mL) (Surgical Drain Bulb (e.g. JP) Anterior;Right Neck) 18 4 22       Other           Stool Occurrence  0 x 0 x        Urine Occurrence  1 x 2 x 3 x      Shift Total(mL/kg) 918(8.3) 5188(41.6) 6063(01.6)      NET -14.5 -893 -907.5      Weight (kg) 110.3 110.3 110.3 110.3 110.3 110.3

## 2023-08-23 NOTE — Progress Notes
PHYSICAL THERAPY  ASSESSMENT      Name: Kristen Vaughn   MRN: [id number]     DOB: August 25, 1961      Age: 62 y.o.  Admission Date: 08/21/2023     LOS: 2 days     Date of Service: 08/23/2023    Mobility  Progressive Mobility Level: Walk in room  Distance Walked (feet): 25 ft  Level of Assistance: Assist X1  Assistive Device: Walker  Activity Limited By: Brendia Sacks    Subjective  Significant Hospital Events: 62 y.o. 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 11/27  Mental / Cognitive: Alert;Cooperative;Follows commands  Pain: Complains of pain  Pain level: Before activity;During activity  Pain Location: Incision  Pain Description: Aching  Pain Interventions: Patient agrees to participate in therapy with current pain level;Patient assisted into position of comfort  Persons Present: Nursing Staff (at end of session)    Home Living Situation  Lives With: Alone  Type of Home: Apartment  Entry Stairs: No stairs;Elevator  In-Home Stairs: Able to live on main level  Patient Owned Equipment: Walker with wheels;Bathing: Shower chair;Cane: Single point  Comments: Pt reports using walker at baseline. Has a caregiver during the day who assists with meal prep and med management. Pt requesting night time caregiver.    Prior Level of Function  Level Of Independence: Independent with ADL and community mobility without device;Assist needed for IADL  Required Assist For: Bathing;Meal preparation;Medication/finance management;Transportation/driving  History of Falls in Past 3 Months: Yes    Precautions  Precautions: Cervical collar on at all times;Back Safety    ROM  R LE ROM: WFL  L LE ROM: WFL    Strength  Overall Strength: Generalized weakness    Posture/Neurological  Head Control: Cervical Collar in Place  Posture: Rounded shoulders    Bed Mobility/Transfer  Bed Mobility: Supine to Sit: Minimal Assist  Bed Mobility: Sit to Supine: Minimal Assist  Transfer Type: Sit to Stand  Transfer: Assistance Level: To/From;Bed;Minimal Assist  Transfer: Assistive Device: Nurse, adult  Transfers: Type Of Assistance: Materials engineer;For Balance;For Strength Deficit;For Safety Considerations  End Of Activity Status: In Bed;Nursing Notified;Instructed Patient to Request Assist with Mobility;Instructed Patient to Use Call Light    Balance  Sitting Balance: Static Sitting Balance;Dynamic Sitting Balance;Standby Assist  Standing Balance: Static Standing Balance;Dynamic Standing Balance;2 UE support;Minimal Assist (brief bouts of moderate assist)    Gait  Gait Distance: 25 feet  Gait: Assistance Level: Minimal Assist;Moderate Assist  Gait: Assistive Device: Roller Walker  Gait: Descriptors: Antalgic;Pace: Slow;Decreased knee extension in stance phase RLE;Decreased knee extension in stance phase LLE;Decreased step length;Loss of balance;Scissoring  Comments: Patient ambulates with unsteady gait pattern, requiring minimal to moderate assist for steadying/safety. Mild knee buckling bilaterally.  Activity Limited By: Weakness    Education  Persons Educated: Patient  Patient Barriers To Learning: None Noted  Teaching Methods: Verbal Instruction  Patient Response: Verbalized Understanding  Topics: Plan/Goals of PT Interventions;Mobility Progression;Safety Awareness;Up with Assist Only;Importance of Increasing Activity;Ambulate With Nursing;Recommend Continued Therapy    Assessment/Progress  Impaired Mobility Due To: Decreased Strength;Impaired Balance;Safety Concerns;Decreased Activity Tolerance;Medical Status Limitation  Assessment/Progress: Should Improve w/ Continued PT    AM-PAC 6 Clicks Basic Mobility Inpatient  Turning from your back to your side while in a flat bed without using bed rails: A Little  Moving from lying on your back to sitting on the side of a flat bed without using bedrails : A Little  Moving to and from a bed to  a chair (including a wheelchair): A Little  Standing up from a chair using your arms (e.g. wheelchair, or bedside chair): A Little  To walk in hospital room: A Lot  Climbing 3-5 steps with a railing: Total  Basic Mobility Inpatient Raw Score: 15  Standardized (T-scale) Score: 36.97    Goals  Goal Formulation: With Patient  Time For Goal Achievement: 5 days  Patient Will Go Supine To/From Sit: w/ Stand By Assist  Patient Will Transfer Sit to Stand: w/ Stand By Assist  Patient Will Ambulate: Greater than 200 Feet, w/ Walker, w/ Stand By Assist    Plan  Treatment Interventions: Mobility training;Strengthening;Balance activities;Endurance training  Plan Frequency: 5 Days per Week  PT Plan for Next Visit: Progress independence with bed mobility and transfers, increase gait distance    PT Discharge Recommendations  Recommendation: Inpatient setting  Patient Currently Requires Physical Assist With: All mobility    Therapist  Delia Chimes, PT  Date  08/23/2023

## 2023-08-24 NOTE — Progress Notes
PHYSICAL THERAPY  PROGRESS NOTE          Name: Kristen Vaughn   MRN: [id number]     DOB: 1961/01/12      Age: 62 y.o.  Admission Date: 08/21/2023     LOS: 3 days     Date of Service: 08/24/2023      Mobility  Patient Turn/Position: Self  Progressive Mobility Level: Walk in hallway  Distance Walked (feet): 250 ft  Level of Assistance: Assist X1  Assistive Device: Walker  Activity Limited By: Brendia Sacks    Subjective  Reason for Admission and Past Medical Hx: 62 y.o. 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 11/27  Special Considerations:  (JP drain)  Mental / Cognitive: Alert;Cooperative;Follows commands  Pain: Complains of pain;Does not rate pain;Demonstrates non-verbal signs of pain  Pain level: Before activity;During activity;After activity  Pain Location: Incision;Shoulder  Pain Description: Aching  Pain Interventions: Patient agrees to participate in therapy with current pain level;Patient assisted into position of comfort    Home Living Situation  Lives With: Alone  Type of Home: Apartment  Entry Stairs: No stairs;Elevator  In-Home Stairs: Able to live on main level  Patient Owned Equipment: Walker with wheels;Bathing: Shower chair;Cane: Single point  Comments: Pt reports using walker at baseline. Has a caregiver during the day who assists with meal prep and med management. Pt requesting night time caregiver.    Prior Level of Function  Level Of Independence: Independent with ADL and community mobility without device;Assist needed for IADL  Required Assist For: Bathing;Meal preparation;Medication/finance management;Transportation/driving  History of Falls in Past 3 Months: Yes    Precautions  Precautions: Cervical collar on at all times;Back Safety    ROM  R LE ROM: WFL  L LE ROM: WFL  ROM Comments: WNL of cervical collar.    Strength  Overall Strength: Generalized weakness    Posture/Neurological  Head Control: Cervical Collar in Place  Posture: Rounded shoulders    Bed Mobility/Transfer  Bed Mobility: Supine to Sit: Standby Assist;Head of Bed Elevated;Use of Rail;Requires Extra Time  Bed Mobility: Sit to Supine: Minimal Assist;HOB Elevated;Use of Rail;Requires Extra Time;Assist with B LE  Transfer Type: Sit to/from Stand  Transfer: Assistance Level: To/From;Bed;Standby Assist  Transfer: Assistive Device: Nurse, adult  Transfers: Type Of Assistance: Materials engineer;For Balance;For Strength Deficit;For Safety Considerations  End Of Activity Status: In Bed;Instructed Patient to Request Assist with Mobility;Instructed Patient to Use Call Light  Comments: Pt in bed, call light in reach, bed alarm ACTIVE, and all needs met upon PT departure.    Balance  Sitting Balance: Static Sitting Balance;Dynamic Sitting Balance;Standby Assist  Standing Balance: Static Standing Balance;Dynamic Standing Balance;2 UE support;Minimal Assist    Gait  Gait Distance: 250 feet  Gait: Assistance Level: Minimal Assist  Gait: Assistive Device: Roller Walker  Gait: Descriptors: Antalgic;Pace: Slow;Decreased knee extension in stance phase RLE;Decreased knee extension in stance phase LLE;Decreased step length;Loss of balance;Scissoring  Comments: Patient ambulates with unsteady gait pattern, requiring minimal assist for steadying/safety. Mild knee buckling bilaterally.  Pt with increased endurance compared to previous session.  Activity Limited By: Weakness    Education  Persons Educated: Patient  Patient Barriers To Learning: None Noted  Teaching Methods: Verbal Instruction  Patient Response: Verbalized Understanding  Topics: Plan/Goals of PT Interventions;Mobility Progression;Safety Awareness;Up with Assist Only;Importance of Increasing Activity;Ambulate With Nursing;Recommend Continued Therapy    Assessment/Progress  Impaired Mobility Due To: Decreased Strength;Impaired Balance;Safety Concerns;Decreased Activity Tolerance;Medical Status Limitation  Impaired Strength Due  To: Pain  Assessment/Progress: Should Improve w/ Continued PT  Comments: Pt with good tolerance of PT on this date.  Pt still reporting pain, but pt motivated to participate in therapy.  Pt typically ambulates independently with rollator, but needs Ax1 on this date for safe mobility and ambulation.  Pt will benefit from continued PT to address mobility deficits.    AM-PAC 6 Clicks Basic Mobility Inpatient  Turning from your back to your side while in a flat bed without using bed rails: A Little  Moving from lying on your back to sitting on the side of a flat bed without using bedrails : A Little  Moving to and from a bed to a chair (including a wheelchair): A Little  Standing up from a chair using your arms (e.g. wheelchair, or bedside chair): A Little  To walk in hospital room: A Little  Climbing 3-5 steps with a railing: Total  Basic Mobility Inpatient Raw Score: 16  Standardized (T-scale) Score: 38.32    Goals  Goal Formulation: With Patient  Time For Goal Achievement: 4 days  Patient Will Go Supine To/From Sit: w/ Stand By Assist, Ongoing  Patient Will Transfer Sit to Stand: w/ Stand By Assist, Ongoing  Patient Will Ambulate: Greater than 200 Feet, w/ Walker, w/ Stand By Assist, Ongoing    Plan  Treatment Interventions: Mobility training;Strengthening;Balance activities;Endurance training  Plan Frequency: 5 Days per Week  PT Plan for Next Visit: Progress independence with bed mobility and transfers, increase gait distance    PT Discharge Recommendations  Recommendation: Inpatient setting  Patient Currently Requires Physical Assist With: All mobility      Therapist: Orvis Brill, PT, DPT  Date: 08/24/2023

## 2023-08-24 NOTE — Progress Notes
Orthopedic Spine Progress Note      A: Kristen Vaughn is a 62 y.o. 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 11/27    P:  -Diet: CLD ADAT  -Acute blood loss anemia: Monitor CBC's daily  -Continue current pain regimen  -PT/OT: Mobilize ad lib  - patient should mobilize with PT and nursing 11/28  -Lytes replaced PRN  -Bowel regimen  -Continue drain    VTE: Mechanical only.     DISPO: Continue inpatient, continue drain    Azzie Roup, MD      ----------------------------------------------------------------------------------------------------------------------------------  ----------------------------------------------------------------------------------------------------------------------------------    S: No acute events. Slightly tearful due to pain but in positive spirits. Denies leg pain. Tolerating diet (Advance Diet as Tolerated  DIET REGULAR). +flatus, -BM    O:   Blood pressure 105/67, pulse 71, temperature 36.4 ?C (97.5 ?F), height 170.2 cm (5' 7), weight 110.3 kg (243 lb 2.7 oz), SpO2 96%.   Body mass index is 38.09 kg/m?Marland Kitchen    Exam:   GEN: Alert. Mildly tearful   CV: Normal rate, regular rhythm   PULM: Non-labored   ABD: Soft, non-distended  EXTREM:     MOTOR:  Upper Ext. Deltoid Triceps Biceps Wrist Ext Wrist Flex Grip Interossei   Right 5 5 5 5 5 5 5    Left 5 5 5 5 5 5 5        SENSATION:  Upper extremity: Sensation intact to light touch and pin prick C5-T1 distributions        Dressings: Dressings clean, dry, and intact.     Pressure Injury: No pressure injury       Microbiology - Resulted Micro Last 72 Hrs        CULTURE-WOUND/TISSUE/FLUID(AEROBIC ONLY)W/SENSITIVITY  Resulted: 08/24/23 0651, Result status: Preliminary result     Ordering provider: Criss Rosales, MD  08/21/23 0857 Resulting lab: [facility]   CLIA number: [id number]      Specimen Information      Source Collected On   Spine 08/21/23 0855              Components      Component Value Flag   Routine Culture No growth at 3 days  --                  CULTURE-ANAEROBIC  Resulted: 08/23/23 1230, Result status: Preliminary result     Ordering provider: Criss Rosales, MD  08/21/23 0857 Resulting lab: [facility]   CLIA number: [id number]      Specimen Information      Source Collected On   Spine 08/21/23 0855              Components      Component Value Flag   Anaerobe Culture No anaerobes to date  --                  CULTURE-FUNGAL,OTHER  Resulted: 08/22/23 1001, Result status: Preliminary result     Ordering provider: Criss Rosales, MD  08/21/23 0857 Resulting lab: [facility]   CLIA number: [id number]      Specimen Information      Source Collected On   Spine 08/21/23 0855              Components      Component Value Flag   Fungus Culture No growth of fungus to date  --  Output by Drain (mL) 08/20/23 0701 - 08/20/23 1900 08/20/23 1901 - 08/21/23 0700 08/21/23 0701 - 08/21/23 1900 08/21/23 1901 - 08/22/23 0700 08/22/23 0701 - 08/22/23 0813   Surgical Drain Bulb (e.g. JP) Anterior;Right Neck   50 23 10       Complete Blood Counts   Recent Labs     08/22/23  0422 08/23/23  0624   HGB 12.8 12.6   HCT 37.6 37.9   WBC 10.60 7.10   PLTCT 204 188        Chemistry Panel   Recent Labs     08/21/23  1100 08/22/23  0422 08/23/23  0624   NA 140 139 140   K 4.7 5.0 4.4   CL  --  106 105   CO2  --  25 29   BUN  --  21 19   CR  --  1.06* 0.75   GLU 136* 140* 103*   CA  --  8.5 8.6        Coagulation Studies   No results for input(s): PTT, INR in the last 72 hours.     Renal Function: No abnormalities  Electrolyte Abnormalities:    Potassium: Normal   Sodium: Normal    Malnutrition Details:                                                   Body mass index is 38.09 kg/m?Marland Kitchen Acceptable (19 to <25)    Medical issues addressed this hospital stay:  Patient Active Problem List    Diagnosis Date Noted    Cervical spinal stenosis 08/21/2023    Degenerative disc disease, cervical 05/01/2023    Pseudoarthrosis of cervical spine (HCC) 05/01/2023    OSA (obstructive sleep apnea) 03/04/2023    Mild persistent asthma without complication 10/26/2022    Macromastia 09/26/2022    Restless legs syndrome (RLS) 07/13/2022    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    Snoring 04/30/2022    Seasonal allergies 04/30/2022    Peripheral polyneuropathy 05/17/2021    Class 2 obesity in adult 05/26/2019    Family history of coronary artery disease 05/26/2019    Ambulates with cane 05/26/2019    Atypical chest pain 05/26/2019    S/P cervical spinal fusion 02/04/2019    Cervical stenosis of spine 10/28/2018    Tobacco abuse 10/24/2018    Preoperative cardiovascular examination 10/24/2018    Osteoporosis without current pathological fracture 10/15/2017    Cervical disc herniation 04/17/2017    Stenosis of cervical spine 04/17/2017    Cervical myelopathy (HCC) 04/17/2017    Cervical radiculopathy 04/17/2017    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

## 2023-08-24 NOTE — Progress Notes
BH43 END OF SHIFT/ JHFRAT NOTE    Admission Date: 08/21/2023  Length of Stay: LOS: 3 days    Acute events, interventions, provider communication: N/A    Patient Interventions and Education  Fall Risk/JHFRAT Interventions and Education: (Charting when applicable)   Total Fall Risk Score: Total Fall Risk Score: 13.   Elimination Interventions : Commode at bedside  Medications : Educate patient on medication side effects  Patient Care Equipment: Needs assistance with patient care equipment when ambulating and Ensure environment is free of clutter and walkways are clear from tripping hazards  Mobility: Assist x1 and Utilize walker, cane, or additional walking aid for ambulation  Cognition: N/A  Risk for Moderate/Major Injury: N/A  2. BMAT: Bedside Mobility Assessment Tool (BMAT)  Sit and Shake: Pass  Extend and Point: Pass  Stand: Fail (Yellow: stand, transfer and/or walk assisted), stop assessment    3. Restraints:  No     Restraint Goal: Patient will be free from injury while physically restrained.  See Docflowsheet for restraint documentation, interventions, education, etc.    Hygiene:     Bath/Shower: CHG wipes shower/bath     Oral Care: Brush, Moisturize     Urinary Catheter / Perineal Care: Self    Intake and Output:           Last Bowel Movement Date: 08/23/23, Stool Appearance: Formed, Loose         Date 08/23/23 0701 - 08/24/23 0700 08/24/23 0701 - 08/25/23 0700   Shift 0701-1900 1901-0700 24 Hour Total 0701-1900 1901-0700 24 Hour Total   INTAKE   P.O. 500  500      Shift Total(mL/kg) 500(4.5)  500(4.5)      OUTPUT   Urine(mL/kg/hr) 500(0.4) 300 800        Urine 500 300 800      Drains  5 5        Drain Output (mL) (Surgical Drain Bulb (e.g. JP) Anterior;Right Neck)  5 5      Other           Stool Occurrence 1 x 1 x 2 x        Urine Occurrence  1 x 1 x 2 x      Shift Total(mL/kg) 500(4.5) 305(2.8) 805(7.3)      NET 0 -305 -305      Weight (kg) 110.3 110.3 110.3 110.3 110.3 110.3

## 2023-08-24 NOTE — Consults
ATTESTATION    I personally observed the resident performing the E/M, discussed case with resident, and concur with resident documentation of history, physical assessment and treatment plan unless otherwise noted.    In sum this is a 62 year old female with cervical spine stenosis who is now status post C65-C7 corpectomy and posterior fusion on 11/27.  Patient was seen by me and physician resident I will agree that she would benefit from a short stay in IPR.    Staff name:  Ronny Flurry, MD Date: 08/24/2023         Physical Medicine & Rehabilitation Consult Service    Name: Kristen Vaughn   MRN: [id number]     DOB: 1961-08-20      Age: 62 y.o.  Admission Date: 08/21/2023     LOS: 3 days     Date of Service: 08/24/2023        Date of Service: 08/24/2023  Financial Class: Payor: [employer] MEDICARE / Plan: [employer] MEDICARE HMO / Product Type: Medicare /   Referring Physician: Criss Rosales, MD  Reason for Consult: evaluate for Post-Acute Rehab/Placement  Precautions: Fall, spine - cervical  Weight Bearing Precautions: none noted per Ortho surgery at this time    Assessment & Plan:  Principal Problem:    Cervical spinal stenosis  Active Problems:    Cervical radiculopathy    Cervical stenosis of spine    Pseudoarthrosis of cervical spine (HCC)    Cervical Stenosis s/p revision C5-7 corpectomy and posterior fusion (11/27)  Acute Pain  History of Falls  Gait abnormality  Impaired mobility/ADLs  Impaired transfers         Kristen Vaughn is a 62 y.o. year old female admitted to [facility] on 08/21/2023 with the following issues:    stenosis - cervical, s/p revision C5-7 corpectomy and posterior fusion (11/27)    Impairments: loss of coordination, neurogenic bladder, neurogenic bowel, pain, sensory loss, and weakness  Activity Limitations:  bathing, dressing - upper, dressing - lower, toileting, transfers, ambulation, and stairs  Participation Restrictions: unable to return home safely  Family / Patient Dispositional Goals: return home alone    Overall Functional Goals  Gait and mobility SBA   Transfers SBA   Upper body dressing Independent   Lower body dressing Independent   Toileting SBA   Bathing SBA   Cognition / Communication Cognition grossly intact and Speech intact          Recommendations:  Post-acute care rehabilitation needs: acute inpatient rehabilitation  Patient?s medical complexity warrants daily physician oversight and functional goals consistent with intensive rehabilitation in acute inpatient rehabilitation.       Barriers/Facilitators:  Barriers: Uncontrolled pain and unknown caregiver plan   Facilitators: good home setup, patient motivation, and improving strength / endurance  Rehabilitation Prognosis: Good    Tolerance for three hours of therapy a day: Good    Prior to the inpatient rehabilitation admission complete the following:   *Endurance The patient will need to be clearly able to or reasonably expected to be able to endure 3 hours of constructive therapy per day.  This will need to be determined prior to considering admission to acute inpatient rehabilitation.  *IV Pain The patient will need to be transitioned off all IV pain medications and have good pain control with oral analgesics prior to considering acute inpatient rehabilitation.    Provide clear guidelines of VTE mechanical vs chemoprophylaxis as well as activity restrictions,  incision care.  Please schedule post-surgical follow up prior to rehab discharge.   ____________________________________________________________________________    Impaired gait/mobility/transfers:  The patient will benefit from continued work with PT to address mobility deficits    Impaired ADLs:  The patient will benefit from ongoing OT to address functional deficits    Thank you for this consultation.  Please call our consult pager with questions or concerns.    Elenore Paddy Fortune, DO  On Voalte    History of Present Illness:    CC: evaluate for post-hospital rehab placement    Hospital Course: Kristen Vaughn is a pleasant 62 y.o. female with PMH of GERD, HLD, fibromyalgia, pulmonary emphysema, OSA, HTN, anxiety, depression, cervical stenosis s/p ACDF C6-7 with pseudoarthrosis and adjacent level stenosis C5-6, who was admitted for revision C5-C7 corpectomy and posterior fusion 11/27 with Orthopedic Surgery- Dr. Liam Rogers. Hospital course complicated by acute pain. Rehab team consulted for evaluation for post-discharge rehabilitation recommendation.  Patient is feeling well today however does have pain in neck and bilateral shoulders. She does report shooting pain in her left hand. She denies diarrhea or constipation. Last bowel movement 11/30.    The primary team has consulted PT and OT, and patient will continue working with therapies to address functional and mobility deficits, rehab is now consulted for post-acute rehab/placement recommendations.     Patient lives in apartment with no steps to enter. Patient previously had private caregiver during the day. She is hoping she could have some help at home at night if she is discharging home, which she states she has been in discussion with SW about. She is open to a stay in inpatient rehab if this would benefit her and improve her function.      The patient's family/social support consists of: lives alone, caregiver at home.    Past Medical History:   Diagnosis Date    Allergy     Anxiety and depression     Arthritis     Asthma     Brain bleed [facility]) 2023    [facility], [facility] well    Breast disorder     Carpal tunnel syndrome     Cervical myelopathy (HCC)     Cervical stenosis of spine     Chest pain     Constipation     Degenerative disc disease, cervical     Dizziness 2023    Essential hypertension     Fibromyalgia     Generalized headaches     GERD (gastroesophageal reflux disease)     Herniated disc, cervical     History of abnormal electrocardiogram 01/2016    History of colon polyps 2015    History of MRSA infection 2019    right hand/[facility]    Hyperlipemia 08/09/2009    Joint pain     Muscle disease or syndrome     Fibromyalgia    Nerve injury     Obesity     Osteoporosis     Pneumonia     Pulmonary emphysema (HCC)     Sleep apnea     Spinal headache     Spinal stenosis     Varicose veins         Surgical History:   Procedure Laterality Date    HX HYSTERECTOMY  1985    UPPER GASTROINTESTINAL ENDOSCOPY  2015    COLONOSCOPY  2015    hx colon polyps x2    CARDIOVASCULAR STRESS TEST  01/2016    FINGER  TRIGGER RELEASE Right 2019    CARPAL TUNNEL RELEASE Right 2019    ANTERIOR CERVICAL DECOMPRESSION AND FUSION AT CERVICAL 6-7 N/A 10/28/2018    Performed by Criss Rosales, MD at [facility] OR    ARTHRODESIS SPINE ANTERIOR INTERBODY WITH DISCECTOMY/ OSTEOPHYTECTOMY/ DECOMPRESSION - CERVICAL BELOW C2 - EACH ADDITIONAL INTERSPACE N/A 10/28/2018    Performed by Criss Rosales, MD at [facility] OR    ANTERIOR INSTRUMENTATION - 2 TO 3 VERTEBRAL SEGMENTS N/A 10/28/2018    Performed by Criss Rosales, MD at [facility] OR    ABDOMEN SURGERY      DILATION AND CURETTAGE  1980's    HX TUBAL LIGATION      LAP CHOLECYSTECTOMY      SPINE SURGERY      TILT TABLE STUDY          Social History     Socioeconomic History    Marital status: Single   Tobacco Use    Smoking status: Former     Current packs/day: 0.00     Average packs/day: 0.3 packs/day for 40.0 years (10.0 ttl pk-yrs)     Types: Cigarettes     Quit date: 03/12/2023     Years since quitting: 0.4     Passive exposure: Current    Smokeless tobacco: Never    Tobacco comments:     variable 1-1/2 a day, usually 1 or 2 per day.   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    Sexual activity: Not Currently     Partners: Male     Birth control/protection: Condom, None       Family History   Problem Relation Name Age of Onset    Coronary Artery Disease Mother Doylene Bode     Diabetes Mother Doylene Bode     Coronary Artery Disease Father Judee Clara     Arthritis Father Judee Clara     Hypertension Father Judee Clara     Coronary Artery Disease Other      Premature Heart Disease Other      Stroke Other      Coronary Artery Disease Other      Cancer Other      Cancer Sister Aunyae Springett     Neck Pain Sister Margrett Rud     Osteoporosis Sister Margrett Rud     Alcohol liver disease Brother Arne Cleveland     Diabetes Brother Arne Cleveland     Joint Pain Sister Ulyess Blossom     Asthma Sister Ulyess Blossom     Cancer Sister Lana     Back pain Brother Charm Rings         Scheduled Meds:celecoxib (CeleBREX) capsule 200 mg, 200 mg, Oral, QDAY  chlorhexidine gluconate (PERIDEX) 0.12 % solution 15 mL, 15 mL, Swish & Spit, BID  clonazePAM (KlonoPIN) tablet 1 mg, 1 mg, Oral, QDAY  docusate (COLACE) capsule 100 mg, 100 mg, Oral, BID  FLUoxetine (PROzac) capsule 40 mg, 40 mg, Oral, QDAY  fluticasone furoate-vilanteroL (BREO ELLIPTA) 100-25 mcg/actuation inhaler 1 puff, 1 puff, Inhalation, QDAY  lisinopriL (ZESTRIL) tablet 40 mg, 40 mg, Oral, QDAY  milk of magnesium oral suspension 30 mL, 30 mL, Oral, QDAY  mirtazapine (REMERON) tablet 30 mg, 30 mg, Oral, QHS  pantoprazole DR (PROTONIX) tablet 40 mg, 40 mg, Oral, QDAY(21)  polyethylene glycol 3350 (MIRALAX) packet 17 g, 1 packet, Oral, BID  pregabalin (LYRICA) capsule 200 mg, 200 mg, Oral, TID  rosuvastatin (CRESTOR) tablet 10 mg, 10 mg, Oral, QDAY  terbinafine HCL (LamISIL) tablet 250 mg, 250 mg, Oral, QDAY  varenicline (CHANTIX) tablet 1 mg, 1 mg, Oral, BID    Continuous Infusions:  PRN and Respiratory Meds:albuterol sulfate Q6H PRN, bisacodyL QDAY PRN, cyclobenzaprine Q8H PRN, diphenhydrAMINE HCL Q6H PRN **OR** diphenhydrAMINE HCL Q6H PRN, eucalyptus-menthoL PRN, fentaNYL citrate PF Q1H PRN, HYDROcodone/acetaminophen Q4H PRN, ondansetron (ZOFRAN) IV Q6H PRN       Allergies   Allergen Reactions    Erythromycin RASH     Rash on trunk    Strawberry RASH    Ascorbic Acid NAUSEA ONLY and UNKNOWN            Medical Marijuana    Product Type Edible     Route Oral     Current Frequency Few times per month     Indication Pain     Product Type Comment gummy     Max Frequency Few times per month        Prior Level of Function: had assistance from caregiver for various ADLs at home.     Gait and mobility Uses walker at baseline   Transfers Independent   Upper body dressing Independent   Lower body dressing Independent   Toileting Minimum assistance   Bathing Uses shower chair, required assistance           Home Environment:  No data recorded  No data recorded  Type of Home: Apartment (08/24/2023  9:00 AM)    Entry Stairs: No stairs; Elevator (08/24/2023  9:00 AM)    In-Home Stairs: Able to live on main level (08/24/2023  9:00 AM)    No data recorded  No data recorded        Current Level Of Function:  PT Gait:Gait Distance: 250 feet Gait: Assistance Level: Minimal Assist Gait: Assistive Device: Roller Walker  Bed Mobility/Transfers  Bed Mobility: Supine to Sit: Standby Assist, Head of Bed Elevated, Use of Rail, Requires Extra Time  Bed Mobility: Sit to Supine: Minimal Assist, HOB Elevated, Use of Rail, Requires Extra Time, Assist with B LE  Transfer Type: Sit to/from Stand  Transfer: Assistance Level: To/From, Bed, Standby Assist  Transfer: Assistive Device: Nurse, adult  Transfers: Type Of Assistance: Materials engineer, For Balance, For Strength Deficit, For Safety Considerations  End Of Activity Status: In Bed, Instructed Patient to Request Assist with Mobility, Instructed Patient to Use Call Light  Comments: Pt in bed, call light in reach, bed alarm ACTIVE, and all needs met upon PT departure.   OT ADL's  Where Assessed: Commode, Edge of Bed  Toileting Assist: Minimal Assist  Toileting Deficits: Steadying, Perineal Hygiene, Bedside Commode   SLP COGNITIVE EVALUATION SUMMARY     PRAGMATICS:    BEHAVIOR:    AUDITORY COMPREHENSION:      ORIENTATION:    AUDITORY ATTENTION/WORKING MEMORY:    AUDITORY MEMORY/SUSTAINED ATTENTION:    NEW LEARNING:    SEQUENCING/ORGANIZATION:    PROBLEM SOLVING:    REASONING:      MATH/MONEY SKILLS:      VISUAL PERCEPTUAL:    SWALLOW  EVALUATION SUMMARY                       Review of Systems:    A 14 point review of systems was negative except for: that noted in the HPI      Physical Exam:    BP: 105/67 (11/30 0757)  Temp: 36.4 ?C (97.5 ?F) (11/30 0757)  Pulse: 72 (11/30 1000)  Respirations: 18 PER MINUTE (11/30 0757)  SpO2: 98 % (11/30 1000)  O2 Device: None (Room air) (11/30 1000)  O2 Liter Flow: 2 Lpm (11/30 0440)  Body mass index is 38.09 kg/m?.     Gen: Alert & Oriented X 3, lying in bed in no acute distress  HEENT: EOMI, conjunctivae clear  Neck: aspen c-collar in place. One JP drain with serosanguinous fluid  Heart: Extremities well perfused  Lungs: non labored breathing  Abdomen: Soft, non-tender, non-distended  GU:  - Foley  Skin: surgical incision without bleeding covered in dressing that is clean, dry, intact . No further gross lesions appreciated  Ext: purposeful movement of extremities   MS:    Root Right Left   Shoulder Abduction C5 4 4   Elbow Flexion C5 4 4   Elbow Extension C7 4 4   Wrist Extension C6 5 5   Finger Flexion C8 5 5   Finger Abduction T1 5 5   Hip Flexion L2 4 4   Knee Flexion L5/S1 5 5   Knee Extension L3 5 5   Dorsiflexion L4 5 5   Plantarflexion S1 5 5   EHL Extension L5 5 5     Neuro:  Cranial Nerves Cranial Nerves 2-12 are grossly intact   Upper Extremity Sensation Intact to light touch bilaterally in all dermatomes   Lower Extremity Sensation Intact to light touch bilaterally   Memory/Cognition/Speech Within limits, speech clear and fluid           Intake/Output Summary (Last 24 hours) at 08/24/2023 1230  Last data filed at 08/24/2023 0757  Gross per 24 hour   Intake 0 ml   Output 806 ml   Net -806 ml        Hematology:    Lab Results   Component Value Date    HGB 12.6 08/23/2023    HGB 12.7 01/08/2023    HCT 37.9 08/23/2023    HCT 38.6 01/08/2023    PLTCT 188 08/23/2023    PLTCT 226 01/08/2023    WBC 7.10 08/23/2023    WBC 6.24 01/08/2023    NEUT 59 10/06/2018    ANC 3.60 10/06/2018    ALC 1.70 10/06/2018    MONA 7 10/06/2018    AMC 0.40 10/06/2018    EOSA 4 10/06/2018    ABC 0.00 10/06/2018    MCV 96.0 08/23/2023    MCV 95.5 01/08/2023    MCH 32.0 08/23/2023    MCH 31.4 01/08/2023    MCHC 33.3 08/23/2023    MCHC 32.9 01/08/2023    MPV 8.9 08/23/2023    MPV 10.8 01/08/2023    RDW 13.8 08/23/2023    RDW 13.6 01/08/2023   , Coagulation:    Lab Results   Component Value Date    PT 10.3 07/31/2023    PTT 30.4 07/31/2023    INR 0.9 07/31/2023    and General Chemistry:    Lab Results   Component Value Date    NA 140 08/23/2023    NA 140 08/21/2023  NA 139 04/02/2023    K 4.4 08/23/2023    K 4.7 08/21/2023    K 5.0 04/02/2023    CL 105 08/23/2023    CL 107 04/02/2023    CO2 29 08/23/2023    CO2 24.0 04/02/2023    GAP 5 08/23/2023    GAP 8 04/02/2023    BUN 19 08/23/2023    BUN 24.9 04/02/2023    CR 0.75 08/23/2023    CR 0.90 04/02/2023    GLU 103 08/23/2023    GLU 136 08/21/2023    GLU 108 04/02/2023    CA 8.6 08/23/2023    CA 9.4 04/02/2023    ALBUMIN 3.8 04/02/2023    OBSCA 1.23 08/21/2023    TOTBILI 0.20 04/02/2023        Radiology:  Reviewed     Elenore Paddy Fortune, DO  On Voalte

## 2023-08-24 NOTE — Progress Notes
RT Adult Assessment Note    NAME:Kristen Vaughn             MRN: [id number]             DOB:1961-09-13          AGE: 63 y.o.  ADMISSION DATE: 08/21/2023             DAYS ADMITTED: LOS: 3 days    Additional Comments:  Impressions of the patient: no signs of distress  Intervention(s)/outcome(s): MDI given  Patient education that was completed: OSA talked about trying a different mask for CPAP while having the c-collar on   Recommendations to the care team: n/a    Vital Signs:  Pulse: 72  RR:    SpO2: 98 %  O2 Device: None (Room air)  Liter Flow:    O2%:      Breath Sounds:   Breath Sounds WDL: Within Defined Limits  Respiratory Effort:   Respiratory WDL: Within Defined Limits  Comments:

## 2023-08-24 NOTE — Progress Notes
BH43 END OF SHIFT/ JHFRAT NOTE    Admission Date: 08/21/2023  Length of Stay: LOS: 2 days    Acute events, interventions, provider communication: Pain regimen adjusted. Pt had BM today.    Patient Interventions and Education  Fall Risk/JHFRAT Interventions and Education: (Charting when applicable)   Total Fall Risk Score: Total Fall Risk Score: 15.   Elimination Interventions : Commode at bedside  Medications : Bedside commode (i.e., urgency, frequency, dizziness) , Use of gait belt , Stay within arm's reach during toileting/showering (i.e., dizziness, orthostasis) , Educate patient on medication side effects, and Bed/chair alarm (i.e., change in mental status)   Patient Care Equipment: Needs assistance with patient care equipment when ambulating  Mobility: Assist x2, Gait belt in use when ambulating, Utilize walker, cane, or additional walking aid for ambulation, and Elimination equipment at bedside (urinal or commode)   Cognition: Bed/Chair Alarm   Risk for Moderate/Major Injury: N/A  2. BMAT: Bedside Mobility Assessment Tool (BMAT)  Sit and Shake: Pass  Extend and Point: Pass  Stand: Fail (Yellow: stand, transfer and/or walk assisted), stop assessment        Hygiene:     Bath/Shower: CHG wipes shower/bath     Oral Care: Moisturize, Brush     Urinary Catheter / Perineal Care: Self    Intake and Output:           Last Bowel Movement Date:  (pta), Stool Appearance: Formed, Loose         Date 08/22/23 0701 - 08/23/23 0700 08/23/23 0701 - 08/24/23 0700   Shift 0701-1900 1901-0700 24 Hour Total 0701-1900 1901-0700 24 Hour Total   INTAKE   P.O. [phone] 500  500   I.V.(mL/kg/hr) 9.5(0)  9.5(0)      Shift Total(mL/kg) 903.5(8.2) 236(2.1) 1139.5(10.3) 500(4.5)  500(4.5)   OUTPUT   Urine(mL/kg/hr) 900(0.7) 1125(0.8) 2025(0.8) 500  500     Urine 900 1125 2025 500  500   Drains 18 4 22         Drain Output (mL) (Surgical Drain Bulb (e.g. JP) Anterior;Right Neck) 18 4 22       Other           Stool Occurrence  0 x 0 x 1 x 1 x     Urine Occurrence  1 x 2 x 3 x 1 x  1 x   Shift Total(mL/kg) 918(8.3) 1129(10.2) 6962(95.2) 500(4.5)  500(4.5)   NET -14.5 -893 -907.5 0  0   Weight (kg) 110.3 110.3 110.3 110.3 110.3 110.3

## 2023-08-25 ENCOUNTER — Encounter: Admit: 2023-08-25 | Discharge: 2023-08-25 | Payer: MEDICARE

## 2023-08-25 NOTE — Progress Notes
BH43 END OF SHIFT/ JHFRAT NOTE    Admission Date: 08/21/2023  Length of Stay: LOS: 4 days    Acute events, interventions, provider communication: None    Patient Interventions and Education  Fall Risk/JHFRAT Interventions and Education: (Charting when applicable)   Total Fall Risk Score: Total Fall Risk Score: 13.   Elimination Interventions : N/A  Medications : Use of gait belt  and Educate patient on medication side effects  Patient Care Equipment: Needs assistance with patient care equipment when ambulating  Mobility: Assist x1, Gait belt in use when ambulating, and Utilize walker, cane, or additional walking aid for ambulation  Cognition: N/A  Risk for Moderate/Major Injury: Surgery/procedure requiring anesthesia within past 24 hours  2. BMAT: Bedside Mobility Assessment Tool (BMAT)  Sit and Shake: Pass  Extend and Point: Pass  Stand: Fail (Yellow: stand, transfer and/or walk assisted), stop assessment  Step: Fail (Yellow: stand, transfer and/or walk assisted), stop assessment    3. Restraints:  No     Restraint Goal: Patient will be free from injury while physically restrained.  See Docflowsheet for restraint documentation, interventions, education, etc.    Hygiene:     Bath/Shower: CHG wipes shower/bath     Oral Care: Brush, Moisturize     Urinary Catheter / Perineal Care: Self    Intake and Output:           Last Bowel Movement Date: 08/24/23, Stool Appearance: Unable to assess         Date 08/24/23 0701 - 08/25/23 0700 08/25/23 0701 - 08/26/23 0700   Shift 0701-1900 1901-0700 24 Hour Total 0701-1900 1901-0700 24 Hour Total   INTAKE   P.O. [phone] 240  240   Shift Total(mL/kg) 488(4.4) 800(7.3) 1288(11.7) 240(2.2)  240(2.2)   OUTPUT   Drains 1 0 1        Drain Output (mL) ([REMOVED] Surgical Drain Bulb (e.g. JP) Anterior;Right Neck) 1 0 1      Other           Stool Occurrence 2 x 0 x 2 x        Urine Occurrence  3 x 4 x 7 x      Shift Total(mL/kg) 1(0) 0(0) 1(0)      NET [phone] 240  240   Weight (kg) 110.3 110.3 110.3 110.3 110.3 110.3

## 2023-08-25 NOTE — Progress Notes
[facility] END OF SHIFT/ JHFRAT NOTE    Admission Date: 08/21/2023  Length of Stay: LOS: 3 days    Acute events, interventions, provider communication: No acute events    Patient Interventions and Education  Fall Risk/JHFRAT Interventions and Education: (Charting when applicable)   Total Fall Risk Score: Total Fall Risk Score: 13.   Elimination Interventions : Commode at bedside  Medications : Bedside commode (i.e., urgency, frequency, dizziness) , Use of gait belt , and Educate patient on medication side effects  Patient Care Equipment: N/A  Mobility: Assist x1, Gait belt in use when ambulating, Utilize walker, cane, or additional walking aid for ambulation, and Elimination equipment at bedside (urinal or commode)   Cognition: N/A  Risk for Moderate/Major Injury: Risk for fracture  2. BMAT: Bedside Mobility Assessment Tool (BMAT)  Sit and Shake: Pass  Extend and Point: Pass  Stand: Fail (Yellow: stand, transfer and/or walk assisted), stop assessment    3. Restraints:  No     Restraint Goal: Patient will be free from injury while physically restrained.  See Docflowsheet for restraint documentation, interventions, education, etc.    Hygiene:     Bath/Shower: CHG surgical site care, CHG wipes shower/bath     Oral Care: Brush     Urinary Catheter / Perineal Care: Self    Intake and Output:           Last Bowel Movement Date: 08/24/23, Stool Appearance: Unable to assess         Date 08/23/23 0701 - 08/24/23 0700 08/24/23 0701 - 08/25/23 0700   Shift 0701-1900 1901-0700 24 Hour Total 0701-1900 1901-0700 24 Hour Total   INTAKE   P.O. 500  500 488  488   Shift Total(mL/kg) 500(4.5)  500(4.5) 488(4.4)  488(4.4)   OUTPUT   Urine(mL/kg/hr) 500(0.4) 300(0.2) 800(0.3)        Urine 500 300 800      Drains  6 6 1  1      Drain Output (mL) (Surgical Drain Bulb (e.g. JP) Anterior;Right Neck)  6 6 1  1    Other           Stool Occurrence 1 x 1 x 2 x 2 x  2 x     Urine Occurrence  1 x 2 x 3 x 3 x  3 x   Shift Total(mL/kg) 500(4.5) 306(2.8) 806(7.3) 1(0)  1(0)   NET 0 -306 -306 487  487   Weight (kg) 110.3 110.3 110.3 110.3 110.3 110.3

## 2023-08-25 NOTE — Progress Notes
PHYSICAL THERAPY  PROGRESS NOTE      Name: Kristen Vaughn   MRN: [id number]     DOB: [birth year]      Age: 62 y.o.  Admission Date: 08/21/2023     LOS: 4 days     Date of Service: 08/25/2023    Mobility  Progressive Mobility Level: Walk laps  Distance Walked (feet): 500 ft  Level of Assistance: Assist X1  Assistive Device: Walker  Activity Limited By: Pain    Subjective  Significant Hospital Events: 62 y.o. 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 11/27  Mental / Cognitive: Alert;Cooperative;Follows commands  Pain: Complains of pain;Does not rate pain;Demonstrates non-verbal signs of pain  Pain level: Before activity;During activity;After activity  Pain Location: Post-surgical;Back  Pain Description: Aching  Pain Interventions: Patient agrees to participate in therapy with current pain level;Patient assisted into position of comfort    Home Living Situation  Lives With: Alone  Type of Home: Apartment  Entry Stairs: No stairs;Elevator  In-Home Stairs: Able to live on main level  Patient Owned Equipment: Walker with wheels;Bathing: Shower chair;Cane: Single point  Comments: Pt reports using walker at baseline. Has a caregiver during the day who assists with meal prep and med management. Pt requesting night time caregiver.    Prior Level of Function  Level Of Independence: Independent with ADL and community mobility without device;Assist needed for IADL  Required Assist For: Bathing;Meal preparation;Medication/finance management;Transportation/driving  History of Falls in Past 3 Months: Yes    Precautions  Precautions: Cervical collar on at all times;Back Safety    Bed Mobility/Transfer  Bed Mobility: Supine to Sit: Standby Assist  Bed Mobility: Sit to Supine: Standby Assist  Transfer Type: Sit to Stand  Transfer: Assistance Level: To/From;Bed;Standby Assist  Transfer: Assistive Device: Nurse, adult  Transfers: Type Of Assistance: Materials engineer;For Balance;For Strength Deficit;For Safety Considerations  End Of Activity Status: In Bed;Nursing Notified;Instructed Patient to Request Assist with Mobility;Instructed Patient to Use Call Light    Balance  Sitting Balance: Static Sitting Balance;Dynamic Sitting Balance;Standby Assist  Standing Balance: Static Standing Balance;Dynamic Standing Balance;2 UE support;Minimal Assist    Gait  Gait Distance: 500 feet  Gait: Assistance Level: Minimal Assist  Gait: Assistive Device: Roller Walker  Gait: Descriptors: Antalgic;Pace: Slow;Decreased knee extension in stance phase RLE;Decreased knee extension in stance phase LLE;Decreased step length;Loss of balance;Scissoring  Comments: Patient with improved gait pattern this date; less scissoring and improved balance noted. Requires contact guard assist for balance and safety.  Activity Limited By: Weakness    Education  Persons Educated: Patient  Patient Barriers To Learning: None Noted  Teaching Methods: Verbal Instruction  Patient Response: Verbalized Understanding  Topics: Plan/Goals of PT Interventions;Mobility Progression;Safety Awareness;Up with Assist Only;Importance of Increasing Activity;Ambulate With Nursing;Recommend Continued Therapy    Assessment/Progress  Impaired Mobility Due To: Decreased Strength;Impaired Balance;Safety Concerns;Decreased Activity Tolerance;Medical Status Limitation  Assessment/Progress: Should Improve w/ Continued PT  Comments: Pt with good tolerance of PT on this date.  Pt still reporting pain, but pt motivated to participate in therapy.  Pt typically ambulates independently with rollator, but needs Ax1 on this date for safe mobility and ambulation.  Pt will benefit from continued PT to address mobility deficits.    AM-PAC 6 Clicks Basic Mobility Inpatient  Turning from your back to your side while in a flat bed without using bed rails: A Little  Moving from lying on your back to sitting on the side of a flat bed without using bedrails :  A Little  Moving to and from a bed to a chair (including a wheelchair): A Little  Standing up from a chair using your arms (e.g. wheelchair, or bedside chair): A Little  To walk in hospital room: A Little  Climbing 3-5 steps with a railing: Total  Basic Mobility Inpatient Raw Score: 16  Standardized (T-scale) Score: 38.32    Goals  Goal Formulation: With Patient  Time For Goal Achievement: 4 days  Patient Will Go Supine To/From Sit: w/ Stand By Assist, Met  Patient Will Transfer Sit to Stand: w/ Stand By Assist, Ongoing  Patient Will Ambulate: Greater than 200 Feet, w/ Walker, w/ Stand By Assist, Ongoing    Plan  Treatment Interventions: Mobility training;Strengthening;Balance activities;Endurance training  Plan Frequency: 5 Days per Week  PT Plan for Next Visit: Progress independence with bed mobility and transfers, increase gait distance, trial stairs (patient doesn't have stairs to enter apartment, however frequently has to particiapte in fire drills that require navigation of stairs)    PT Discharge Recommendations  Recommendation: Inpatient setting  Patient Currently Requires Physical Assist With: All mobility    Therapist: Delia Chimes, PT  Date: 08/25/2023

## 2023-08-25 NOTE — Progress Notes
[facility] END OF SHIFT/ JHFRAT NOTE    Admission Date: 08/21/2023  Length of Stay: LOS: 4 days    Acute events, interventions, provider communication: Pain meds given around the clock.     Patient Interventions and Education  Fall Risk/JHFRAT Interventions and Education: (Charting when applicable)   Total Fall Risk Score: Total Fall Risk Score: 11.   Elimination Interventions : N/A  Medications : Stay within arm's reach during toileting/showering (i.e., dizziness, orthostasis)  and Educate patient on medication side effects  Patient Care Equipment: Needs assistance with patient care equipment when ambulating and Ensure environment is free of clutter and walkways are clear from tripping hazards  Mobility: Assist x1 and Utilize walker, cane, or additional walking aid for ambulation  Cognition: Stay within arm's reach while patient ambulating/toileting/showering and Increase frequency of purposeful rounding  Risk for Moderate/Major Injury: Risk for fracture  2. BMAT: Bedside Mobility Assessment Tool (BMAT)  Sit and Shake: Pass  Extend and Point: Pass  Stand: Pass  Step: Fail (Yellow: stand, transfer and/or walk assisted), stop assessment    3. Restraints:  No     Restraint Goal: Patient will be free from injury while physically restrained.  See Docflowsheet for restraint documentation, interventions, education, etc.    Hygiene:     Bath/Shower: CHG surgical site care, CHG wipes shower/bath     Oral Care: Brush     Urinary Catheter / Perineal Care: Self    Intake and Output:           Last Bowel Movement Date: 08/24/23, Stool Appearance: Unable to assess         Date 08/24/23 0701 - 08/25/23 0700 08/25/23 0701 - 08/26/23 0700   Shift 0701-1900 1901-0700 24 Hour Total 0701-1900 1901-0700 24 Hour Total   INTAKE   P.O. [phone]      Shift Total(mL/kg) 488(4.4) 800(7.3) 1288(11.7)      OUTPUT   Drains 1 0 1        Drain Output (mL) (Surgical Drain Bulb (e.g. JP) Anterior;Right Neck) 1 0 1      Other           Stool Occurrence 2 x 0 x 2 x        Urine Occurrence  3 x 3 x 6 x      Shift Total(mL/kg) 1(0) 0(0) 1(0)      NET [phone]      Weight (kg) 110.3 110.3 110.3 110.3 110.3 110.3

## 2023-08-26 ENCOUNTER — Encounter: Admit: 2023-08-26 | Discharge: 2023-08-26 | Payer: MEDICARE

## 2023-08-26 DIAGNOSIS — M797 Fibromyalgia: Secondary | ICD-10-CM

## 2023-08-26 DIAGNOSIS — Z9049 Acquired absence of other specified parts of digestive tract: Secondary | ICD-10-CM

## 2023-08-26 DIAGNOSIS — Z823 Family history of stroke: Secondary | ICD-10-CM

## 2023-08-26 DIAGNOSIS — Z8781 Personal history of (healed) traumatic fracture: Secondary | ICD-10-CM

## 2023-08-26 DIAGNOSIS — J439 Emphysema, unspecified: Secondary | ICD-10-CM

## 2023-08-26 DIAGNOSIS — F17290 Nicotine dependence, other tobacco product, uncomplicated: Secondary | ICD-10-CM

## 2023-08-26 DIAGNOSIS — Z791 Long term (current) use of non-steroidal anti-inflammatories (NSAID): Secondary | ICD-10-CM

## 2023-08-26 DIAGNOSIS — Z8249 Family history of ischemic heart disease and other diseases of the circulatory system: Secondary | ICD-10-CM

## 2023-08-26 DIAGNOSIS — Z90710 Acquired absence of both cervix and uterus: Secondary | ICD-10-CM

## 2023-08-26 DIAGNOSIS — Z79620 Long term (current) use of immunosuppressive biologic: Secondary | ICD-10-CM

## 2023-08-26 DIAGNOSIS — Z8261 Family history of arthritis: Secondary | ICD-10-CM

## 2023-08-26 DIAGNOSIS — I10 Essential (primary) hypertension: Secondary | ICD-10-CM

## 2023-08-26 DIAGNOSIS — Z9181 History of falling: Secondary | ICD-10-CM

## 2023-08-26 DIAGNOSIS — E785 Hyperlipidemia, unspecified: Secondary | ICD-10-CM

## 2023-08-26 DIAGNOSIS — K219 Gastro-esophageal reflux disease without esophagitis: Secondary | ICD-10-CM

## 2023-08-26 DIAGNOSIS — Z91018 Allergy to other foods: Secondary | ICD-10-CM

## 2023-08-26 DIAGNOSIS — M810 Age-related osteoporosis without current pathological fracture: Secondary | ICD-10-CM

## 2023-08-26 DIAGNOSIS — Z825 Family history of asthma and other chronic lower respiratory diseases: Secondary | ICD-10-CM

## 2023-08-26 DIAGNOSIS — Z888 Allergy status to other drugs, medicaments and biological substances status: Secondary | ICD-10-CM

## 2023-08-26 DIAGNOSIS — Z79899 Other long term (current) drug therapy: Secondary | ICD-10-CM

## 2023-08-26 DIAGNOSIS — Z9851 Tubal ligation status: Secondary | ICD-10-CM

## 2023-08-26 DIAGNOSIS — Z7951 Long term (current) use of inhaled steroids: Secondary | ICD-10-CM

## 2023-08-26 DIAGNOSIS — M960 Pseudarthrosis after fusion or arthrodesis: Secondary | ICD-10-CM

## 2023-08-26 DIAGNOSIS — Z860100 Personal history of colon polyps, unspecified: Secondary | ICD-10-CM

## 2023-08-26 DIAGNOSIS — Z9889 Other specified postprocedural states: Secondary | ICD-10-CM

## 2023-08-26 DIAGNOSIS — Z8673 Personal history of transient ischemic attack (TIA), and cerebral infarction without residual deficits: Secondary | ICD-10-CM

## 2023-08-26 DIAGNOSIS — F419 Anxiety disorder, unspecified: Secondary | ICD-10-CM

## 2023-08-26 DIAGNOSIS — G4733 Obstructive sleep apnea (adult) (pediatric): Secondary | ICD-10-CM

## 2023-08-26 DIAGNOSIS — Z86711 Personal history of pulmonary embolism: Secondary | ICD-10-CM

## 2023-08-26 DIAGNOSIS — Z8701 Personal history of pneumonia (recurrent): Secondary | ICD-10-CM

## 2023-08-26 DIAGNOSIS — Z811 Family history of alcohol abuse and dependence: Secondary | ICD-10-CM

## 2023-08-26 DIAGNOSIS — M2578 Osteophyte, vertebrae: Secondary | ICD-10-CM

## 2023-08-26 DIAGNOSIS — Z881 Allergy status to other antibiotic agents status: Secondary | ICD-10-CM

## 2023-08-26 DIAGNOSIS — Z833 Family history of diabetes mellitus: Secondary | ICD-10-CM

## 2023-08-26 DIAGNOSIS — Z809 Family history of malignant neoplasm, unspecified: Secondary | ICD-10-CM

## 2023-08-26 DIAGNOSIS — Z6838 Body mass index (BMI) 38.0-38.9, adult: Secondary | ICD-10-CM

## 2023-08-26 DIAGNOSIS — Z8614 Personal history of Methicillin resistant Staphylococcus aureus infection: Secondary | ICD-10-CM

## 2023-08-26 DIAGNOSIS — Z7409 Other reduced mobility: Secondary | ICD-10-CM

## 2023-08-26 DIAGNOSIS — Z8262 Family history of osteoporosis: Secondary | ICD-10-CM

## 2023-08-26 DIAGNOSIS — E66812 Obesity, class 2: Secondary | ICD-10-CM

## 2023-08-26 DIAGNOSIS — F339 Major depressive disorder, recurrent, unspecified: Secondary | ICD-10-CM

## 2023-08-26 DIAGNOSIS — Z8269 Family history of other diseases of the musculoskeletal system and connective tissue: Secondary | ICD-10-CM

## 2023-08-26 LAB — CULTURE-WOUND/TISSUE/FLUID(AEROBIC ONLY)W/SENSITIVITY

## 2023-08-26 LAB — CULTURE-ANAEROBIC

## 2023-08-26 MED ORDER — DOCUSATE SODIUM 100 MG PO CAP
100 mg | ORAL_CAPSULE | Freq: Two times a day (BID) | ORAL | 0 refills | Status: AC
Start: 2023-08-26 — End: ?
  Filled 2023-08-26: qty 180, 90d supply, fill #1

## 2023-08-26 MED ORDER — HYDROCODONE-ACETAMINOPHEN 5-325 MG PO TAB
1-2 | ORAL_TABLET | ORAL | 0 refills | 30.00000 days | Status: AC | PRN
Start: 2023-08-26 — End: ?
  Filled 2023-08-26: qty 42, 7d supply, fill #1

## 2023-08-26 NOTE — Progress Notes
PHYSICAL THERAPY  PROGRESS NOTE      Name: Kristen Vaughn   MRN: [id number]     DOB: 1960-11-30      Age: 62 y.o.  Admission Date: 08/21/2023     LOS: 5 days     Date of Service: 08/26/2023    Mobility  Progressive Mobility Level: Walk in hallway  Distance Walked (feet): 200 ft  Level of Assistance: Stand by assistance  Assistive Device: Walker  Activity Limited By: Balance deficits    Subjective  Significant Hospital Events: 62 y.o. 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 Has a caregiver during the day who assists with meal prep and med management. Pt requesting night time caregiver.    Prior Level of Function  Level Of Independence: Independent with ADL and community mobility without device;Assist needed for IADL  Required Assist For: Bathing;Meal preparation;Medication/finance management;Transportation/driving  History of Falls in Past 3 Months: Yes    Precautions  Precautions: Cervical collar on at all times;Back Safety    Bed Mobility/Transfer  Bed Mobility: Supine to Sit: Standby Assist  Bed Mobility: Sit to Supine: Standby Assist  Transfer Type: Sit to Stand  Transfer: Assistance Level: To/From;Bed;Standby Assist  Transfer: Assistive Device: Nurse, adult  Transfers: Type Of Assistance: Materials engineer;For Balance;For Strength Deficit;For Safety Considerations  End Of Activity Status: In Bed;Nursing Notified;Instructed Patient to Request Assist with Mobility;Instructed Patient to Use Call Light    Balance  Sitting Balance: Static Sitting Balance;Dynamic Sitting Balance;Standby Assist  Standing Balance: Static Standing Balance;Dynamic Standing Balance;2 UE support;Standby Assist    Gait  Gait Distance: 200 feet  Gait: Assistance Level: Standby Assist  Gait: Assistive Device: Nurse, adult  Gait: Descriptors: Decreased step length;Loss of balance;Scissoring  Comments: Patient ambulated around unit with close SBA for safety. Patient required 2 instances for CGA during turns due to LOB noted. Patient with scissoring feet throughout gait and more notable on turns.  Activity Limited By: Weakness    Education  Persons Educated: Patient  Patient Barriers To Learning: None Noted  Teaching Methods: Verbal Instruction  Patient Response: Verbalized Understanding  Topics: Plan/Goals of PT Interventions;Mobility Progression;Safety Awareness;Up with Assist Only;Importance of Increasing Activity;Ambulate With Nursing;Recommend Continued Therapy    Assessment/Progress  Impaired Mobility Due To: Decreased Strength;Impaired Balance;Safety Concerns;Decreased Activity Tolerance;Medical Status Limitation  Assessment/Progress: Should Improve w/ Continued PT    AM-PAC 6 Clicks Basic Mobility Inpatient  Turning from your back to your side while in a flat bed without using bed rails: A Little  Moving from lying on your back to sitting on the side of a flat bed without using bedrails : A Little  Moving to and from a bed to a chair (including a wheelchair): A Little  Standing up from a chair using your arms (e.g. wheelchair, or bedside chair): A Little  To walk in hospital room: A Little  Climbing 3-5 steps with a railing: A Little  Basic Mobility Inpatient Raw Score: 18  Standardized (T-scale) Score: 41.05    Goals  Goal Formulation: With Patient  Time For Goal Achievement: 4 days  Patient Will Go Supine To/From  Sit: w/ Stand By Assist, Met  Patient Will Transfer Sit to Stand: w/ Stand By Assist, Ongoing  Patient Will Ambulate: Greater than 200 Feet, w/ Walker, w/ Stand By Assist, Ongoing    Plan  Treatment Interventions: Mobility training;Strengthening;Balance activities;Endurance training  Plan Frequency: 5 Days per Week  PT Plan for Next Visit: Progress independence with bed mobility and transfers, increase gait distance, trial stairs (patient doesn't have stairs to enter apartment, however frequently has to particiapte in fire drills that require navigation of stairs)    PT Discharge Recommendations  Recommendation: Inpatient setting  Recommendation for Therapy Post Discharge: Home health PT  Patient Currently Requires Physical Assist With: Ambulation  Patient Currently Requires Equipment: Walker with wheels - Patient requires the use of a walker with wheels to complete ADLs in the home including meal preparation, ambulation to the bathroom for toileting, bathing and grooming, and safe home mobility. Patient is unable to complete these ADLs with a cane or crutch and can safely use the walker.    Anticipate patient would benefit from short stay at inpatient setting due to balance deficits, however patient requests to go home. Patient woud benefit from home health OT/PT and roller walker if she decides to discharge home.     Therapist: Delia Chimes, PT  Date: 08/26/2023

## 2023-08-26 NOTE — Case Management (ED)
Case Management Progress Note    NAME:Kristen Vaughn                          MRN: [id number]              DOB:[birth year]          AGE: 62 y.o.  ADMISSION DATE: 08/21/2023             DAYS ADMITTED: LOS: 5 days      Today's Date: 08/26/2023    PLAN: DC home with [facility] services    Expected Discharge Date: 08/26/2023   Is Patient Medically Stable: Yes   Are there Barriers to Discharge? yes (insurance)    INTERVENTION/DISPOSITION:  Discharge Planning    Discussed pt in huddle   Pt stable for dc at this time   PT/OT recs for placement, pt currently walking standby assist   SW noted pts managed Medicare plan, as well as pts desire to dc home   NCM confirmed she had spoken with pt and pt is declining placement at this time  SW will continue to follow and be available with dc planning as needed                         Transportation              Does the Patient Need Case Management to Arrange Discharge Transport? (ex: facility, ambulance, wheelchair/stretcher, Medicaid, cab, other): No  Will the Patient Use Family Transport?: Yes  Transportation Name, Phone and Availability #1[name] (caregiver) [phone]  Support                 Info or Referral                 Positive SDOH Domains and Potential Barriers                   Medication Needs                                                 Financial                 Legal                 Other                 Discharge Disposition                                                                                                                                                      Selected Continued Care - Admitted Since 08/21/2023  No services have been selected for the patient.       Lind Covert, LMSW  Work Phone: [phone]  Amie Critchley

## 2023-08-26 NOTE — Progress Notes
OCCUPATIONAL THERAPY  PROGRESS NOTE    Name: Kristen Vaughn   MRN: [id number]     DOB: Oct 10, 1960      Age: 62 y.o.  Admission Date: 08/21/2023     LOS: 5 days     Date of Service: 08/26/2023    Mobility  Patient Turn/Position: Supine  Progressive Mobility Level: Walk in hallway  Distance Walked (feet): 200 ft  Level of Assistance: Stand by assistance  Assistive Device: Walker  Activity Limited By:  (balance deficits)    Subjective  Significant Hospital Events: 62 y.o. 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 Has a caregiver during the day who assists with meal prep and med management. Pt requesting night time caregiver.    Prior Level of Function  Level Of Independence: Independent with ADL and community mobility without device;Assist needed for IADL  Required Assist For: Bathing;Meal preparation;Medication/finance management;Transportation/driving  History of Falls in Past 3 Months: Yes    Precautions  Precautions: Cervical collar on at all times;Back Safety    ADL's  Where Assessed: Edge of Bed;Standing at [employer] Assist: Stand By Assist  Grooming Deficits: Supervision/Safety;Teeth Care  LE Dressing Assist: Stand By Assist  LE Dressing Deficits: Supervision/Safety;Don/Doff R Sock;Don/Doff L Sock;Increased Time To Complete    ADL Mobility  Bed Mobility: Supine to Sit: Modified independent  Bed Mobility: Sit to Supine: Modified independent  Bed Mobility Comments: HOB flat; no rail used; log roll technique  Transfer Type: Sit to/from stand  Transfer: Assistance Level: To/from;Bed;Standby assist  Transfer: Assistive Device: Agricultural consultant: Type of Assistance: For safety considerations  End of Activity Status: In bed;Instructed patient to request assist with mobility;Instructed patient to use call light;Nursing notified  Sitting Balance: Static sitting balance;Dynamic sitting balance;Independent  Standing Balance: Static standing balance;1 UE support;Standby assist  Gait Distance: 200 feet  Gait: Assistance Level: Standby assist  Gait: Assistive Device: Roller walker  Gait Comments: Patient ambulated around unit with close SBA for safety. Patient required 2 instances for CGA during turns due to LOB noted. Patient with scissoring feet throughout gait and more notable on turns.    Cognition  Overall Cognitive Status: WFL to Adequately Complete Self Care Tasks Safely    Education  Goal Formulation: With Patient    Assessment  Assessment: Decreased ADL Status;Decreased UE ROM;Decreased UE Strength;Decreased Fine Motor Coordination;Decreased Self-Care Trans;Decreased High-Level ADLs  Prognosis: Good;w/Cont OT s/p Acute Discharge  Goal Formulation: Patient    AM-PAC 6 Clicks Daily Activity Inpatient  Putting on and taking off regular lower body clothes: None  Bathing (Including washing, rinsing, drying): A Little  Toileting, which includes using toilet, bedpan, or urinal: None  Putting on and taking off regular upper body clothing: None  Taking care of personal grooming such as brushing teeth: None  Eating meals: None  Daily Activity Raw Score: 23  Standardized (T-scale) Score: 51.12    Plan  OT Frequency: 5x/week  OT Plan for Next Visit: LB dress w/ pants; toileting w/ clothing management    ADL Goals  Patient Will Perform All ADL's: w/ Stand By Assist  Functional Transfer Goals  Pt Will Perform All Functional Transfers: w/ Stand By Assist     OT Discharge Recommendations  Recommendation: Inpatient setting  Recommendation for Therapy Post Discharge: Home health  Patient Currently Requires Physical Assist With: Bathing;Toileting;All home functioning ADLs  Patient Currently Requires Equipment: Walker with wheels  Comments: Anticipate patient would benefit from short stay at inpatient setting due to balance deficits, however patient requests to go home. Patient woud benefit from home health OT/PT and roller walker if she decides to discharge home.    Patient requires the use of a walker with wheels to complete ADLs in the home including meal preparation, ambulation to the bathroom for toileting, bathing and grooming, and safe home mobility.  Patient is unable to complete these ADLs with a cane or crutch and can safely use the walker.    Therapist: Honor Loh, OTR/L [id number]  Date: 08/26/2023

## 2023-08-26 NOTE — Progress Notes
Patient/family declined:  Bed/chair alarm, W/in arms reach during toileting/showering, and Ambulation.    Patient/family educated on importance of intervention to their safety/quality of care. Patient/family continues to decline care.    Reason Why Patient/Family declined:  Ambulates without staff and assistive devices.    Individualized safety and/or care plan implemented. If additional safety measures implemented, please list them.     Escalated to:  Unit coordinator/charge nurse.

## 2023-08-26 NOTE — Case Management (ED)
 DME Delivery Request    Delivered [employer] roller walker to pt's bedside per the request of Carroll Sage, RNCM.    Linard Millers  Case Management Assistant

## 2023-08-26 NOTE — Case Management (ED)
Case Management Admission Assessment    NAME:Kristen Vaughn                          MRN: [id number]             DOB:04-02-1961          AGE: 62 y.o.  ADMISSION DATE: 08/21/2023             DAYS ADMITTED: LOS: 5 days      Today?s Date: 08/26/2023    Source of Information: Patient and EMR        Plan  Plan: Case Management Assessment, Assist PRN with SW/NCM Services, Discharge Planning for Home with Post-Acute Care Needs    NCM spoke with patient to complete initial assessment, introduced self and explained CM role in DC planning and verified demographics. Provided opportunity for questions and discussion. Patient and family encouraged to contact Case Management team with questions and concerns during hospitalization.   Patient POD #5, Revision C5-7 Corpectomy + posterior fusion C5-7.   Patient lives alone in a single level apartment in [place], [place]. Patient shares she has a caregiver through [employer] 20hrs/week, 4 hours/day. Patient shares her insurance is attempting to add 2 hours/night.   PT/OT consulted -- therapy recommending a roller walker upon discharge. NCM spoke with patient regarding recommendation and DME companies, [employer] selected. NCM tasked CMA to deliver roller walker to patient's bedside. PT recommending home health. NCM provided education to patient/family on available choices, quality data and offered opportunity for questions.  The following choice list(s) provided to patient/family: In-network list AND CM list with Quality Data.  Patient/family requested the following referrals be sent: [facility]. NCM faxed referral, awaiting review.   Patient's caregiver, Charlesetta Garibaldi to provide transportation home today.   NCM will continue to follow for discharge planning.      Patient Address/Phone  [address]  [place] [address]  [phone] (home)     Emergency Contact  Extended Emergency Contact Information  Primary Emergency Contact: [facility] Phone: [phone]  Mobile Phone: [phone]  Relation: Sister  Interpreter needed? No  Secondary Emergency Contact: Thurnell Garbe  Mobile Phone: [phone]  Relation: Friend    Editor, commissioning  Healthcare Directive: No, patient does not have a healthcare directive  Would patient like to fill out a (a new) Healthcare Directive?: No, patient declined  Psych Advance Directive (Psych unit only): No, patient does not have a Social research officer, government  Does the Patient Need Case Management to Arrange Discharge Transport? (ex: facility, ambulance, wheelchair/stretcher, Medicaid, cab, other): No  Will the Patient Use Family Transport?: Yes  Transportation Name, Phone and Availability #1[name] (caregiver) [phone]    Expected Discharge Date  08/26/2023     Living Situation Prior to Admission  Living Arrangements  Type of Residence: Home, independent  Living Arrangements: Alone  Financial risk analyst / Tub: Psychologist, counselling  How many levels in the residence?: 1  Can patient live on one level if needed?: Yes  Does residence have entry and/or inside stairs?: No  Assistance needed prior to admit or anticipated on discharge: Yes  Who provides assistance or could if needed?: Ayn (caregiver)  Are they in good health?: Yes  Can support system provide 24/7 care if needed?: No  Level of Function   Prior level of function: Independent  Cognitive Abilities   Cognitive Abilities: Alert and Oriented, Engages in  problem solving and planning, Participates in Radio producer Resources  Coverage  Primary Insurance: Medicare Replacement Administrator Medicare)  Secondary Insurance: Medicaid Administrator Medicaid)  Medication Coverage    Medication Coverage: Medicare Part D  Are current medications affordable?: Yes  Source of Income   Source Of Income: SSDI  Financial Assistance Needed?  No    Psychosocial Needs  Mental Health  Mental Health History: Yes  Mental Health Symptoms: Feeling depressed  Substance Use History  Substance Use History Screen: In the past  Comment: former tobacco use  Other  No    Current/Previous Services  PCP  [place], [place], [phone], [phone]  Pharmacy    [facility] [place], [place] - [address]  [address]  [place] [address]  Phone: [phone] Fax: [phone]    [id number] [place], [place] - [address]  [address]  [place] [address]  Phone: [phone] Fax: [phone]    [facility]  [address]  [place] [address]  Phone: [phone] Fax: [phone]    Durable Medical Equipment   Durable Medical Equipment at home: Grab bars, Rollator, Single [employer]  Home Health  Receiving home health: No  Hemodialysis or Peritoneal Dialysis  Undergoing hemodialysis or peritoneal dialysis: No  Tube/Enteral Feeds  Receive tube/enteral feeds: No  Infusion  Receive infusions: No  Private Duty  Private duty help used: No  Home and Community Based Services  Home and community based services: Yes  HCBS assistance hours/week: 20 hours/ week  Provider Schedule: 4 hours a day  [employer]: N/A  Hospice  Hospice: No  Outpatient Therapy  PT: In the past  OT: No  SLP: No  Skilled Nursing Facility/Nursing Home  SNF: No  NH: No  Inpatient Rehab  IPR: No  Long-Term Acute Care Hospital  LTACH: No  Acute Hospital Stay  Acute Hospital Stay: In the past  Was patient's stay within the last 30 days?: No      Carroll Sage, BSN, RN  Surgery-Ortho Nurse Case Manager  Available on [employer]

## 2023-08-26 NOTE — Progress Notes
Orthopedic Spine Progress Note      A: Kristen Vaughn is a 62 y.o. 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 11/27    P:  -Diet: CLD ADAT  -Acute blood loss anemia: Monitor CBC's daily  -Continue current pain regimen  -PT/OT: Mobilize ad lib  - patient should mobilize with PT   -Lytes replaced PRN  -Bowel regimen  - drain removed 12/1    VTE: Mechanical only.     DISPO: Continue inpatient, continue drain    Azzie Roup, MD      ----------------------------------------------------------------------------------------------------------------------------------  ----------------------------------------------------------------------------------------------------------------------------------    S: No acute events. Patient overall doing well today. She is questioning if IPR is necessary and has a strong desire to DC home. Denies leg pain. Tolerating diet (Advance Diet as Tolerated  DIET REGULAR). +flatus, -BM    O:   Blood pressure 120/70, pulse 80, temperature 36.3 ?C (97.4 ?F), height 170.2 cm (5' 7), weight 110.3 kg (243 lb 2.7 oz), SpO2 99%.   Body mass index is 38.09 kg/m?Marland Kitchen    Exam:   GEN: Alert. Mildly tearful   CV: Normal rate, regular rhythm   PULM: Non-labored   ABD: Soft, non-distended  EXTREM:     MOTOR:  Upper Ext. Deltoid Triceps Biceps Wrist Ext Wrist Flex Grip Interossei   Right 5 5 5 5 5 5 5    Left 5 5 5 5 5 5 5        SENSATION:  Upper extremity: Sensation intact to light touch and pin prick C5-T1 distributions        Dressings: Dressings clean, dry, and intact.     Pressure Injury: No pressure injury       Microbiology - Resulted Micro Last 72 Hrs        CULTURE-WOUND/TISSUE/FLUID(AEROBIC ONLY)W/SENSITIVITY  Resulted: 08/26/23 0517, Result status: Final result     Ordering provider: Criss Rosales, MD  08/21/23 0857 Resulting lab: [facility]   CLIA number: [id number]      Specimen Information      Source Collected On   Spine 08/21/23 0855              Components Component Value Flag   Routine Culture No growth at 5 days  --                  CULTURE-ANAEROBIC  Resulted: 08/25/23 0958, Result status: Preliminary result     Ordering provider: Criss Rosales, MD  08/21/23 0857 Resulting lab: [facility]   CLIA number: [id number]      Specimen Information      Source Collected On   Spine 08/21/23 0855              Components      Component Value Flag   Anaerobe Culture No anaerobes to date  --                    Output by Drain (mL) 08/20/23 0701 - 08/20/23 1900 08/20/23 1901 - 08/21/23 0700 08/21/23 0701 - 08/21/23 1900 08/21/23 1901 - 08/22/23 0700 08/22/23 0701 - 08/22/23 0813   Surgical Drain Bulb (e.g. JP) Anterior;Right Neck   50 23 10       Complete Blood Counts   No results for input(s): HGB, HCT, WBC, PLTCT in the last 72 hours.       Chemistry Panel   No results for input(s): NA, K, CL, CO2, BUN,  CR, GLU, MG, CA, PO4 in the last 72 hours.       Coagulation Studies   No results for input(s): PTT, INR in the last 72 hours.     Renal Function: No abnormalities  Electrolyte Abnormalities:    Potassium: Normal   Sodium: Normal    Malnutrition Details:                                                   Body mass index is 38.09 kg/m?Marland Kitchen Acceptable (19 to <25)    Medical issues addressed this hospital stay:  Patient Active Problem List    Diagnosis Date Noted    Cervical spinal stenosis 08/21/2023    Degenerative disc disease, cervical 05/01/2023    Pseudoarthrosis of cervical spine (HCC) 05/01/2023    OSA (obstructive sleep apnea) 03/04/2023    Mild persistent asthma without complication 10/26/2022    Macromastia 09/26/2022    Restless legs syndrome (RLS) 07/13/2022    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    Snoring 04/30/2022    Seasonal allergies 04/30/2022    Peripheral polyneuropathy 05/17/2021    Class 2 obesity in adult 05/26/2019    Family history of coronary artery disease 05/26/2019    Ambulates with cane 05/26/2019    Atypical chest pain 05/26/2019    S/P cervical spinal fusion 02/04/2019    Cervical stenosis of spine 10/28/2018    Tobacco abuse 10/24/2018    Preoperative cardiovascular examination 10/24/2018    Osteoporosis without current pathological fracture 10/15/2017    Cervical disc herniation 04/17/2017    Stenosis of cervical spine 04/17/2017    Cervical myelopathy (HCC) 04/17/2017    Cervical radiculopathy 04/17/2017    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

## 2023-08-27 ENCOUNTER — Encounter: Admit: 2023-08-27 | Discharge: 2023-08-27 | Payer: MEDICARE

## 2023-08-27 DIAGNOSIS — Z981 Arthrodesis status: Secondary | ICD-10-CM

## 2023-08-27 NOTE — Telephone Encounter
Patient's caregiver Ayn LVM. She is asking how the patient should shower if she cannot get the collar wet. She said they were supposed to discharge her home with a shower collar and did not. RN ordered shower collar and sent order to [facility] in [place], [place] per her request. Patient will take her first shower tomorrow. RN told her she should keep the original dressing on when showering and then take it off after the shower. RN informed her she should then cover the dressing with gauze and tape. She v/u.

## 2023-08-28 ENCOUNTER — Encounter: Admit: 2023-08-28 | Discharge: 2023-08-28 | Payer: MEDICARE

## 2023-08-28 NOTE — Discharge Instructions - Pharmacy
Physician Discharge Summary      Name: Kristen Vaughn  Medical Record Number: [id number]        Account Number:  [id number]  Date Of Birth:  1961/08/11                         Age:  61 years   Admit date:  08/21/2023                     Discharge date: 08/26/2023 12:25 PM    Attending Physician: Dr. Criss Rosales, MD               Service: [facility]- Spine    Physician Summary completed by: Celene Skeen, APRN-NP    Reason for hospitalization:   Patient Active Problem List    Diagnosis Date Noted    Cervical spinal stenosis 08/21/2023    Degenerative disc disease, cervical 05/01/2023    Pseudoarthrosis of cervical spine (HCC) 05/01/2023    OSA (obstructive sleep apnea) 03/04/2023     Overview Note:     HST 08/26/2022  BMI 38 243lb  AHI (4%) 12.9 supine 22.5  O2 low 72.7%, TIme <88% 306 minutes    Pt set up with AutoPAP @ 5-15cm H2O on 12/06/22.  DME: [facility] / [facility]  Insurance: [employer]  If the patient has to meet compliance, their compliance window will be from 01/06/23 to 03/06/23.     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          Admission Physical Exam:     Vitals:     07/31/23 1438   BP: 100/52   Pulse: 64   SpO2: 99%   PainSc: Six   Weight: 108.9 kg (240 lb)   Height: 170.2 cm (5' 7)      Oswestry Total Score:: 72  Pain Score: Six  Body mass index is 37.59 kg/m?Marland Kitchen     Constitutional: Alert, NAD  Head: Atraumatic  Eyes: EOMI  Respiratory: Unlabored breathing  Cardiovascular: Regular rate  Skin: No rashes or open wounds appreciated on back  Musculoskeletal: Maintaining strength.  Neurologic: Sensation stable.  Negative Hoffman's bilaterally today.  Decreased sensation throughout her left hand.    Admission Radiology/Lab:   No results found for this visit on 08/21/23 (from the past 24 hours).    Significant PMH:   Past Medical History:   Diagnosis Date    Allergy     Anxiety and depression     Arthritis     Asthma     Brain bleed [facility]) 2023    [facility], [place] well    Breast disorder     Carpal tunnel syndrome     Cervical myelopathy (HCC)     Cervical stenosis of spine     Chest pain     Constipation     Degenerative disc disease, cervical     Dizziness 2023    Essential hypertension     Fibromyalgia     Generalized headaches     GERD (gastroesophageal reflux disease)     Herniated disc, cervical     History of abnormal electrocardiogram 01/2016    History of colon polyps 2015    History of MRSA infection 2019    right hand/[facility]    Hyperlipemia 08/09/2009    Joint pain     Muscle disease or syndrome     Fibromyalgia    Nerve injury     Obesity     Osteoporosis     Pneumonia     Pulmonary emphysema (HCC)     Sleep apnea     Spinal headache     Spinal stenosis     Varicose veins         Allergies: Erythromycin, Strawberry, and Ascorbic acid      Brief Hospital Course:  The patient was admitted and the following issues were addressed during this hospitalization: (with pertinent details).  Patient was admitted to surgical floor following the below procedure. Physical therapy was initiated and patient was restarted on regular diet and any indicated home medications. They received oral pain medicine that was titrated to patient's needs. Labs were routinely followed and electrolytes were replaced as indicated. Prior to discharge patient was cleared by physical therapy, had adequate pain control, and had return of bowel function. Appropriate follow-up was scheduled.     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    Snoring 04/30/2022    Seasonal allergies 04/30/2022    Peripheral polyneuropathy 05/17/2021    Class 2 obesity in adult 05/26/2019    Family history of coronary artery disease 05/26/2019    Ambulates with cane 05/26/2019    Atypical chest pain 05/26/2019    S/P cervical spinal fusion 02/04/2019    Cervical stenosis of spine 10/28/2018    Tobacco abuse 10/24/2018    Preoperative cardiovascular examination 10/24/2018    Osteoporosis without current pathological fracture 10/15/2017    Cervical disc herniation 04/17/2017    Stenosis of cervical spine 04/17/2017    Cervical myelopathy (HCC)  04/17/2017    Cervical radiculopathy 04/17/2017    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    Pain of left lower extremity 11/06/2016    Hyperlipemia 08/09/2009 Precordial pain 08/09/2009         Condition at Discharge: Stable    Discharge Diagnoses:    Hospital Problems          Active Problems    * (Principal) Cervical spinal stenosis    Cervical radiculopathy    Cervical stenosis of spine    Pseudoarthrosis of cervical spine (HCC)     Principal Problem:    Cervical spinal stenosis  Active Problems:    Cervical radiculopathy    Cervical stenosis of spine    Pseudoarthrosis of cervical spine (HCC)      Surgical Procedures:   Procedure(s) (LRB):  REVISION ANTERIOR/POSTERIOR CERVICAL 5-7 DECOMPRESSION AND FUSION (N/A)  ARTHRODESIS SPINE ANTERIOR INTERBODY WITH DISCECTOMY/ OSTEOPHYTECTOMY/ DECOMPRESSION - CERVICAL BELOW C2 - EACH ADDITIONAL INTERSPACE (N/A)  INSERTION INTERBODY BIOMECHANICAL DEVICE WITH INTEGRAL ANTERIOR INSTRUMENTATION FOR DEVICE ANCHORING TO INTERVERTEBRAL DISC SPACE IN CONJUNCTION WITH INTERBODY ARTHRODESIS - EACH INTERSPACE (N/A)  POSTERIOR SEGMENTAL INSTRUMENTATION - 3 TO 6 VERTEBRAL SEGMENTS (N/A)  FUSION SPINE POSTERIOR - CERVICAL BELOW C2 (N/A)  FUSION SPINE POSTERIOR - LUMBAR - EACH ADDITIONAL SEGMENT (N/A)    Significant Diagnostic Studies and Procedures: noted in brief hospital course    Consults:    CONSULT REHABILITATION MEDICINE PHYSICIAN    Patient Disposition: Home       Patient instructions/medications:      Regular Diet    You have no dietary restriction. Please continue with a healthy balanced diet.    While taking pain medications:  - Drink plenty of fluids (not including alcohol)  - Add extra fiber to your diet  - Continue your stool softeners to help prevent constipation     Driving Restrictions    No driving while taking pain medication.     Strenuous Activity Restrictions    Please refrain from strenuous activity until after follow-up appointment.     Incision Care    LOOK AT YOUR DRESSING DAILY. IF THE GAUZE REMAINS CLEAN, YOU MAY LEAVE THIS CURRENT DRESSING IN PLACE FOR 1 WEEK FROM SURGERY    AFTER 08/28/2023, REMOVE THIS DRESSING AND BEGIN EVERY OTHER DAY DRESSING CHANGES WITH GAUZE AND TAPE PROVIDED.      YOU MAY BEGIN SHOWERING ONE WEEK AFTER SURGERY. PLEASE LEAVE THE CURRENT DRESSING IN PLACE WHEN TAKING YOUR FIRST SHOWER, THEN AFTER YOUR SHOWER, REMOVE DRESSING AND REPLACE WITH CLEAN, DRY DRESSING.     THERE SHOULD BE NO DRAINAGE COMING FROM YOUR WOUND AT ALL. YOUR DRESSINGS SHOULD BE CLEAN WHEN REMOVED. IF YOU NOTICE ANY DRAINAGE, YOU NEED TO CONTACT DR. Patria Mane OFFICE IMMEDIATELY.     Report These Signs and Symptoms    Contact your doctor if you have any of the following symptoms:    IF YOU NOTICE ANY DRAINAGE, YOU NEED TO CONTACT DR. Patria Mane OFFICE IMMEDIATELY.    *temperature higher than 100.3 degrees  *uncontrolled pain  *persistent nausea and/or vomiting  *calf pain or tenderness  *unable to urinate  *unable to have a bowel movement  *continued or increased drainage    Call 911 if you experience difficulty breathing, chest pain, or severe calf pain or swelling.    You will be discharged with the maximum amount of narcotic pain medicine appropriate for your injury/surgery.  It is understood and expected for you to experience a significant  amount of pain  postoperatively in the first few days that will improve with time.  A combination of sharp and blunt trauma was used to accomplish your surgery and this very well could be the most pain you have ever  experienced.  We expect this to improve with time and we are already very proud of your recovery and feel you are safe to be discharged at this point and time.  Due to the potential for opioid overuse and addiction, we feel any more narcotics puts you at greater risk than your current level of pain.    Other encouraged ways to cope and manage pain include ice, elevation, sleep, and peer or family support.    Please do not operate heavy machinery, drive, or consume alcohol while taking narcotic pain medicine.    If you do not take the prescribed amount, you are strongly encouraged to dispose your prescriptions at the appropriate disposal locations.    Please call your surgeon's clinic at the number listed in your discharge instructions if there are any questions or concerns.     Questions About Your Stay    IF YOU HAVE ANY QUESTIONS OR CONCERNS, PLEASE CONTACT DR. Patria Mane OFFICE, PLEASE CALL HIS NURSE JULIA [phone], OR SEND THEM A MESSAGE THROUGH MY CHART      IF YOU HAVE ANY QUESTIONS OR CONCERNS AFTER HOURS, PLEASE CALL [phone] AND ASK FOR THE ORTHOPEDIC RESIDENT ON CALL     Discharging attending physician: Laneta Simmers T [id number]      Lifting Restrictions    Do not lift more than 5 pounds until after follow-up appointment.     Bathing Restrictions    Keep your incision dry.    YOU MAY BEGIN SHOWERING IN 2 DAYS. YOU MAY SHOWER WITH A DRESSING IN PLACE. CHANGE YOUR DRESSING IMMEDIATELY AFTER SHOWERING, EVEN IF YOU DO NOT BELIEVE THE DRESSING HAS GOTTEN WET.      Until your incision is healed DO NOT submerge/soak your incision site. Avoid swimming and hot tubs. Avoid baths that allow your incision to soak.    Your doctor may give you more instructions at your follow-up appointment.      Current Discharge Medication List         START taking these medications    Details   docusate (COLACE) 100 mg capsule Take one capsule by mouth twice daily. Indications: constipation  Qty: 180 capsule, Refills: 0    PRESCRIPTION TYPE:  Normal      HYDROcodone/acetaminophen (NORCO) 5/325 mg tablet Take one tablet to two tablets by mouth every 4 hours as needed. Indications: pain  Qty: 70 tablet, Refills: 0    PRESCRIPTION TYPE:  Normal            CONTINUE these medications which have NOT CHANGED    Details   acetaminophen SR (TYLENOL 8 HOUR) 650 mg tablet Take one tablet by mouth every 8 hours as needed for Pain.    PRESCRIPTION TYPE:  Historical Med      albuterol sulfate (PROAIR HFA) 90 mcg/actuation HFA aerosol inhaler Inhale two puffs by mouth into the lungs every 6 hours as needed for Wheezing or Shortness of Breath. Shake well before use.    PRESCRIPTION TYPE:  Historical Med      calcium carbonate (CALCIUM 600 PO) Take 1 tablet by mouth daily.    PRESCRIPTION TYPE:  Historical Med      celecoxib (CELEBREX) 200 mg capsule Take one capsule by  mouth daily.    PRESCRIPTION TYPE:  Historical Med      chlorhexidine gluconate (PERIDEX) 0.12 % mouthwash Swish and Spit 15 mL by mouth as directed twice daily.    PRESCRIPTION TYPE:  Historical Med      CHOLEcalciferoL (vitamin D3) 50 mcg (2,000 unit) tablet Take one tablet by mouth daily.    PRESCRIPTION TYPE:  Historical Med      clonazePAM (KLONOPIN) 1 mg tablet Take one tablet by mouth daily as needed.    PRESCRIPTION TYPE:  Historical Med      cyanocobalamin (vitamin B-12) 1,000 mcg tablet Take one tablet by mouth daily.    PRESCRIPTION TYPE:  Historical Med      cyclobenzaprine (FLEXERIL) 5 mg tablet TAKE ONE TABLET BY MOUTH THREE TIMES DAILY AS NEEDED FOR MUSCLE CRAMPS.  Qty: 90 tablet, Refills: 3    PRESCRIPTION TYPE:  Normal      denosumab (PROLIA) 60 mg/mL injection Inject 1 mL under the skin every 180 days.    PRESCRIPTION TYPE:  Historical Med      diclofenac (VOLTAREN) 1 % topical gel Apply four g topically to affected area four times daily as needed.    PRESCRIPTION TYPE:  Historical Med      FLUoxetine (PROZAC) 40 mg capsule Take one capsule by mouth daily.    PRESCRIPTION TYPE:  Historical Med      fluticasone propionate (FLONASE ALLERGY RELIEF) 50 mcg/actuation nasal spray, suspension Apply two sprays to each nostril as directed daily.  Qty: 48 g, Refills: 1    PRESCRIPTION TYPE:  Normal  Associated Diagnoses: Seasonal allergies      fluticasone-salmeterol (ADVAIR DISKUS) 250-50 mcg/dose inhalation disk Inhale one puff by mouth into the lungs twice daily.    PRESCRIPTION TYPE:  Historical Med      lidocaine (BLUE-EMU LIDOCAINE PATCH) 4 % topical patch Apply one patch topically to affected area daily.    PRESCRIPTION TYPE:  Historical Med lidocaine/prilocaine (EMLA) 2.5/2.5 % topical cream APPLY TOPICALLY TO AFFECTED AREA AS NEEDED.  Qty: 30 g, Refills: 2    PRESCRIPTION TYPE:  Normal  Comments: DX Code Needed  .  Associated Diagnoses: Lumbar radiculopathy; Lumbar spondylosis; Myofascial pain      lisinopriL (ZESTRIL) 40 mg tablet Take one tablet by mouth daily. for high blood pressure    PRESCRIPTION TYPE:  Historical Med      Magnesium 250 mg tab Take one tablet by mouth daily.    PRESCRIPTION TYPE:  Historical Med      mirtazapine (REMERON) 30 mg tablet Take one tablet by mouth at bedtime daily.    PRESCRIPTION TYPE:  Historical Med      multivitamin (ONE-A-DAY) tablet Take one tablet by mouth daily.    PRESCRIPTION TYPE:  Historical Med      mupirocin (BACTROBAN) 2 % topical ointment Apply  topically to affected area twice daily.  Qty: 22 g, Refills: 11    PRESCRIPTION TYPE:  Normal  Comments: Mix mupirocin ointment in a 1:1 ratio with triamcinolone. Apply to lesions daily and cover for 2-3 weeks until resolved      omeprazole DR(+) (PRILOSEC) 40 mg capsule Take one capsule by mouth twice daily.    PRESCRIPTION TYPE:  Historical Med      pregabalin (LYRICA) 200 mg capsule Take one capsule by mouth three times daily.  Qty: 270 capsule, Refills: 1    PRESCRIPTION TYPE:  Normal  Associated Diagnoses: Lumbar radiculopathy; Spondylolisthesis, lumbar region; Acute exacerbation of  chronic low back pain      rosuvastatin (CRESTOR) 10 mg tablet TAKE 1 TABLET BY MOUTH EVERY DAY  Qty: 90 tablet, Refills: 3    PRESCRIPTION TYPE:  Normal      terbinafine HCL (LAMISIL) 250 mg tablet Take one tablet by mouth daily.    PRESCRIPTION TYPE:  Historical Med      varenicline (CHANTIX) 1 mg tablet Take one tablet by mouth twice daily.    PRESCRIPTION TYPE:  Historical Med      walker (ULTRA-LIGHT ROLLATOR) medical supply Use as directed.  Qty: 1 each, Refills: 0    PRESCRIPTION TYPE:  Normal              Future Appointments   Date Time Provider Department Center 09/27/2023  2:15 PM Criss Rosales, MD [facility] SPINE   11/22/2023 10:45 AM Criss Rosales, MD [facility] SPINE   02/07/2024  1:00 PM Irven Easterly, APRN-NP [facility] IM         Signed:  Celene Skeen, APRN-NP  08/28/2023      cc:  Primary Care Physician:  Rockwell Germany   Verified  Referring physicians:     Additional provider(s):

## 2023-08-28 NOTE — Telephone Encounter
Patient Kristen Vaughn stating she had drainage coming from her incision last night and this morning. She said lat night she felt like there was something running down her back but did not think anything of it. She said this morning they noticed drainage on her dressing so she took a shower and they changed the dressing. She said the drainage was clear. Her caregiver said her incision looks good. She denies redness, swelling, and fevers. She is not sure if it is still draining. RN instructed her to have her caregiver send a picture of her incision and dressing to [place] and we will take a look at it.

## 2023-08-30 ENCOUNTER — Encounter: Admit: 2023-08-30 | Discharge: 2023-08-30 | Payer: MEDICARE

## 2023-08-30 NOTE — Telephone Encounter
Pt called and stated that she just got out of the hospital and was not able to use her cpap during that 6 day stay.  Pt stated that she is unable to get supplies per [facility].    Called and spoke with rep at [facility] and they stated that they would have someone call me about this.   Laurie Panda RN

## 2023-09-02 ENCOUNTER — Encounter: Admit: 2023-09-02 | Discharge: 2023-09-02 | Payer: MEDICARE

## 2023-09-20 ENCOUNTER — Encounter: Admit: 2023-09-20 | Discharge: 2023-09-20 | Payer: MEDICARE

## 2023-09-20 MED ORDER — HYDROCODONE-ACETAMINOPHEN 5-325 MG PO TAB
1-2 | ORAL_TABLET | ORAL | 0 refills | 30.00000 days | Status: AC | PRN
Start: 2023-09-20 — End: ?

## 2023-09-23 ENCOUNTER — Encounter: Admit: 2023-09-23 | Discharge: 2023-09-23 | Payer: MEDICARE

## 2023-09-24 ENCOUNTER — Encounter: Admit: 2023-09-24 | Discharge: 2023-09-24 | Payer: MEDICARE

## 2023-09-24 MED ORDER — HYDROCODONE-ACETAMINOPHEN 5-325 MG PO TAB
1-2 | ORAL_TABLET | ORAL | 0 refills | 30.00000 days | Status: AC | PRN
Start: 2023-09-24 — End: ?

## 2023-09-24 NOTE — Telephone Encounter
Patient's caregiver LVM stating the Hydrocodone that was sent to [facility] would not be able to be filled until Friday. She is requesting script be sent to [facility] in [place]. RN called [facility] who confirmed they would not be able to fill it until Friday due to the shortage. Script canceled with [facility]. RN called patient to let her know a new script would be sent to [facility] pharmacy once it is signed by the provider. She v/u.

## 2023-09-26 ENCOUNTER — Encounter: Admit: 2023-09-26 | Discharge: 2023-09-26 | Payer: MEDICARE

## 2023-09-26 DIAGNOSIS — Z09 Encounter for follow-up examination after completed treatment for conditions other than malignant neoplasm: Secondary | ICD-10-CM

## 2023-09-27 ENCOUNTER — Ambulatory Visit: Admit: 2023-09-27 | Discharge: 2023-09-28 | Payer: MEDICARE

## 2023-09-27 ENCOUNTER — Encounter: Admit: 2023-09-27 | Discharge: 2023-09-27 | Payer: MEDICARE

## 2023-10-02 ENCOUNTER — Encounter: Admit: 2023-10-02 | Discharge: 2023-10-02 | Payer: MEDICARE

## 2023-10-02 NOTE — Telephone Encounter
An encounter has been created for documentation only (often for preparation of an upcoming appointment or for follow up on orders/imaging) and patient does not need contact RN and did not miss a phone call or appointment.     RN prepped patient information for clinic with Christie Beckers APRN NP on 11/20/23 at 1130 via in person appt.     Here for 12 mo follow up  Dx:OSA, RLS  Pt known to sleep department by Dr. Clarene Critchley    Last SS: 08/26/22 with AHI 12.9  Oximetry 01/10/2023   Previous cpap 8-18    Smoking a couple of cigarettes a day, on chantix   History of asthma- on Wixela      LV: 08/12/23 with Christie Beckers   Meds: albuterol, flonase, advair  are all on med list     RLS  Meds:lyrica-managed by other provider    Tobacco abuse   -LDCT screening thru pcp.     Pt had sx for revision anterior/posterior cervical 5-7 decompression and fusion on 08/21/23     DME [facility] Mendel Ryder  RN obtained download from AV

## 2023-10-06 ENCOUNTER — Encounter: Admit: 2023-10-06 | Discharge: 2023-10-06 | Payer: MEDICARE

## 2023-10-21 ENCOUNTER — Encounter: Admit: 2023-10-21 | Discharge: 2023-10-21 | Payer: MEDICARE

## 2023-10-31 ENCOUNTER — Encounter: Admit: 2023-10-31 | Discharge: 2023-10-31 | Payer: MEDICARE

## 2023-10-31 DIAGNOSIS — Z09 Encounter for follow-up examination after completed treatment for conditions other than malignant neoplasm: Secondary | ICD-10-CM

## 2023-10-31 DIAGNOSIS — Z981 Arthrodesis status: Secondary | ICD-10-CM

## 2023-11-01 ENCOUNTER — Encounter: Admit: 2023-11-01 | Discharge: 2023-11-01 | Payer: MEDICARE

## 2023-11-01 DIAGNOSIS — M5416 Radiculopathy, lumbar region: Secondary | ICD-10-CM

## 2023-11-01 DIAGNOSIS — M4316 Spondylolisthesis, lumbar region: Secondary | ICD-10-CM

## 2023-11-01 DIAGNOSIS — M545 Acute exacerbation of chronic low back pain: Secondary | ICD-10-CM

## 2023-11-01 MED ORDER — PREGABALIN 200 MG PO CAP
200 mg | ORAL_CAPSULE | Freq: Three times a day (TID) | ORAL | 0 refills | Status: AC
Start: 2023-11-01 — End: ?

## 2023-11-03 ENCOUNTER — Encounter: Admit: 2023-11-03 | Discharge: 2023-11-03 | Payer: MEDICARE

## 2023-11-04 ENCOUNTER — Encounter: Admit: 2023-11-04 | Discharge: 2023-11-04 | Payer: MEDICARE

## 2023-11-04 ENCOUNTER — Ambulatory Visit: Admit: 2023-11-04 | Discharge: 2023-11-04 | Payer: MEDICARE

## 2023-11-10 ENCOUNTER — Encounter: Admit: 2023-11-10 | Discharge: 2023-11-10 | Payer: MEDICARE

## 2023-11-12 ENCOUNTER — Encounter: Admit: 2023-11-12 | Discharge: 2023-11-12 | Payer: MEDICARE

## 2023-11-12 NOTE — Telephone Encounter
 Pt called and wondered if the upcoming appt on 11/20/23 can be switched to telehealth if all possible.    [facility] sent to pt to swithch to [facility].  Laurie Panda RN

## 2023-11-15 ENCOUNTER — Encounter: Admit: 2023-11-15 | Discharge: 2023-11-15 | Payer: MEDICARE

## 2023-11-15 DIAGNOSIS — M47816 Spondylosis without myelopathy or radiculopathy, lumbar region: Secondary | ICD-10-CM

## 2023-11-15 DIAGNOSIS — M5416 Radiculopathy, lumbar region: Secondary | ICD-10-CM

## 2023-11-15 DIAGNOSIS — M7918 Myalgia, other site: Secondary | ICD-10-CM

## 2023-11-15 MED ORDER — LIDOCAINE-PRILOCAINE 2.5-2.5 % TP CREA
TOPICAL | 2 refills | Status: AC | PRN
Start: 2023-11-15 — End: ?

## 2023-11-15 NOTE — Telephone Encounter
 Medication:EMLA cream  Last office visit:11/04/23  Next office visit:Not scheduled  Last script: 08/13/23  Last refill: 10/02/2023  Labs:

## 2023-11-17 ENCOUNTER — Encounter: Admit: 2023-11-17 | Discharge: 2023-11-17 | Payer: MEDICARE

## 2023-11-18 ENCOUNTER — Encounter: Admit: 2023-11-18 | Discharge: 2023-11-18 | Payer: MEDICARE

## 2023-11-18 ENCOUNTER — Ambulatory Visit: Admit: 2023-11-18 | Discharge: 2023-11-19 | Payer: MEDICARE

## 2023-11-18 ENCOUNTER — Ambulatory Visit: Admit: 2023-11-18 | Discharge: 2023-11-18 | Payer: MEDICARE

## 2023-11-18 DIAGNOSIS — M5416 Radiculopathy, lumbar region: Secondary | ICD-10-CM

## 2023-11-18 MED ORDER — IOHEXOL 300 MG IODINE/ML IV SOLN
1 mL | Freq: Once | 0 refills | Status: CP
Start: 2023-11-18 — End: ?

## 2023-11-18 MED ORDER — LIDOCAINE (PF) 10 MG/ML (1 %) IJ SOLN
2 mL | Freq: Once | INTRAMUSCULAR | 0 refills | Status: CP
Start: 2023-11-18 — End: ?

## 2023-11-18 MED ORDER — TRIAMCINOLONE ACETONIDE 40 MG/ML IJ SUSP
80 mg | Freq: Once | EPIDURAL | 0 refills | Status: CP
Start: 2023-11-18 — End: ?

## 2023-11-18 MED ORDER — SODIUM CHLORIDE 0.9 % IJ SOLN
3 mL | Freq: Once | INTRAMUSCULAR | 0 refills | Status: CP
Start: 2023-11-18 — End: ?

## 2023-11-18 NOTE — Discharge Instructions - Supplementary Instructions
 GENERAL POST PROCEDURE INSTRUCTIONS  Physician: _________________________________  Procedure Completed Today:  Joint Injection (hip, knee, shoulder)  Cervical Epidural Steroid Injection  Cervical Transforaminal Steroid Injection  Trigger Point Injection  Caudal Epidural Steroid Injection  Piriformis Injection  Pudendal Nerve Block  Other _____________________ Thoracic Epidural Steroid Injection  Lumbar Epidural Steroid Injection  Lumbar Transforaminal Steroid Injection  Facet Joint Injection  Celiac Nerve Block  Sacrococcygeal  Sacroiliac Joint Injection   Important information following your procedure today:  You may drive today     If you had sedation, you may NOT drive today  Rest at home for the next 6 hours.  You may then begin to resume your normal activities.  DO NOT drive any vehicle, operate any power tools, drink alcohol, make any major decisions, or sign any legal documents for the next 12 hours.  Pain relief may not be immediate. It is possible you may even experience an increase in pain during the first 24-48 hours followed by a gradual decrease of your pain.  Though the procedure is generally safe, and complications are rare, we do ask that you be aware of any of the following:  Any swelling, persistent redness, new bleeding or drainage from the site of the injection.  You should not experience a severe headache.  You should not run a fever over 101oF.  New onset of sharp, severe back and or neck pain.  New onset of upper or lower extremity numbness or weakness.  New difficulty controlling bowel or bladder function after injection.  New shortness of breath.  ** If any of these occur, please call to report this occurrence to the nurse of Dr. Katrinka Blazing at [phone]. If you are calling after 4:00 p.m. or on weekends or holidays, please call [phone] and ask to have the resident physician on call for the physician paged or go to your local emergency room.  You may experience soreness at the injection site. Ice can be applied at 20-minute intervals for the first 24 hours. The following day you may alternate ice with heat if you are experiencing muscle tightness, otherwise continue with ice. Ice works best at decreasing pain. Avoid application of direct heat, hot showers or hot tubs today.  Avoid strenuous activity today. You many resume your regular activities and exercise tomorrow.  Patients with diabetes may see an elevation in blood sugars for 7-10 days after the injection. It is important to pay close attention to your diet, check your blood sugars daily and report extreme elevations to the physician that manages your diabetes.  Patients taking daily blood thinners can resume their regular dose this evening.  It is important that you take all medications ordered by your pain physician. Taking medications as ordered is an important part of your pain care plan. If you cannot continue the medication plan, please notify the physician.    Possible side effects to steroids that may occur:  Flushing or redness of the face  Irritability  Fluid retention  Change in women's menses  Minor headache    If you are unable to keep your upcoming appointment, please notify the Spine Center scheduler at [phone] at least 24 hours in advance. If you have questions for the surgery center, call [facility] at [phone].

## 2023-11-18 NOTE — Progress Notes
 Telehealth Visit Note    Date of Service: 11/20/2023    Subjective:           Kristen Vaughn is a 62 y.o. female.    History of Present Illness  I had the pleasure of seeing Kristen Vaughn in clinic for OSA follow-up. She is known to Dr. Clarene Critchley. She has mild OSA (AHI 12) and is on CPAP 5-15. Noc ox normal   Wearing full face mask, mask is leaking, waking up with dry eyes    Getting supplies ok from DME  Pressure is comfortable  Denies shortness of breath, gasping for air, chest pain at night     Smoking a little bit, about a day   History of asthma- on Wixela, Advair on the med list, no concerns right now    Normal PFT's 10/2021     Patient is S/P anterior and posterior C5-C7 decompression and fusion with Dr. Liam Rogers on 08/21/2023         11/17/2023     8:46 PM 08/11/2023    10:45 AM 07/13/2022     8:40 AM 05/09/2022     2:38 PM   Epworth Sleepiness Scale   Sitting and reading 0 0 0 3   Watching TV 2 0 0 2   Sitting inactive in a public place (e.g. a theater or a meeting) 0 0 0 2   As a passenger in a car for an hour without a break 0 0 0 2   Lying down to rest in the afternoon when circumstances permit 1 1 0 2   Sitting and talking to someone 0 0 0 1   Sitting quietly after a lunch without alcohol 0 0 2 2   In a car, while stopped for a few minutes in traffic 0 0 0 0   Epworth Sleepiness Scale Score 3  1 2 14        Patient-reported                   Objective:          acetaminophen SR (TYLENOL 8 HOUR) 650 mg tablet Take one tablet by mouth every 8 hours as needed for Pain.    albuterol sulfate (PROAIR HFA) 90 mcg/actuation HFA aerosol inhaler Inhale two puffs by mouth into the lungs every 6 hours as needed for Wheezing or Shortness of Breath. Shake well before use.    calcium carbonate (CALCIUM 600 PO) Take 1 tablet by mouth daily.    celecoxib (CELEBREX) 200 mg capsule Take one capsule by mouth daily.    chlorhexidine gluconate (PERIDEX) 0.12 % mouthwash Swish and Spit 15 mL by mouth as directed twice daily. CHOLEcalciferoL (vitamin D3) 50 mcg (2,000 unit) tablet Take one tablet by mouth daily.    clonazePAM (KLONOPIN) 1 mg tablet Take one tablet by mouth daily as needed.    cyanocobalamin (vitamin B-12) 1,000 mcg tablet Take one tablet by mouth daily.    cyclobenzaprine (FLEXERIL) 5 mg tablet TAKE ONE TABLET BY MOUTH THREE TIMES DAILY AS NEEDED FOR MUSCLE CRAMPS.    denosumab (PROLIA) 60 mg/mL injection Inject 1 mL under the skin every 180 days.    diclofenac (VOLTAREN) 1 % topical gel Apply four g topically to affected area four times daily as needed.    docusate (COLACE) 100 mg capsule Take one capsule by mouth twice daily. Indications: constipation    FLUoxetine (PROZAC) 40 mg capsule Take one capsule by mouth daily.  fluticasone propionate (FLONASE ALLERGY RELIEF) 50 mcg/actuation nasal spray, suspension Apply two sprays to each nostril as directed daily.    fluticasone-salmeterol (ADVAIR DISKUS) 250-50 mcg/dose inhalation disk Inhale one puff by mouth into the lungs twice daily.    lamoTRIgine (LAMICTAL) 100 mg tablet     lidocaine (BLUE-EMU LIDOCAINE PATCH) 4 % topical patch Apply one patch topically to affected area daily.    lidocaine/prilocaine (EMLA) 2.5/2.5 % topical cream APPLY TOPICALLY TO AFFECTED AREA AS NEEDED.    lisinopriL (ZESTRIL) 40 mg tablet Take one tablet by mouth daily. for high blood pressure    Magnesium 250 mg tab Take one tablet by mouth daily.    mirtazapine (REMERON) 30 mg tablet Take one tablet by mouth at bedtime daily.    multivitamin (ONE-A-DAY) tablet Take one tablet by mouth daily.    mupirocin (BACTROBAN) 2 % topical ointment Apply  topically to affected area twice daily.    omeprazole DR(+) (PRILOSEC) 40 mg capsule Take one capsule by mouth twice daily.    pregabalin (LYRICA) 200 mg capsule TAKE 1 CAPSULE BY MOUTH THREE TIMES A DAY    rosuvastatin (CRESTOR) 10 mg tablet TAKE 1 TABLET BY MOUTH EVERY DAY    tiZANidine (ZANAFLEX) 4 mg tablet Take one tablet by mouth three times daily.    walker (ULTRA-LIGHT ROLLATOR) medical supply Use as directed.           Computed Telehealth Body Mass Index unavailable. One or more values for this score either were not found within the given timeframe or did not fit some other criterion.    Physical Exam  Constitutional:       Appearance: Normal appearance.   Pulmonary:      Effort: Pulmonary effort is normal. No respiratory distress.   Skin:     General: Skin is dry.   Neurological:      Mental Status: She is alert and oriented to person, place, and time.   Psychiatric:         Mood and Affect: Mood normal.         Behavior: Behavior normal.         Thought Content: Thought content normal.         Judgment: Judgment normal.                Assessment and Plan:    Problem   Osa (Obstructive Sleep Apnea)    HST 08/26/2022  BMI 38 243lb  AHI (4%) 12.9 supine 22.5  O2 low 72.7%, TIme <88% 306 minutes    Pt set up with AutoPAP @ 5-15cm H2O on 12/06/22.  DME: [facility]  Insurance: [employer]  If the patient has to meet compliance, their compliance window will be from 01/06/23 to 03/06/23.     Oximetry 01/10/2023  On CPAP 5-15  Below 88% for <1 minute  Oxygen Nadir 87%           OSA (obstructive sleep apnea)  Reviewed CPAP download. 29/30 days of usage.   Wearing 4 hours or greater 63% of nights. The average use on days used was 4 hours and 47 minutes.  Residual AHI 3.2.    The 95th percentile pressure was 15.2 cmH20, with a maximum of 16.3 cmH20, and a median of 11.6 cmH20.  The median leak was 7.5 L/min. She is tolerating and benefiting from therapy.     - Will continue current settings, CPAP 8-18. Discussed mask leak, will check download in  a few weeks to see how she is doing. Should be getting out of the c-collar at the end of the week.     - Keep in contact with DME for routine supplies, need to contact us directly if unable to communicate with DME in a timely manner.     She has our contact information and was encouraged to call us with any questions or concerns.    RTC in 6 months         15 minutes spent on this patient's encounter with counseling and coordination of care taking >50% of the visit.

## 2023-11-18 NOTE — Procedures
 Attending Surgeon: [name], MD    Anesthesia: Local    Pre-Procedure Diagnosis:   1. Lumbar radiculopathy        Post-Procedure Diagnosis:   1. Lumbar radiculopathy             Epidural Steroid Injection Lumbar/Caudal  Procedure: epidural - interlaminar    Laterality: n/a   on 11/18/2023 2:15 PM  Location: caudal - Caudal      Consent:   Consent obtained: verbal and written  Consent given by: patient  Risks discussed: allergic reaction, bleeding, bruising, infection, nerve damage, no change or worsening in pain, reaction to medication and weakness    Discussed with patient the purpose of the treatment/procedure, other ways of treating my condition, including no treatment/ procedure and the risks and benefits of the alternatives. Patient has decided to proceed with treatment/procedure.        Universal Protocol:  Relevant documents: relevant documents present and verified  Test results: test results available and properly labeled  Imaging studies: imaging studies available  Required items: required blood products, implants, devices, and special equipment available  Site marked: the operative site was marked  Patient identity confirmed: Patient identify confirmed verbally with patient.        Time out: Immediately prior to procedure a time out was called to verify the correct patient, procedure, equipment, support staff and site/side marked as required      Procedures Details:   Indications: pain   Prep: chlorhexidine  Patient position: prone  Estimated Blood Loss: minimal  Specimens: none  Number of Levels: 1  Approach: midline  Guidance: fluoroscopy  Contrast: Procedure confirmed with contrast under live fluoroscopy.  Needle and Epidural Catheter: quincke  Needle size: 22 G  Injection procedure: Incremental injection and Negative aspiration for blood  Patient tolerance: Patient tolerated the procedure well with no immediate complications. Pressure was applied, and hemostasis was accomplished.  Comments: DESCRIPTION OF PROCEDURE:  The procedure risks and benefits were explained and informed consent was obtained from the patient.  The patient was positioned prone on the fluoroscopy table with a pillow under the abdomen.  Blood pressure cuff and oxygen saturation monitors were attached.  The patient was monitored throughout the procedure.  The sacral hiatus was identified with the use of fluoroscopy in the lateral view.  The skin was prepped with chlorhexidine and draped in aseptic fashion.  The skin and subcutaneous tissue were anesthetized using 4 mL of 1 percent lidocaine with a 27-gauge needle.  A 3.5-inch, 22-gauge needle was slowly advanced in the lateral view towards the sacral hiatus.  Once the needle was in position, 0.6 mL of contrast was injected and demonstrated epidural spread.  After negative aspiration, a 6 mL solution containing 2 mL of normal saline and 2 mL of 1 percent lidocaine and 80 mg of Kenalog was injected in increments.  The stylet was reinserted and needle was removed.  After the procedure, the patient's blood pressure, heart rate, oxygen saturation, and VAS were recorded in the chart.     There were no complications. The patient tolerated procedure well and was brought to the recovery room for observation in stable condition with discharge instructions     PLAN OF CARE:  The patient follow up in clinic in 3 weeks.     The patient was advised to contact the [facility] for any of the following.  Fever, chills, or night sweats.  New onset of severe sharp pain.  Any new upper  or lower extremity weakness or numbness.  Any questions regarding the procedure.     If unable to contact the [facility], the patient was instructed to go to local emergency room.         This patient's clinical history, exam, AND imaging support radiculopathy AND there is a significant impact on quality of life and function AND the pain has been present for at least 4 weeks AND they have failed to improve with noninvasive conservative care.  This patient had at least 50% pain relief for at least 3 months with the last epidural injection.    This patient's pain is so severe it results in a significant degree of disability. Prior ESI has provided at least a 50% improvement in pain and function for at least 3 months. The patient's Primary Care Physician has been notified of the continuation of this procedure and prolonged repeat steroid use. The patient does not desire surgery.  Administrations This Visit       iohexoL (OMNIPAQUE-300) 300 mg/mL injection 1 mL       Admin Date  11/18/2023 Action  Given Dose  1 mL Route  SEE ADMIN INSTRUCTIONS Documented By  Kennith Maes, RN              lidocaine PF 1% (10 mg/mL) injection 2 mL       Admin Date  11/18/2023 Action  Given Dose  2 mL Route  Injection Documented By  Kennith Maes, RN              sodium chloride PF 0.9% injection 3 mL       Admin Date  11/18/2023 Action  Given Dose  3 mL Route  Injection Documented By  Kennith Maes, RN              triamcinolone acetonide [facility]) injection 80 mg       Admin Date  11/18/2023 Action  Given Dose  80 mg Route  Epidural Documented By  Kennith Maes, RN                  Estimated blood loss: none or minimal  Specimens: none  Patient tolerated the procedure well with no immediate complications. Pressure was applied, and hemostasis was accomplished.

## 2023-11-18 NOTE — Progress Notes
 [facility]  INTERVENTIONAL PAIN PROCEDURE HISTORY AND PHYSICAL    Chief Complaint: Pain    HISTORY OF PRESENT ILLNESS:    Kristen Vaughn is a 63 y.o. year old female who presents for for interventional treatment of radicular pain. Patient denies any recent fevers, chills, infection, antibiotics, coagulopathy or contra-indicated anticoagulants. Risks of the procedure were discussed including but not limited to bleeding, infection, damage to surrounding structures and reaction to medications. Patient reports understanding and has elected to proceed with the procedure.  This patient's clinical history, exam, AND imaging support radiculopathy AND there is a significant impact on quality of life and function AND their pain score has been documented in this note AND the pain has been present for at least 4 weeks AND they have failed to improve with noninvasive conservative care. This patient had at least 50% pain relief for at least 3 months with the last epidural injection.       Past Medical History:    Allergy    Anxiety and depression    Arthritis    Asthma    Brain bleed (HCC)    Breast disorder    Carpal tunnel syndrome    Cervical myelopathy (HCC)    Cervical stenosis of spine    Chest pain    Constipation    Degenerative disc disease, cervical    Dizziness    Essential hypertension    Fibromyalgia    Generalized headaches    GERD (gastroesophageal reflux disease)    Herniated disc, cervical    History of abnormal electrocardiogram    History of colon polyps    History of MRSA infection    Hyperlipemia    Inflammatory arthritis    Joint pain    Muscle disease or syndrome    Nerve injury    Obesity    Osteoporosis    Pneumonia    Pulmonary emphysema (HCC)    Pulmonary hypertension (HCC)    Seasonal allergic reaction    Sleep apnea    Spinal headache    Spinal stenosis    Varicose veins       Surgical History:   Procedure Laterality Date    HX HYSTERECTOMY  1985    UPPER GASTROINTESTINAL ENDOSCOPY  2015 COLONOSCOPY  2015    hx colon polyps x2    CARDIOVASCULAR STRESS TEST  01/2016    FINGER TRIGGER RELEASE Right 2019    CARPAL TUNNEL RELEASE Right 2019    ANTERIOR CERVICAL DECOMPRESSION AND FUSION AT CERVICAL 6-7 N/A 10/28/2018    Performed by Criss Rosales, MD at [facility] OR    ARTHRODESIS SPINE ANTERIOR INTERBODY WITH DISCECTOMY/ OSTEOPHYTECTOMY/ DECOMPRESSION - CERVICAL BELOW C2 - EACH ADDITIONAL INTERSPACE N/A 10/28/2018    Performed by Criss Rosales, MD at [facility] OR    ANTERIOR INSTRUMENTATION - 2 TO 3 VERTEBRAL SEGMENTS N/A 10/28/2018    Performed by Criss Rosales, MD at [facility] OR    REVISION ANTERIOR/POSTERIOR CERVICAL 5-7 DECOMPRESSION AND FUSION N/A 08/21/2023    Performed by Criss Rosales, MD at [facility] OR    ARTHRODESIS SPINE ANTERIOR INTERBODY WITH DISCECTOMY/ OSTEOPHYTECTOMY/ DECOMPRESSION - CERVICAL BELOW C2 - EACH ADDITIONAL INTERSPACE N/A 08/21/2023    Performed by Criss Rosales, MD at [facility] OR    INSERTION INTERBODY BIOMECHANICAL DEVICE WITH INTEGRAL ANTERIOR INSTRUMENTATION FOR DEVICE ANCHORING TO INTERVERTEBRAL DISC SPACE IN CONJUNCTION WITH INTERBODY ARTHRODESIS - EACH INTERSPACE N/A 08/21/2023    Performed by Criss Rosales, MD at  [facility] OR    POSTERIOR SEGMENTAL INSTRUMENTATION - 3 TO 6 VERTEBRAL SEGMENTS N/A 08/21/2023    Performed by Criss Rosales, MD at [facility] OR    FUSION SPINE POSTERIOR - CERVICAL BELOW C2 N/A 08/21/2023    Performed by Criss Rosales, MD at [facility] OR    FUSION SPINE POSTERIOR - LUMBAR - EACH ADDITIONAL SEGMENT N/A 08/21/2023    Performed by Criss Rosales, MD at [facility] OR    ABDOMEN SURGERY      DILATION AND CURETTAGE  1980's    HX TUBAL LIGATION      LAP CHOLECYSTECTOMY      SPINE SURGERY      TILT TABLE STUDY         family history includes Alcohol liver disease in her brother; Arthritis in her father; Asthma in her sister; Back pain in her brother; COPD in her brother; Cancer in her sister, sister and another family member; Coronary Artery Disease in her father, mother, and other family members; Diabetes in her brother and mother; Hypertension in her father; Joint Pain in her sister; Neck Pain in her sister; Osteoporosis in her sister; Premature Heart Disease in an other family member; Stroke in an other family member.    Social History     Socioeconomic History    Marital status: Single   Tobacco Use    Smoking status: Former     Current packs/day: 0.00     Average packs/day: 0.3 packs/day for 46.7 years (12.0 ttl pk-yrs)     Types: Cigarettes     Quit date: 03/12/2023     Years since quitting: 0.6     Passive exposure: Current    Smokeless tobacco: Never    Tobacco comments:     variable 1-1/2 a day, usually 1 or 2 per day.   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    Sexual activity: Not Currently     Partners: Male     Birth control/protection: Condom, None       Allergies   Allergen Reactions    Erythromycin RASH     Rash on trunk    Strawberry RASH    Ascorbic Acid NAUSEA ONLY and UNKNOWN       Vitals:    11/18/23 1416   BP: 121/78   BP Source: Arm, Right Upper   Pulse: 70   Temp: 36.3 ?C (97.4 ?F)   SpO2: 96%   O2 Device: None (Room air)   Weight: 110.2 kg (243 lb)   Height: 170.2 cm (5' 7)        Oswestry Total Score:: (Patient-Rptd) 62    REVIEW OF SYSTEMS: 10 point ROS obtained and negative except back pain      PHYSICAL EXAM:  General: No acute distress  HEENT: Normocephalic, atraumatic  Neck: No thyroidmegaly  Cardiovascular: Well perfused  Pulmonary: Unlabored respirations  Extremities: No cyanosis, clubbing, or edema  Skin: No lesions seen on exposed skin  Psychiatric:  Appropriate mood and affect  Musculoskeletal: No atrophy.   Neurologic: Antigravity strength in all extremities.  CN II -XII grossly intact. Alert and oriented x 3.      IMPRESSION:    1. Lumbar radiculopathy         PLAN: Caudal epidural steroid injection

## 2023-11-20 ENCOUNTER — Ambulatory Visit: Admit: 2023-11-20 | Discharge: 2023-11-21 | Payer: MEDICARE

## 2023-11-20 ENCOUNTER — Encounter: Admit: 2023-11-20 | Discharge: 2023-11-20 | Payer: MEDICARE

## 2023-11-20 DIAGNOSIS — G4733 Obstructive sleep apnea (adult) (pediatric): Secondary | ICD-10-CM

## 2023-11-20 NOTE — Telephone Encounter
 The patient's PAP compliance f/u office visit notes from 11/20/23 were faxed to their DME, [employer].

## 2023-11-20 NOTE — Patient Instructions
 If you have questions or concerns, please call my nurse at [phone] or feel free to send me a mychart message

## 2023-11-22 ENCOUNTER — Encounter: Admit: 2023-11-22 | Discharge: 2023-11-22 | Payer: MEDICARE

## 2023-11-22 ENCOUNTER — Ambulatory Visit: Admit: 2023-11-22 | Discharge: 2023-11-23 | Payer: MEDICARE

## 2023-11-22 ENCOUNTER — Ambulatory Visit: Admit: 2023-11-22 | Discharge: 2023-11-22 | Payer: MEDICARE

## 2023-11-22 DIAGNOSIS — M4316 Spondylolisthesis, lumbar region: Secondary | ICD-10-CM

## 2023-11-22 NOTE — Progress Notes
 [facility]    Subjective     REASON FOR VISIT   Pain of the Neck, Pain of the Lower Back, Pain of the Left Shoulder, and Pain of the Right Shoulder      SUBJECTIVE   Kristen Vaughn is a 63 y.o. female who is here today for her 73-month follow-up of our vision ACDF with corpectomy at C6 and posterior instrumented fusion cervical 5 through 7.  She states that her right shoulder pain continues to be bothersome for her, but overall her recovery is quite satisfactory.  Her incisions are well-healed.  She is ready to start weaning out of her Aspen collar, though prior to today, she has been very compliant with Aspen collar restrictions.  She also would like to discuss beginning the workup for low back pain for which she has been seeing pain management.         REVIEW OF SYSTEMS   Review of Systems   Musculoskeletal:  Positive for arthralgias, back pain, gait problem, neck pain and neck stiffness.   All other systems reviewed and are negative.    ALLERGIES     Allergies   Allergen Reactions    Erythromycin RASH     Rash on trunk    Strawberry RASH    Ascorbic Acid NAUSEA ONLY and UNKNOWN       MEDICATIONS     Current Outpatient Medications on File Prior to Visit   Medication Sig Dispense Refill    acetaminophen SR (TYLENOL 8 HOUR) 650 mg tablet Take one tablet by mouth every 8 hours as needed for Pain.      albuterol sulfate (PROAIR HFA) 90 mcg/actuation HFA aerosol inhaler Inhale two puffs by mouth into the lungs every 6 hours as needed for Wheezing or Shortness of Breath. Shake well before use.      calcium carbonate (CALCIUM 600 PO) Take 1 tablet by mouth daily.      celecoxib (CELEBREX) 200 mg capsule Take one capsule by mouth daily.      chlorhexidine gluconate (PERIDEX) 0.12 % mouthwash Swish and Spit 15 mL by mouth as directed twice daily.      CHOLEcalciferoL (vitamin D3) 50 mcg (2,000 unit) tablet Take one tablet by mouth daily.      clonazePAM (KLONOPIN) 1 mg tablet Take one tablet by mouth daily as needed.      cyanocobalamin (vitamin B-12) 1,000 mcg tablet Take one tablet by mouth daily.      cyclobenzaprine (FLEXERIL) 5 mg tablet TAKE ONE TABLET BY MOUTH THREE TIMES DAILY AS NEEDED FOR MUSCLE CRAMPS. 90 tablet 3    denosumab (PROLIA) 60 mg/mL injection Inject 1 mL under the skin every 180 days.      diclofenac (VOLTAREN) 1 % topical gel Apply four g topically to affected area four times daily as needed.      docusate (COLACE) 100 mg capsule Take one capsule by mouth twice daily. Indications: constipation 180 capsule 0    FLUoxetine (PROZAC) 40 mg capsule Take one capsule by mouth daily.      fluticasone propionate (FLONASE ALLERGY RELIEF) 50 mcg/actuation nasal spray, suspension Apply two sprays to each nostril as directed daily. 48 g 1    fluticasone-salmeterol (ADVAIR DISKUS) 250-50 mcg/dose inhalation disk Inhale one puff by mouth into the lungs twice daily.      lamoTRIgine (LAMICTAL) 100 mg tablet       lidocaine (BLUE-EMU LIDOCAINE PATCH) 4 % topical patch Apply one patch topically  to affected area daily.      lidocaine/prilocaine (EMLA) 2.5/2.5 % topical cream APPLY TOPICALLY TO AFFECTED AREA AS NEEDED. 30 g 2    lisinopriL (ZESTRIL) 40 mg tablet Take one tablet by mouth daily. for high blood pressure      Magnesium 250 mg tab Take one tablet by mouth daily.      mirtazapine (REMERON) 30 mg tablet Take one tablet by mouth at bedtime daily.      multivitamin (ONE-A-DAY) tablet Take one tablet by mouth daily.      mupirocin (BACTROBAN) 2 % topical ointment Apply  topically to affected area twice daily. 22 g 11    omeprazole DR(+) (PRILOSEC) 40 mg capsule Take one capsule by mouth twice daily.      pregabalin (LYRICA) 200 mg capsule TAKE 1 CAPSULE BY MOUTH THREE TIMES A DAY 270 capsule 0    rosuvastatin (CRESTOR) 10 mg tablet TAKE 1 TABLET BY MOUTH EVERY DAY 90 tablet 3    tiZANidine (ZANAFLEX) 4 mg tablet Take one tablet by mouth three times daily. 90 tablet 3    walker (ULTRA-LIGHT ROLLATOR) medical supply Use as directed. 1 each 0     No current facility-administered medications on file prior to visit.       PHYSICAL EXAM     Vitals:    11/22/23 1052   BP: (!) 146/82   Pulse: 61   SpO2: 98%   PainSc: Seven   Weight: 108.4 kg (239 lb)   Height: 170.2 cm (5' 7)     Oswestry Total Score:: (Patient-Rptd) 62  Pain Score: Seven  Body mass index is 37.43 kg/m?Marland Kitchen    Constitutional: Alert, NAD  Head: Atraumatic  Eyes: EOMI  Respiratory: Unlabored breathing  Cardiovascular: Regular rate  Skin: No rashes or open wounds appreciated on back.  Her incisions are well-healed.  Musculoskeletal: Strength stable  Neurologic: Sensation stable    RADIOLOGY     Cervical spine plain films obtained today.  Corpectomy mesh graft appears stable.  There is no evidence of any hardware failure.  Films demonstrate anticipated alignment and placement of instrumentation.         ASSESSMENT / PLAN   Kristen Vaughn is a 63 y.o. female with well-healing C6 corpectomy operation with posterior cervical 5 through 7 instrumentation at the 74-month postoperative point.    I have advised Kristen Vaughn that she may start weaning out of her cervical collar.  I advised her that it is not reasonable to expect to stop use of her Aspen collar completely.  She should expect needing to use the collar periodically throughout the day as her neck becomes fatigued.  However, over time, she should be able to discontinue use of the Aspen collar altogether.  We will plan to see her back in 8 weeks for an approximate 5 to 94-month follow-up of her cervical operation.      This patient was seen in conjunction with Dr. Liam Rogers.  At this point, we may proceed with the evaluation of her low back and leg pain.  Images do reveal that she has an L4-5 spondylolisthesis.  Due to this, we will obtain an MRI of her lumbar spine.  Additionally she needs to work on a little bit of weight loss.  Ideally, she would be down to about 210 pounds before we proceed with any surgical intervention of her lumbar spine.  She reports that she has had difficulty losing weight in the past, and requests a referral  to the weight management clinic, which has been established for her.  Additionally, she would like a referral to her local [facility] where she can use the restorative pool and do water walking against resistance.  This order was also created for her.

## 2023-12-06 ENCOUNTER — Encounter: Admit: 2023-12-06 | Discharge: 2023-12-06 | Payer: MEDICARE

## 2023-12-18 ENCOUNTER — Encounter: Admit: 2023-12-18 | Discharge: 2023-12-18 | Payer: MEDICARE

## 2023-12-19 ENCOUNTER — Ambulatory Visit: Admit: 2023-12-19 | Discharge: 2023-12-19 | Payer: MEDICARE

## 2023-12-19 ENCOUNTER — Encounter: Admit: 2023-12-19 | Discharge: 2023-12-19 | Payer: MEDICARE

## 2023-12-20 ENCOUNTER — Encounter: Admit: 2023-12-20 | Discharge: 2023-12-20 | Payer: MEDICARE

## 2023-12-20 NOTE — Telephone Encounter
 LOV 11/20/23.  Pts pressure set at 8-18.

## 2023-12-20 NOTE — Telephone Encounter
-----   Message from Bancroft E sent at 11/20/2023  1:07 PM CST -----  1230- need to check a download in a month for leaking, follow-up in 6 months  LOV 11/20/23

## 2024-01-02 ENCOUNTER — Encounter: Admit: 2024-01-02 | Discharge: 2024-01-02 | Payer: MEDICARE

## 2024-01-02 DIAGNOSIS — Z981 Arthrodesis status: Secondary | ICD-10-CM

## 2024-01-02 DIAGNOSIS — Z09 Encounter for follow-up examination after completed treatment for conditions other than malignant neoplasm: Secondary | ICD-10-CM

## 2024-01-03 ENCOUNTER — Encounter: Admit: 2024-01-03 | Discharge: 2024-01-03 | Payer: MEDICARE

## 2024-01-07 ENCOUNTER — Encounter: Admit: 2024-01-07 | Discharge: 2024-01-07 | Payer: MEDICARE

## 2024-01-08 ENCOUNTER — Encounter: Admit: 2024-01-08 | Discharge: 2024-01-08 | Payer: MEDICARE

## 2024-01-10 ENCOUNTER — Encounter: Admit: 2024-01-10 | Discharge: 2024-01-10 | Payer: MEDICARE

## 2024-01-10 NOTE — Telephone Encounter
 Patient LVM stating that she fell down some steps last week. She did hit her head. She said on Tuesday, she was lifting something and her legs gave out. She was unable to stand or bear weight on her legs due to pain and weakness so she called and ambulance and was taken to [facility]. They did some imaging. She wanted Dr. Maryjane Snider to take a look at it and make sure he thought everything looks okay. She has some soreness in her neck but denies increased pain, numbness, tingling, or weakness in her arms. She said she is possibly discharging to acute rehab. Per Dr. Maryjane Snider, her imaging looks okay and she can follow up with us  outpatient when she is able to. She v/u. She does have a follow up scheduled next week on 4/25. She will let us  know next week if she doesn't think she'll be able to make it to that appt and we can always reschedule it.

## 2024-01-30 ENCOUNTER — Encounter: Admit: 2024-01-30 | Discharge: 2024-01-30 | Payer: MEDICARE

## 2024-01-30 MED ORDER — TIZANIDINE 4 MG PO TAB
4 mg | ORAL_TABLET | Freq: Three times a day (TID) | ORAL | 1 refills | 30.00000 days | Status: AC
Start: 2024-01-30 — End: ?

## 2024-01-30 NOTE — Telephone Encounter
 Medication: Tizanidine 4 mg, take 1 tab TID  Last office visit:12/19/23  Next office visit:N/S  Last script: 11/04/23  Last refill: 11/04/23  Refilled per protocol.

## 2024-02-18 ENCOUNTER — Encounter: Admit: 2024-02-18 | Discharge: 2024-02-18 | Payer: MEDICARE

## 2024-03-03 ENCOUNTER — Encounter: Admit: 2024-03-03 | Discharge: 2024-03-03 | Payer: MEDICARE

## 2024-03-05 ENCOUNTER — Encounter: Admit: 2024-03-05 | Discharge: 2024-03-05 | Payer: MEDICARE

## 2024-03-05 NOTE — Telephone Encounter
 Patient LVM stating she has fallen twice in 2 days. She says her arm and knee are banged up and her foot is swollen. She is wanting to see Dr. Maryjane Snider or a neurosurgeon. RN LVM. She has a fuv appt with us  on 6/27.

## 2024-03-09 ENCOUNTER — Encounter: Admit: 2024-03-09 | Discharge: 2024-03-09 | Payer: MEDICARE

## 2024-03-09 DIAGNOSIS — Z981 Arthrodesis status: Secondary | ICD-10-CM

## 2024-03-10 ENCOUNTER — Encounter: Admit: 2024-03-10 | Discharge: 2024-03-10 | Payer: MEDICARE

## 2024-03-20 ENCOUNTER — Ambulatory Visit: Admit: 2024-03-20 | Discharge: 2024-03-20 | Payer: MEDICARE

## 2024-03-20 ENCOUNTER — Encounter: Admit: 2024-03-20 | Discharge: 2024-03-20 | Payer: MEDICARE

## 2024-03-20 ENCOUNTER — Ambulatory Visit: Admit: 2024-03-20 | Discharge: 2024-03-21 | Payer: MEDICARE

## 2024-03-24 ENCOUNTER — Encounter: Admit: 2024-03-24 | Discharge: 2024-03-24 | Payer: MEDICARE

## 2024-04-07 ENCOUNTER — Encounter: Admit: 2024-04-07 | Discharge: 2024-04-07 | Payer: MEDICARE

## 2024-04-07 NOTE — Telephone Encounter
 Pt called and left VM stating that she has not been able to wear her cpap recently and may need a new SS ordered.  Pt states that she would like to make an appt.  [facility]  Annie Aracelis Ulrey RN

## 2024-04-20 ENCOUNTER — Encounter: Admit: 2024-04-20 | Discharge: 2024-04-20 | Payer: MEDICARE

## 2024-04-30 ENCOUNTER — Encounter: Admit: 2024-04-30 | Discharge: 2024-04-30 | Payer: MEDICARE

## 2024-04-30 DIAGNOSIS — M4316 Spondylolisthesis, lumbar region: Principal | ICD-10-CM

## 2024-04-30 NOTE — Telephone Encounter
 Patient LVM asking if we can schedule her for water  therapy at the [facility] in [place]. RN spoke with patient explaining that we wouldn't be able to schedule that for her but we can provide her a referral for that like we did in February. Patient was unable to use that referral from February due to having a fall and being in the hospital. RN to place updated water  therapy and restorative pool walking referral.

## 2024-05-01 ENCOUNTER — Encounter: Admit: 2024-05-01 | Discharge: 2024-05-01 | Payer: MEDICARE

## 2024-05-04 ENCOUNTER — Encounter: Admit: 2024-05-04 | Discharge: 2024-05-04 | Payer: MEDICARE

## 2024-05-05 ENCOUNTER — Ambulatory Visit: Admit: 2024-05-05 | Discharge: 2024-05-06 | Payer: MEDICARE

## 2024-05-05 ENCOUNTER — Encounter: Admit: 2024-05-05 | Discharge: 2024-05-05 | Payer: MEDICARE

## 2024-05-08 ENCOUNTER — Encounter: Admit: 2024-05-08 | Discharge: 2024-05-08 | Payer: MEDICARE

## 2024-05-10 ENCOUNTER — Encounter: Admit: 2024-05-10 | Discharge: 2024-05-10 | Payer: MEDICARE

## 2024-05-11 ENCOUNTER — Encounter: Admit: 2024-05-11 | Discharge: 2024-05-11 | Payer: MEDICARE

## 2024-05-11 NOTE — Telephone Encounter
 An encounter has been created for documentation only (often for preparation of an upcoming appointment or for follow up on orders/imaging) and patient does not need contact RN and did not miss a phone call or appointment.     RN prepped patient information for clinic with Randall Benders APRN NP on 07/09/24  via in person appointment.     Here for 6 mo follow up  Dx:OSA, RLS  Pt known to sleep department by Dr. Blima    Last SS: 08/26/22 with [facility] 12.9  Oximetry 01/10/23  Previous cpap 8-18    Smoking a couple of cigarettes a day, on chantix    History of asthma- on Wixela      LV: 11/20/23 with Randall Benders   Meds: albuterol , flonase , advair  are all on med list     RLS  Meds:lyrica -managed by other provider     Tobacco abuse   -LDCT screening thru pcp.     Pt hospitalized from 4/14-4/30/25 at [facility] for chronic low back pain.     IFZ:Opwrjmz Deitra Pac  RN obtained download from [place]

## 2024-05-12 ENCOUNTER — Encounter: Admit: 2024-05-12 | Discharge: 2024-05-12 | Payer: MEDICARE

## 2024-05-13 ENCOUNTER — Encounter: Admit: 2024-05-13 | Discharge: 2024-05-13 | Payer: MEDICARE

## 2024-05-13 ENCOUNTER — Ambulatory Visit: Admit: 2024-05-13 | Discharge: 2024-05-13 | Payer: MEDICARE

## 2024-05-14 ENCOUNTER — Ambulatory Visit: Admit: 2024-05-14 | Discharge: 2024-05-15 | Payer: MEDICARE

## 2024-05-14 ENCOUNTER — Encounter: Admit: 2024-05-14 | Discharge: 2024-05-14 | Payer: MEDICARE

## 2024-05-14 DIAGNOSIS — G5732 Lesion of lateral popliteal nerve, left lower limb: Secondary | ICD-10-CM

## 2024-05-14 DIAGNOSIS — S22070D Wedge compression fracture of T9-T10 vertebra, subsequent encounter for fracture with routine healing: Secondary | ICD-10-CM

## 2024-05-14 DIAGNOSIS — R29898 Other symptoms and signs involving the musculoskeletal system: Principal | ICD-10-CM

## 2024-05-14 DIAGNOSIS — M47814 Spondylosis without myelopathy or radiculopathy, thoracic region: Secondary | ICD-10-CM

## 2024-05-14 NOTE — Progress Notes
 [facility]       SUBJECTIVE: Kristen Vaughn is a 63 year old female with history of C5-C7 anterior and posterior fusion who presents for follow-up.  She was last seen by nurse practitioner Kasey in March 2025.  Since that time she reports sustaining a fall and was hospitalized.  She was found to have compression fractures of her thoracic spine.  She has had persistent pain in the mid back region as well as low back region.  She has seen Dr. Christel who did not believe additional surgical intervention was warranted.  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  She has been referred to neurology at [facility], but has been told that her the next visit available is in July 2026         Review of Systems   Musculoskeletal:  Positive for back pain.     Current Medications[1]  Allergies[2]  Physical Exam  There were no vitals filed for this visit.   Telehealth Patient Reported Vitals       Row Name 05/14/24 1257                Pain Score: SEVEN        Pain Location: BACK             Oswestry Total Score:: (Patient-Rptd) 74     There is no height or weight on file to calculate BMI.  General: 63 y.o. female appears stated age, in no acute distress  HEENT: Normocephalic, atraumatic  Neck: No thyroidmegaly  Cardiovascular: Well perfused  Pulmonary: Unlabored respirations  Extremities: No cyanosis, clubbing, or edema  Skin: No lesions seen on exposed skin  Psychiatric:  Appropriate mood and affect  Musculoskeletal: No atrophy.   Neurologic: Antigravity strength in all extremities. CN II -XII grossly intact.  Alert and oriented x 3.          IMPRESSION:  1. Thoracic spondylosis    2. Weakness of both lower extremities    3. Neuropathy of left peroneal nerve    4. Compression fracture of T9 vertebra with routine healing, subsequent encounter          PLAN:    1. Lifestyle modification.  Recommend activity as tolerated.  2.  Medication.  Continue current medication regimen.  She would like to avoid opioid medications, which I think is reasonable.  3.  Therapy.  She is not on physical therapy.  She may continue with home program.  4.  Interventions.  Recommend medial branch blocks for the thoracic spine for the T8-T10 facet joints.  Will confirm the level on the day of procedure.  We discussed risk and benefits of procedure, states like to proceed.  5.  Referral.  Will for patient neurology for further workup of lower extremity weakness in the setting of abnormal electromyography.  6.  Follow-up.  Patient to follow-up after procedure.    Total time spent encounter 20 minutes clinic visit preparation, face-to-face time, counseling, documentation.  Greater than 50% visit was spent in virtual face-to-face time.            [1]   Current Outpatient Medications:     acetaminophen  SR (TYLENOL  8 HOUR) 650 mg tablet, Take one tablet by mouth every 8 hours as needed for Pain., Disp: , Rfl:     albuterol  sulfate (PROAIR  HFA) 90 mcg/actuation HFA aerosol inhaler, Inhale two puffs  by mouth into the lungs every 6 hours as needed for Wheezing or Shortness of Breath. Shake well before use., Disp: , Rfl:     buPROPion HCL SR (WELLBUTRIN SR) 150 mg tablet, 12 hr sustained-release, , Disp: , Rfl:     calcium  carbonate (CALCIUM  600 PO), Take 1 tablet by mouth daily., Disp: , Rfl:     celecoxib  (CELEBREX ) 200 mg capsule, Take one capsule by mouth daily., Disp: , Rfl:     chlorhexidine  gluconate (PERIDEX ) 0.12 % mouthwash, Swish and Spit 15 mL by mouth as directed twice daily., Disp: , Rfl:     CHOLEcalciferoL  (vitamin D3) 50 mcg (2,000 unit) tablet, Take one tablet by mouth daily., Disp: , Rfl:     clonazePAM  (KLONOPIN ) 1 mg tablet, Take one tablet by mouth daily as needed., Disp: , Rfl:     cyanocobalamin (vitamin B-12) 1,000 mcg tablet, Take one tablet by mouth daily., Disp: , Rfl: cyclobenzaprine  (FLEXERIL ) 5 mg tablet, TAKE ONE TABLET BY MOUTH THREE TIMES DAILY AS NEEDED FOR MUSCLE CRAMPS., Disp: 90 tablet, Rfl: 3    denosumab (PROLIA) 60 mg/mL injection, Inject 1 mL under the skin every 180 days., Disp: , Rfl:     diazePAM  (VALIUM ) 10 mg tablet, TAKE 1 HOUR PRIOR TO SURGERY, Disp: , Rfl:     diclofenac (VOLTAREN) 1 % topical gel, Apply four g topically to affected area four times daily as needed., Disp: , Rfl:     docusate (COLACE) 100 mg capsule, Take one capsule by mouth twice daily. Indications: constipation, Disp: 180 capsule, Rfl: 0    FLUoxetine  (PROZAC ) 40 mg capsule, Take one capsule by mouth daily., Disp: , Rfl:     fluticasone  propionate (FLONASE  ALLERGY RELIEF) 50 mcg/actuation nasal spray, suspension, Apply two sprays to each nostril as directed daily., Disp: 48 g, Rfl: 1    fluticasone -salmeterol (ADVAIR DISKUS) 250-50 mcg/dose inhalation disk, Inhale one puff by mouth into the lungs twice daily., Disp: , Rfl:     lamoTRIgine (LAMICTAL) 100 mg tablet, , Disp: , Rfl:     lidocaine  (BLUE-EMU LIDOCAINE  PATCH) 4 % topical patch, Apply one patch topically to affected area daily., Disp: , Rfl:     lidocaine /prilocaine  (EMLA ) 2.5/2.5 % topical cream, APPLY TOPICALLY TO AFFECTED AREA AS NEEDED., Disp: 30 g, Rfl: 3    lisinopriL  (ZESTRIL ) 40 mg tablet, Take one tablet by mouth daily. for high blood pressure, Disp: , Rfl:     Magnesium  250 mg tab, Take one tablet by mouth daily., Disp: , Rfl:     mirtazapine  (REMERON ) 30 mg tablet, Take one tablet by mouth at bedtime daily., Disp: , Rfl:     multivitamin (ONE-A-DAY) tablet, Take one tablet by mouth daily., Disp: , Rfl:     mupirocin  (BACTROBAN ) 2 % topical ointment, Apply  topically to affected area twice daily., Disp: 22 g, Rfl: 11    omeprazole DR(+) (PRILOSEC) 40 mg capsule, Take one capsule by mouth twice daily., Disp: , Rfl:     pregabalin  (LYRICA ) 200 mg capsule, TAKE 1 CAPSULE BY MOUTH THREE TIMES A DAY, Disp: 270 capsule, Rfl: 3    rosuvastatin  (CRESTOR ) 10 mg tablet, TAKE 1 TABLET BY MOUTH EVERY DAY, Disp: 90 tablet, Rfl: 3    tiZANidine  (ZANAFLEX ) 4 mg tablet, TAKE 1 TABLET BY MOUTH THREE TIMES A DAY, Disp: 270 tablet, Rfl: 1    topiramate (TOPAMAX PO), , Disp: , Rfl:     TRINTELLIX 20 mg tablet, Take one tablet by  mouth daily., Disp: , Rfl:     walker (ULTRA-LIGHT ROLLATOR) medical supply, Use as directed., Disp: 1 each, Rfl: 0  [2]   Allergies  Allergen Reactions    Erythromycin RASH     Rash on trunk    Strawberry RASH    Ascorbic Acid NAUSEA ONLY and UNKNOWN

## 2024-05-21 ENCOUNTER — Encounter: Admit: 2024-05-21 | Discharge: 2024-05-21 | Payer: MEDICARE

## 2024-05-21 NOTE — Telephone Encounter
 Patient called to see if we could get her scheduled with Neurology. She left a VM today reporting no one has called her to schedule. [facility] called but there was no answer VML with the schedule department to reach out to patient. Then a VML for Cree to call our office tomorrow if she has not heard from neurology by tomorrow late Morning.

## 2024-06-03 ENCOUNTER — Ambulatory Visit: Admit: 2024-06-03 | Discharge: 2024-06-03 | Payer: MEDICARE

## 2024-06-03 ENCOUNTER — Encounter: Admit: 2024-06-03 | Discharge: 2024-06-03 | Payer: MEDICARE

## 2024-06-03 DIAGNOSIS — S22070D Wedge compression fracture of T9-T10 vertebra, subsequent encounter for fracture with routine healing: Principal | ICD-10-CM

## 2024-06-03 DIAGNOSIS — M47814 Spondylosis without myelopathy or radiculopathy, thoracic region: Secondary | ICD-10-CM

## 2024-06-03 MED ORDER — BUPIVACAINE (PF) 0.5 % (5 MG/ML) IJ SOLN
3 mL | Freq: Once | INTRAMUSCULAR | 0 refills | Status: CP
Start: 2024-06-03 — End: ?

## 2024-06-03 MED ORDER — LIDOCAINE (PF) 10 MG/ML (1 %) IJ SOLN
5 mL | Freq: Once | INTRAMUSCULAR | 0 refills | Status: CP
Start: 2024-06-03 — End: ?

## 2024-06-03 NOTE — Progress Notes
 SPINE CENTER  INTERVENTIONAL PAIN PROCEDURE HISTORY AND PHYSICAL    Chief Complaint: Pain    HISTORY OF PRESENT ILLNESS:  Kristen Vaughn is a 63 y.o. year old female who presents for for interventional treatment of axial pain. Patient denies any recent fevers, chills, infection, antibiotics, coagulopathy or contra-indicated anticoagulants. Risks of the procedure were discussed including but not limited to bleeding, infection, damage to surrounding structures and reaction to medications. Patient reports understanding and has elected to proceed with the procedure.      Medial Branch Block Checklist:    Moderate to severe chronic neck pain, predominately axial, that causes functional deficit measured on pain or disability scale.  Yes *[]   No [x]    Moderate to severe chronic mid back pain, predominately axial, that causes functional deficit measured on pain or disability scale.  Yes *[x]   No []    Moderate to severe chronic low back pain, predominately axial, that causes functional deficit measured on pain or disability scale.  Yes *[]   No [x]   Pain present for minimum of 3 months with documented failure to respond to noninvasive conservative management such as physical therapy, medication     Patient has radiculopathy No  Previous radiculopathy treated by ESI injection Yes *[]   No []     NA [x]      Patient has neurogenic claudication   Yes *[]   No [x]   Has neurogenic claudication been treated   Yes *[]   No []     NA [x]              There is no non-facet pathology per clinical assessment or radiology studies that could explain the source of the patient 's pain, including but not limited to           Fracture [x]   Tumor [x]  Infection [x]   Significant deformity [x]   Purpose of Injection:  Diagnostic [x]   Therapeutic []     Previous Medial Branch Block injection was equal to or greater than 2 weeks      NA  Has this level been previously ablated greater than 2 years Yes *[]   No []     NA [x]      This level has never had an Anterior Lumbar Interbody Fusion      Yes *[]   No [x]          Oswestry:              Past Medical History:    Allergy    Anxiety and depression    Arthritis    Asthma    Brain bleed (CMS-HCC)    Breast disorder    Carpal tunnel syndrome    Cervical myelopathy (CMS-HCC)    Cervical stenosis of spine    Chest pain    Constipation    Degenerative disc disease, cervical    Dizziness    Essential hypertension    Fibromyalgia    Generalized headaches    GERD (gastroesophageal reflux disease)    Herniated disc, cervical    History of abnormal electrocardiogram    History of colon polyps    History of MRSA infection    Hyperlipemia    Inflammatory arthritis    Joint pain    Muscle disease or syndrome    Nerve injury    Obesity    Osteoporosis    Pneumonia    Pulmonary emphysema (CMS-HCC)    Pulmonary hypertension (CMS-HCC)    Seasonal allergic reaction    Sleep apnea    Spinal headache  Spinal stenosis    Varicose veins       Surgical History:   Procedure Laterality Date    HX HYSTERECTOMY  1985    UPPER GASTROINTESTINAL ENDOSCOPY  2015    COLONOSCOPY  2015    hx colon polyps x2    CARDIOVASCULAR STRESS TEST  01/2016    FINGER TRIGGER RELEASE Right 2019    CARPAL TUNNEL RELEASE Right 2019    ANTERIOR CERVICAL DECOMPRESSION AND FUSION AT CERVICAL 6-7 N/A 10/28/2018    Performed by Christel Fonda DASEN, MD at [facility] OR    ARTHRODESIS SPINE ANTERIOR INTERBODY WITH DISCECTOMY/ OSTEOPHYTECTOMY/ DECOMPRESSION - CERVICAL BELOW C2 - EACH ADDITIONAL INTERSPACE N/A 10/28/2018    Performed by Christel Fonda DASEN, MD at [facility] OR    ANTERIOR INSTRUMENTATION - 2 TO 3 VERTEBRAL SEGMENTS N/A 10/28/2018    Performed by Christel Fonda DASEN, MD at [facility] OR    REVISION ANTERIOR/POSTERIOR CERVICAL 5-7 DECOMPRESSION AND FUSION N/A 08/21/2023    Performed by Christel Fonda DASEN, MD at [facility] OR    ARTHRODESIS SPINE ANTERIOR INTERBODY WITH DISCECTOMY/ OSTEOPHYTECTOMY/ DECOMPRESSION - CERVICAL BELOW C2 - EACH ADDITIONAL INTERSPACE N/A 08/21/2023    Performed by Christel Fonda DASEN, MD at [facility] OR    INSERTION INTERBODY BIOMECHANICAL DEVICE WITH INTEGRAL ANTERIOR INSTRUMENTATION FOR DEVICE ANCHORING TO INTERVERTEBRAL DISC SPACE IN CONJUNCTION WITH INTERBODY ARTHRODESIS - EACH INTERSPACE N/A 08/21/2023    Performed by Christel Fonda DASEN, MD at [facility] OR    POSTERIOR SEGMENTAL INSTRUMENTATION - 3 TO 6 VERTEBRAL SEGMENTS N/A 08/21/2023    Performed by Christel Fonda DASEN, MD at [facility] OR    FUSION SPINE POSTERIOR - CERVICAL BELOW C2 N/A 08/21/2023    Performed by Christel Fonda DASEN, MD at [facility] OR    FUSION SPINE POSTERIOR - LUMBAR - EACH ADDITIONAL SEGMENT N/A 08/21/2023    Performed by Christel Fonda DASEN, MD at [facility] OR    ABDOMEN SURGERY      DILATION AND CURETTAGE  1980's    HX TUBAL LIGATION      LAP CHOLECYSTECTOMY      SPINE SURGERY      TILT TABLE STUDY         family history includes Alcohol liver disease in her brother; Arthritis in her father; Asthma in her sister; Back pain in her brother; COPD in her brother; Cancer in her sister, sister and another family member; Coronary Artery Disease in her father, mother, and other family members; Diabetes in her brother and mother; Hypertension in her father; Joint Pain in her sister; Neck Pain in her sister; Osteoporosis in her sister; Premature Heart Disease in an other family member; Stroke in an other family member.    Social History     Socioeconomic History    Marital status: Single   Tobacco Use    Smoking status: Former     Current packs/day: 0.00     Average packs/day: 0.3 packs/day for 60.2 years (16.1 ttl pk-yrs)     Types: Cigarettes     Quit date: 03/12/2023     Years since quitting: 1.2     Passive exposure: Current    Smokeless tobacco: Never    Tobacco comments:     variable 1-1/2 a day, usually 1 or 2 per day.   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          REVIEW OF SYSTEMS: 10 point ROS obtained and negative except for back pain      PHYSICAL EXAM:  General: 63 y.o. female appears stated age, in no acute distress  HEENT: Normocephalic, atraumatic  Neck: No thyroidmegaly  Cardiovascular: Well perfused  Pulmonary: Unlabored respirations  Extremities: No cyanosis, clubbing, or edema  Skin: No lesions seen on exposed skin  Psychiatric:  Appropriate mood and affect  Musculoskeletal: No atrophy.   Neurologic: Antigravity strength in all extremities. CN II -XII grossly intact.  Alert and oriented x 3.         IMPRESSION:    1. Compression fracture of T9 vertebra with routine healing, subsequent encounter    2. Thoracic spondylosis         PLAN:   Bilateral T7-T9 MBB, #1/2               [1]   Allergies  Allergen Reactions    Erythromycin RASH     Rash on trunk    Strawberry RASH    Ascorbic Acid NAUSEA ONLY and UNKNOWN

## 2024-06-03 NOTE — Discharge Instructions - Supplementary Instructions
 MEDIAL BRANCH BLOCK #1  DISCHARGE INSTRUCTIONS    Important information following your procedure today:     This injection is for diagnostic purposes, it is a test block. Only short-term results are expected.  Though the procedure is generally safe, and complications are rare, we do ask that you be aware of any of the following:   Any swelling, persistent redness, new bleeding, or drainage from the site of the injection.  You should not experience a severe headache.  You should not run a fever over 101? F.  New onset of sharp, severe back & or neck pain.  New onset of upper or lower extremity numbness or weakness.  New difficulty controlling bowel or bladder function after the injection.  New shortness of breath.    ** If any of these occur, please call to report this occurrence to the nurse of Dr. Katrinka Blazing at [phone]. If you are calling after 4:00 p.m. or on weekends or holidays, please call [phone] and ask to have the resident physician on call for the physician paged or go to your local emergency room.  You may experience soreness at the injection site. Ice can be applied at 20-minute intervals. Avoid application of direct heat, hot showers or hot tubs today.  Patients taking a daily blood thinner can resume their regular dose this evening.  It is important that you take all medications ordered by your pain physician. Taking medication as ordered is an important part of your pain care plan. If you cannot continue the medication plan, please notify the physician.   Remain active today. Do the activities that would normally cause you pain.  It is important for you to keep track of the results of this test on paper.  Did you get relief?  Percentage of relief?   How long did it last?    Call and report the results to your physician's nurse at the office number listed below; you may have to leave a message. Use notes that you kept on your pain diary when giving your report.              PAIN DIARY    Please report pain on 0-10 scale for each hour listed following discharge.  (0 = No pain; 5 = Moderate pain; 10 = Worst pain of your life)      TIME DAY 1 DAY 2   12AM MIDNIGHT     1AM     2AM     3AM     4AM      5AM     6AM     7AM     8AM     9AM     10AM     11AM     12PM NOON     1PM     2PM     3PM     4PM     5PM     6PM      7PM     8PM     9PM     10PM     11PM              Please contact your provider's office to report your maximal percent relief from your test blocks today and the duration of relief.    Please call and leave a message for Dr. Katrinka Blazing at [phone].  **If you did not get any relief, please also mention this as well, as this may affect the next steps  in your care.       If you are unable to keep your upcoming appointment, please notify the Spine Center scheduler at [phone] at least 24 hours in advance. If you have questions for the surgery center, call [facility] at [phone].

## 2024-06-03 NOTE — Procedures
 Attending Surgeon: Albert JONELLE Sharps, MD    Anesthesia: Local    Pre-Procedure Diagnosis:   1. Compression fracture of T9 vertebra with routine healing, subsequent encounter    2. Thoracic spondylosis        Post-Procedure Diagnosis:   1. Compression fracture of T9 vertebra with routine healing, subsequent encounter    2. Thoracic spondylosis             Medial Branch Block Cervical/Thoracic  Procedure: medial branch block    Laterality: bilateral    Location: - T6, T7 and T8      Consent:   Consent obtained: verbal and written  Consent given by: patient  Risks discussed: bleeding, bruising, infection, nerve damage, reaction to medication and weakness    Discussed with patient the purpose of the treatment/procedure, other ways of treating my condition, including no treatment/ procedure and the risks and benefits of the alternatives. Patient has decided to proceed with treatment/procedure.        Universal Protocol:  Relevant documents: relevant documents present and verified  Test results: test results available and properly labeled  Imaging studies: imaging studies available  Required items: required blood products, implants, devices, and special equipment available  Site marked: the operative site was marked  Patient identity confirmed: Patient identify confirmed verbally with patient.        Time out: Immediately prior to procedure a time out was called to verify the correct patient, procedure, equipment, support staff and site/side marked as required      Procedures Details:   Indications: diagnostic evaluation   Prep: chlorhexidine   Patient position: prone  Estimated Blood Loss: minimal  Specimens: none  Number of Levels: 2  Guidance: fluoroscopy  Needle and Epidural Catheter: quincke  Needle size: 25 G  Injection procedure: Incremental injection and Negative aspiration for blood  Patient tolerance: Patient tolerated the procedure well with no immediate complications. Pressure was applied, and hemostasis was accomplished.  Comments: DESCRIPTION OF PROCEDURE:  The procedure risks and benefits were explained.  Informed consent was obtained from the patient.  The patient was placed in the left lateral recumbent position on the fluoroscopy table.  Blood pressure cuff and oxygen saturation monitors were attached, and the patient was monitored throughout the entire procedure.  The cervical vertebral levels was identified with the use of fluoroscopy in the AP view.  The skin was prepped using Chlorhexadine and draped in aseptic fashion.  The C-arm was put in view to visualize the facetal column of the bilateral T7-T9 vertebral bodies.  The skin and subcutaneous tissue were anesthetized using 2 mL of 1 percent lidocaine  with a 27-gauge needle.  A 3-1/2-inch, 25-gauge spinal needle was advanced parallel to the x-ray beam towards the anatomic position of the medial branches.  The needle was advanced slowly until the tip of the needle made contact with bone.  After negative aspiration, 0.5 mL of local anesthetic below was injected at each level.  The needles were then removed.  There was no sensory or motor deficit of the extremity noted.  After the procedure, the patient's blood pressure, heart rate, oxygen saturation, and VAS were recorded in the chart.     Complications, there were no complications.  The patient tolerated the procedure well and was brought to recovery room for observation in stable condition and discharged with written discharge instructions.     Plan of care:  If patient has positive response, the patient is to follow up for second medial branch  block.  The patient was advised to contact the interventional spine center for any of the following:  Fever, chills, or night sweats.  New onset of severe sharp pain.  Any new upper or lower extremity weakness or numbness.  Any questions regarding the procedure.     If unable to contact the interventional spine center, the patient was instructed to go to the local emergency room.                   Estimated blood loss: none or minimal  Specimens: none  Patient tolerated the procedure well with no immediate complications. Pressure was applied, and hemostasis was accomplished.

## 2024-06-05 ENCOUNTER — Encounter: Admit: 2024-06-05 | Discharge: 2024-06-05 | Payer: MEDICARE

## 2024-06-08 ENCOUNTER — Encounter: Admit: 2024-06-08 | Discharge: 2024-06-08 | Payer: MEDICARE

## 2024-06-09 ENCOUNTER — Encounter: Admit: 2024-06-09 | Discharge: 2024-06-09 | Payer: MEDICARE

## 2024-06-12 ENCOUNTER — Encounter: Admit: 2024-06-12 | Discharge: 2024-06-12 | Payer: MEDICARE

## 2024-06-19 ENCOUNTER — Encounter: Admit: 2024-06-19 | Discharge: 2024-06-19 | Payer: MEDICARE

## 2024-06-19 ENCOUNTER — Ambulatory Visit: Admit: 2024-06-19 | Discharge: 2024-06-20 | Payer: MEDICARE

## 2024-06-19 VITALS — BP 114/79 | HR 91 | Ht 67.0 in | Wt 214.8 lb

## 2024-06-19 DIAGNOSIS — M47814 Spondylosis without myelopathy or radiculopathy, thoracic region: Principal | ICD-10-CM

## 2024-06-19 DIAGNOSIS — M546 Pain in thoracic spine: Secondary | ICD-10-CM

## 2024-06-19 NOTE — Progress Notes
 SPINE CENTER CLINIC NOTE       SUBJECTIVE:   Kristen Vaughn is a 63 y.o.-year-old female with history of C5-C7 anterior and posterior fusion with Dr. Christel who presents for follow-up after bilateral T7-T8 and T8-T9 medial branch block on 06/03/2024.  Patient reports she was confused about reporting her relief after the procedure.  She reports she had numbness and resolution of the pain in her thoracic spine after the first medial branch block.  She reports this lasted for 3 to 4 hours.  She reports 50 to 60% relief.  Patient reports she would be happy with pain relief in that area if that is the result she had with a ablation.  She would be interested in trying the second medial branch block.  She has not changed her medications.  VAS pain score is an 8 out of 10 today.  Pain is axial in nature in the thoracic spine.  She denies any radiation into the chest.  She has difficulty performing activities of daily living including participate in her provider directed home exercises due to pain in her thoracic spine.  Denies any loss of control of bowel or bladder.        Review of Systems  Current Medications[1]  Allergies[2]  Physical Exam  Vitals:    06/19/24 1259   BP: 114/79   BP Source: Arm, Right Upper   Pulse: 91   SpO2: 97%   PainSc: Eight   Weight: 97.4 kg (214 lb 12.8 oz)   Height: 170.2 cm (5' 7)      Telehealth Patient Reported Vitals       Row Name 06/19/24 1259                Pain Score EIGHT        Patient Position Sitting        BP Source Arm, Right Upper                Pain Score: Eight  Body mass index is 33.64 kg/m?SABRA  General: 64 y.o. female appears stated age, in no acute distress  HEENT: Normocephalic, atraumatic  Neck: No thyroidmegaly  Cardiovascular: Well perfused  Pulmonary: Unlabored respirations  Extremities: No cyanosis, clubbing, or edema  Skin: No lesions seen on exposed skin  Psychiatric:  Appropriate mood and affect  Musculoskeletal: No atrophy.   Neurologic: Antigravity strength in all extremities. CN II -XII grossly intact.  Alert and oriented x 3.        IMPRESSION:  1. Thoracic spondylosis    2. Compression fracture of T9 vertebra with routine healing, subsequent encounter    3. Pain of thoracic facet joint      PLAN:    1.  Lifestyle modifications.  Recommend activity as tolerated.  Avoid provocative maneuvers.  Keep spine in neutral position.  2.  Medications.  No changes indicated at this time.  Continue medications as previously prescribed.  3.  Therapy.  Continue home exercises.  4.  Imaging.  None indicated at this time.  5.  Interventions.  I would recommend proceeding with the second thoracic medial branch block.  I have had a lengthy discussion with her regarding how long of relief to expect and what location.  She will keep a pain diary.  If she does not have benefit, I would recommend follow-up with Dr. Claudene.  Risks of the procedure were discussed including pain, bleeding, infection, damage nearby structures.  Patient has elected to proceed.  6.  Follow-up.  Patient to follow-up for injection.    Total Time Today was 23 minutes in the following activities: Preparing to see the patient, Obtaining and/or reviewing separately obtained history, Performing a medically appropriate examination and/or evaluation, Counseling and educating the patient/family/caregiver, Ordering medications, tests, or procedures and Documenting clinical information in the electronic or other health record.              [1]   Current Outpatient Medications:     acetaminophen  SR (TYLENOL  8 HOUR) 650 mg tablet, Take one tablet by mouth every 8 hours as needed for Pain., Disp: , Rfl:     albuterol  sulfate (PROAIR  HFA) 90 mcg/actuation HFA aerosol inhaler, Inhale two puffs by mouth into the lungs every 6 hours as needed for Wheezing or Shortness of Breath. Shake well before use., Disp: , Rfl:     buPROPion HCL SR (WELLBUTRIN SR) 150 mg tablet, 12 hr sustained-release, , Disp: , Rfl:     calcium  carbonate (CALCIUM  600 PO), Take 1 tablet by mouth daily., Disp: , Rfl:     celecoxib  (CELEBREX ) 200 mg capsule, Take one capsule by mouth daily., Disp: , Rfl:     chlorhexidine  gluconate (PERIDEX ) 0.12 % mouthwash, Swish and Spit 15 mL by mouth as directed twice daily., Disp: , Rfl:     CHOLEcalciferoL  (vitamin D3) 50 mcg (2,000 unit) tablet, Take one tablet by mouth daily., Disp: , Rfl:     clonazePAM  (KLONOPIN ) 1 mg tablet, Take one tablet by mouth daily as needed., Disp: , Rfl:     denosumab (PROLIA) 60 mg/mL injection, Inject 1 mL under the skin every 180 days., Disp: , Rfl:     diazePAM  (VALIUM ) 10 mg tablet, TAKE 1 HOUR PRIOR TO SURGERY, Disp: , Rfl:     docusate (COLACE) 100 mg capsule, Take one capsule by mouth twice daily. Indications: constipation, Disp: 180 capsule, Rfl: 0    FLUoxetine  (PROZAC ) 40 mg capsule, Take one capsule by mouth daily., Disp: , Rfl:     fluticasone  propionate (FLONASE  ALLERGY RELIEF) 50 mcg/actuation nasal spray, suspension, Apply two sprays to each nostril as directed daily., Disp: 48 g, Rfl: 1    fluticasone -salmeterol (ADVAIR DISKUS) 250-50 mcg/dose inhalation disk, Inhale one puff by mouth into the lungs twice daily., Disp: , Rfl:     lidocaine  (BLUE-EMU LIDOCAINE  PATCH) 4 % topical patch, Apply one patch topically to affected area daily., Disp: , Rfl:     lidocaine /prilocaine  (EMLA ) 2.5/2.5 % topical cream, APPLY TOPICALLY TO AFFECTED AREA AS NEEDED., Disp: 30 g, Rfl: 3    lisinopriL  (ZESTRIL ) 40 mg tablet, Take one tablet by mouth daily. for high blood pressure, Disp: , Rfl:     Magnesium  250 mg tab, Take one tablet by mouth daily., Disp: , Rfl:     mirtazapine  (REMERON ) 30 mg tablet, Take one tablet by mouth at bedtime daily., Disp: , Rfl:     multivitamin (ONE-A-DAY) tablet, Take one tablet by mouth daily., Disp: , Rfl:     omeprazole DR(+) (PRILOSEC) 40 mg capsule, Take one capsule by mouth twice daily., Disp: , Rfl:     pregabalin  (LYRICA ) 200 mg capsule, TAKE 1 CAPSULE BY MOUTH THREE TIMES A DAY, Disp: 270 capsule, Rfl: 3    rosuvastatin  (CRESTOR ) 10 mg tablet, TAKE 1 TABLET BY MOUTH EVERY DAY, Disp: 90 tablet, Rfl: 3    tiZANidine  (ZANAFLEX ) 4 mg tablet, TAKE 1 TABLET BY MOUTH THREE TIMES A DAY, Disp: 270 tablet, Rfl: 1  topiramate (TOPAMAX PO), , Disp: , Rfl:     TRINTELLIX 20 mg tablet, Take one tablet by mouth daily., Disp: , Rfl:     walker (ULTRA-LIGHT ROLLATOR) medical supply, Use as directed., Disp: 1 each, Rfl: 0  [2]   Allergies  Allergen Reactions    Erythromycin RASH     Rash on trunk    Strawberry RASH    Ascorbic Acid NAUSEA ONLY and UNKNOWN

## 2024-06-20 DIAGNOSIS — S22070D Wedge compression fracture of T9-T10 vertebra, subsequent encounter for fracture with routine healing: Secondary | ICD-10-CM

## 2024-06-24 ENCOUNTER — Ambulatory Visit: Admit: 2024-06-24 | Discharge: 2024-06-24 | Payer: MEDICARE

## 2024-06-24 ENCOUNTER — Encounter: Admit: 2024-06-24 | Discharge: 2024-06-24 | Payer: MEDICARE

## 2024-06-24 DIAGNOSIS — S22070D Wedge compression fracture of T9-T10 vertebra, subsequent encounter for fracture with routine healing: Secondary | ICD-10-CM

## 2024-06-24 DIAGNOSIS — M47814 Spondylosis without myelopathy or radiculopathy, thoracic region: Principal | ICD-10-CM

## 2024-06-24 MED ORDER — LIDOCAINE (PF) 20 MG/ML (2 %) IJ SOLN
3 mL | Freq: Once | INTRAMUSCULAR | 0 refills | Status: CP
Start: 2024-06-24 — End: ?

## 2024-06-24 MED ORDER — LIDOCAINE (PF) 10 MG/ML (1 %) IJ SOLN
5 mL | Freq: Once | INTRAMUSCULAR | 0 refills | Status: CP
Start: 2024-06-24 — End: ?

## 2024-06-24 NOTE — Procedures
 Attending Surgeon: Albert JONELLE Sharps, MD    Anesthesia: Local    Pre-Procedure Diagnosis:   1. Thoracic spondylosis    2. Compression fracture of T9 vertebra with routine healing, subsequent encounter        Post-Procedure Diagnosis:   1. Thoracic spondylosis    2. Compression fracture of T9 vertebra with routine healing, subsequent encounter             Medial Branch Block Cervical/Thoracic  Procedure: medial branch block    Laterality: bilateral    Location: - T6, T7 and T8      Consent:   Consent obtained: verbal and written  Consent given by: patient  Risks discussed: bleeding, bruising, infection, nerve damage, reaction to medication and weakness    Discussed with patient the purpose of the treatment/procedure, other ways of treating my condition, including no treatment/ procedure and the risks and benefits of the alternatives. Patient has decided to proceed with treatment/procedure.        Universal Protocol:  Relevant documents: relevant documents present and verified  Test results: test results available and properly labeled  Imaging studies: imaging studies available  Required items: required blood products, implants, devices, and special equipment available  Site marked: the operative site was marked  Patient identity confirmed: Patient identify confirmed verbally with patient.        Time out: Immediately prior to procedure a time out was called to verify the correct patient, procedure, equipment, support staff and site/side marked as required      Procedures Details:   Indications: diagnostic evaluation   Prep: chlorhexidine   Patient position: prone  Estimated Blood Loss: minimal  Specimens: none  Number of Levels: 2  Guidance: fluoroscopy  Needle and Epidural Catheter: quincke  Needle size: 25 G  Injection procedure: Incremental injection and Negative aspiration for blood  Patient tolerance: Patient tolerated the procedure well with no immediate complications. Pressure was applied, and hemostasis was accomplished.  Comments: DESCRIPTION OF PROCEDURE:  The procedure risks and benefits were explained.  Informed consent was obtained from the patient.  The patient was placed in the left lateral recumbent position on the fluoroscopy table.  Blood pressure cuff and oxygen saturation monitors were attached, and the patient was monitored throughout the entire procedure.  The cervical vertebral levels was identified with the use of fluoroscopy in the AP view.  The skin was prepped using Chlorhexadine and draped in aseptic fashion.  The C-arm was put in view to visualize the facetal column of the bilateral T7-T9 vertebral bodies.  The skin and subcutaneous tissue were anesthetized using 2 mL of 1 percent lidocaine  with a 27-gauge needle.  A 3-1/2-inch, 25-gauge spinal needle was advanced parallel to the x-ray beam towards the anatomic position of the medial branches.  The needle was advanced slowly until the tip of the needle made contact with bone.  After negative aspiration, 0.5 mL of local anesthetic below was injected at each level.  The needles were then removed.  There was no sensory or motor deficit of the extremity noted.  After the procedure, the patient's blood pressure, heart rate, oxygen saturation, and VAS were recorded in the chart.     Complications, there were no complications.  The patient tolerated the procedure well and was brought to recovery room for observation in stable condition and discharged with written discharge instructions.     Plan of care:  If patient has positive response, the patient is to follow up for radiofrequency ablation.  The patient was advised to contact the interventional spine center for any of the following:  Fever, chills, or night sweats.  New onset of severe sharp pain.  Any new upper or lower extremity weakness or numbness.  Any questions regarding the procedure.     If unable to contact the interventional spine center, the patient was instructed to go to the local emergency room.                 Administrations This Visit       lidocaine  (PF) 20 mg/mL (2 %) injection 60 mg       Admin Date  06/24/2024 Action  Given Dose  60 mg Route  Injection Documented By  Maranda Remak, RN              lidocaine  PF 1% (10 mg/mL) injection 5 mL       Admin Date  06/24/2024 Action  Given Dose  5 mL Route  Injection Documented By  Maranda Remak, RN                  Estimated blood loss: none or minimal  Specimens: none  Patient tolerated the procedure well with no immediate complications. Pressure was applied, and hemostasis was accomplished.

## 2024-06-24 NOTE — Discharge Instructions - Supplementary Instructions
 MEDIAL BRANCH BLOCK #2  DISCHARGE INSTRUCTIONS    Important information following your procedure today:     This injection is for diagnostic purposes, it is a test block. Only short-term results are expected.  Though the procedure is generally safe, and complications are rare, we do ask that you be aware of any of the following:   Any swelling, persistent redness, new bleeding, or drainage from the site of the injection.  You should not experience a severe headache.  You should not run a fever over 101? F.  New onset of sharp, severe back & or neck pain.  New onset of upper or lower extremity numbness or weakness.  New difficulty controlling bowel or bladder function after the injection.  New shortness of breath.    ** If any of these occur, please call to report this occurrence to the nurse of Dr. Katrinka Blazing at [phone]. If you are calling after 4:00 p.m. or on weekends or holidays, please call [phone] and ask to have the resident physician on call for the physician paged or go to your local emergency room.  You may experience soreness at the injection site. Ice can be applied at 20-minute intervals. Avoid application of direct heat, hot showers or hot tubs today.  Patients taking a daily blood thinner can resume their regular dose this evening.  It is important that you take all medications ordered by your pain physician. Taking medication as ordered is an important part of your pain care plan. If you cannot continue the medication plan, please notify the physician.   Remain active today. Do the activities that would normally cause you pain.  It is important for you to keep track of the results of this test on paper.  Did you get relief?  Percentage of relief?   How long did it last?    Call and report the results to your physician's nurse at the office number listed below; you may have to leave a message. Use notes that you kept on your pain diary when giving your report.              PAIN DIARY    Please report pain on 0-10 scale for each hour listed following discharge.  (0 = No pain; 5 = Moderate pain; 10 = Worst pain of your life)      TIME DAY 1 DAY 2   12AM MIDNIGHT     1AM     2AM     3AM     4AM      5AM     6AM     7AM     8AM     9AM     10AM     11AM     12PM NOON     1PM     2PM     3PM     4PM     5PM     6PM      7PM     8PM     9PM     10PM     11PM               Please contact your provider's office to report your maximal percent relief from your test blocks today and the duration of relief.    Please call and leave a message for Dr. Katrinka Blazing at [phone].  **If you did not get any relief, please also mention this as well, as this may affect the next  steps in your care.       If you are unable to keep your upcoming appointment, please notify the [facility]ine Center scheduler at [phone] at least 24 hours in advance. If you have questions for the surgery center, call [facility] at [phone].    *Next Steps*  For your upcoming RFA (radiofrequency ablation) procedure, please keep in mind the following key points:  You will most likely be given IV sedation to help keep you calm and relaxed during the procedure, as well as assist with pain control. This will be evaluated on an individual basis prior to the procedure and decided on by the physician to make sure you are safe based on medical history. If IV sedation is not a safe option based on medical history, a medication may be prescribed to take by mouth to help keep you calm and relaxed during the procedure.  On the day of your scheduled ablation, you should have nothing by mouth for at least 6 hours before your scheduled procedure arrival time.  After your sedation, you will be monitored for at least 30 minutes to ensure it is safe for you to go home.  You must have a responsible adult with you upon discharge from the procedure to transport you home and monitor you for the following 12 hours.  You will receive a phone call the day before your scheduled procedure with your arrival time.  If you arrive 15 minutes or more past your scheduled procedure time, your appointment is subject to be rescheduled.

## 2024-06-26 ENCOUNTER — Encounter: Admit: 2024-06-26 | Discharge: 2024-06-26 | Payer: MEDICARE

## 2024-06-26 DIAGNOSIS — S22070D Wedge compression fracture of T9-T10 vertebra, subsequent encounter for fracture with routine healing: Principal | ICD-10-CM

## 2024-06-26 DIAGNOSIS — M47814 Spondylosis without myelopathy or radiculopathy, thoracic region: Secondary | ICD-10-CM

## 2024-06-26 NOTE — Telephone Encounter
 Patient called, reported MBB#2 pain relief 60% for 1 hour. BILAT RFA T7-T8, T8-T9 scheduled for 07/15/24 at 9:45am [facility].

## 2024-07-03 ENCOUNTER — Encounter: Admit: 2024-07-03 | Discharge: 2024-07-03 | Payer: MEDICARE

## 2024-07-06 ENCOUNTER — Encounter: Admit: 2024-07-06 | Discharge: 2024-07-06 | Payer: MEDICARE

## 2024-07-06 ENCOUNTER — Ambulatory Visit: Admit: 2024-07-06 | Discharge: 2024-07-07 | Payer: MEDICARE

## 2024-07-06 VITALS — BP 100/67 | HR 64 | Temp 97.90000°F | Ht 67.0 in | Wt 209.3 lb

## 2024-07-06 DIAGNOSIS — N62 Hypertrophy of breast: Principal | ICD-10-CM

## 2024-07-06 NOTE — Progress Notes
 Date of Service: 07/06/2024    Subjective:             Kristen Vaughn is a 63 y.o. female.    History of Present Illness  Patient presents to clinic to discuss symptomatic macromastia. Patient currently wears a 42E cup. Patient would like to be more proportional to body habitus and feels she would be more comfortable at 42B/C. Patient endorses postural changes, hypoesthesia of upper extremities, neck and upper/mid/lower back pain. Patient regularly has indentations of shoulders from brasserie straps. Patient has previously completed PT for her neck and back. Patient takes OTC medication - NSAIDs, lidocaine  patches and topical lidocaine  for aches and pains. Patient recently underwent medial branch block of her thoracic spine that has improved her neck pain.    Allergies   Allergen Reactions    Erythromycin RASH     Rash on trunk    Strawberry RASH    Ascorbic Acid NAUSEA ONLY and UNKNOWN     PMHx: Brain bleed, cervical disc herniation, polyneuropathy, RLS, emphysema, HLD, asthma, arthritis, HTN, fibromyalgia, headaches, GERD, osteoporosis, and depression.  Surgical Hx: Cervical fusion, right trigger finger release, carpal tunnel release, EGD/Colonoscopy, total hysterectomy, lap cholecystectomy, D&C.  Social Hx: Patient is a current smoker. She denies drinking alcohol. She has had three pregnancies, two vaginal deliveries and one miscarriage.    Family Hx of breast cancer: No known history.    Patient will be getting an updated mammogram tomorrow.      Review of Systems   Musculoskeletal:  Positive for back pain and neck pain.   All other systems reviewed and are negative.        Objective:          acetaminophen  SR (TYLENOL  8 HOUR) 650 mg tablet Take one tablet by mouth every 8 hours as needed for Pain.    albuterol  sulfate (PROAIR  HFA) 90 mcg/actuation HFA aerosol inhaler Inhale two puffs by mouth into the lungs every 6 hours as needed for Wheezing or Shortness of Breath. Shake well before use.    buPROPion HCL SR (WELLBUTRIN SR) 150 mg tablet, 12 hr sustained-release     calcium  carbonate (CALCIUM  600 PO) Take 1 tablet by mouth daily.    celecoxib  (CELEBREX ) 200 mg capsule Take one capsule by mouth daily.    chlorhexidine  gluconate (PERIDEX ) 0.12 % mouthwash Swish and Spit 15 mL by mouth as directed twice daily.    CHOLEcalciferoL  (vitamin D3) 50 mcg (2,000 unit) tablet Take one tablet by mouth daily.    clonazePAM  (KLONOPIN ) 1 mg tablet Take one tablet by mouth daily as needed.    denosumab (PROLIA) 60 mg/mL injection Inject 1 mL under the skin every 180 days.    duloxetine  DR (CYMBALTA ) 20 mg capsule Take one capsule by mouth daily.    fluticasone  propionate (FLONASE  ALLERGY RELIEF) 50 mcg/actuation nasal spray, suspension Apply two sprays to each nostril as directed daily.    fluticasone -salmeterol (ADVAIR DISKUS) 250-50 mcg/dose inhalation disk Inhale one puff by mouth into the lungs twice daily.    lidocaine  (BLUE-EMU LIDOCAINE  PATCH) 4 % topical patch Apply one patch topically to affected area daily.    lidocaine /prilocaine  (EMLA ) 2.5/2.5 % topical cream APPLY TOPICALLY TO AFFECTED AREA AS NEEDED.    lisinopriL  (ZESTRIL ) 40 mg tablet Take one tablet by mouth daily. for high blood pressure    Magnesium  250 mg tab Take one tablet by mouth daily.    mirtazapine  (REMERON ) 30 mg tablet Take one tablet by  mouth at bedtime daily.    multivitamin (ONE-A-DAY) tablet Take one tablet by mouth daily.    omeprazole DR(+) (PRILOSEC) 40 mg capsule Take one capsule by mouth twice daily.    pregabalin  (LYRICA ) 200 mg capsule TAKE 1 CAPSULE BY MOUTH THREE TIMES A DAY    rosuvastatin  (CRESTOR ) 10 mg tablet TAKE 1 TABLET BY MOUTH EVERY DAY    tiZANidine  (ZANAFLEX ) 4 mg tablet TAKE 1 TABLET BY MOUTH THREE TIMES A DAY    topiramate (TOPAMAX PO)     walker (ULTRA-LIGHT ROLLATOR) medical supply Use as directed.     Vitals:    07/06/24 1504   BP: 100/67   Pulse: 64   Temp: 36.6 ?C (97.9 ?F)   TempSrc: Temporal   PainSc: Seven   Weight: 94.9 kg (209 lb 4.8 oz)   Height: 170.2 cm (5' 7)     Body mass index is 32.78 kg/m?SABRA     Physical Exam  Vitals reviewed.   Constitutional:       Appearance: Normal appearance. She is well-groomed.   HENT:      Head: Normocephalic.   Pulmonary:      Effort: Pulmonary effort is normal.   Chest:       Skin:     General: Skin is warm and dry.   Neurological:      Mental Status: She is alert.   Psychiatric:         Mood and Affect: Mood normal.         Behavior: Behavior normal. Behavior is cooperative.         Thought Content: Thought content normal.         Judgment: Judgment normal.          Assessment and Plan:  63 y/o female patient with symptomatic macromastia.      - Discussed with the patient that based on her current BSA of 2.12 we will need to remove 780g/side. We discussed that this is larger than her current breast volume. Patient notes she has a goal weight of 180lbs and to be under 200lbs. We discussed that the schnur scale is directly related to her height and weight. As she continues to lose weight, the amount for resection also decreases however, her breast volume may also decrease with weight loss. She currently does not have enough breast tissue for the current schnur scale amount.     - I went through the breast reduction information packet discussing breast cup size is not standard and I cannot predict cup size after surgery.  Insurance requires a specific amount of breast weight (in grams) be removed base on body surface area. Additionally, they prefer failed conservative treatments including but not limited to: weight loss, treatments by PCP, prescription and OTC meds, PT, chiropractor, personal training, massage therapy, etc.    - Incisions- discussed and showed examples of incisions for breast reduction which are typically wise pattern or anchor incisions that can sometimes extend into flanks and sometime meet in the center of chest to correct synmastia. Discussed it is normal to have some breakdown/scabs/wounds on incisions where incisions meet- at I circled those areas on the packet drawing of incisions.    - Activity- We discussed activity restrictions after surgery including 6 weeks of not lifting >10lbs and no strenuous.   - Bras- will need to wear a non-under wire bra, preferably with front closure day/night except in shower for at least 6 weeks, with padding along IMF, will need to  cont to wear non-under wire bra for 2mo to allow healing.  Swelling persists for approximately 2mo.  Thus recommend waiting 3 mo before bra shopping and getting sized.  Will be able to wear any bra as comfortable after that time.    -I discussed the risks, benefits, alternatives, and limitations of surgery. We spoke about bleeding, infection, incision separation, fat necrosis, loss of her nipple and areola, altered coloration of her nipple and areola, altered sensation of her nipple and areola, seroma formation, scarring, asymmetry, unacceptable cosmesis with potential boxy shape. She needs support after the surgery as I do not want her to lift any heavy objects or do strenuous activity for 4-6 weeks. We discussed that her impaired mobility may cause pain postoperatively as she currently ambulates with a cane or walker.      - I discussed with her the process of submitting to insurance for approval   - I informed her to make sure her insurance co doesn't have a clause stating breast reduction is not a covered benefit.   - discussed that if prior authorization is denied, it might be mean that it is not a covered benefit, that more conservative treatments might be required or that not enough tissue needs to be removed.    - sometimes suggest that her PCP prescribe  PT for neck, back, shoulder pain related to her macromastia, see other practitioners like chiropractor and/or additional topical treatments for intertrigo.  If these do not help her symptoms, we can re-submit if/when it fails.   - can also give her a quote for cosmetic pricing if not covered after all attempts.     - needs current MMG prior to surgery (ordered by PCP) and any other suggested imaging or work up completed prior to preop, will need early MMG if surgery is done w/I 2mo of due date for yearly mammagrams.     Need to have copy of results at the time of their preop appt. Then recommend post op 6mo as a baseline for future surgeries.  If not done, will be canceled/rescheduled.    - all of ILINE BUCHINGER questions were answered to their satisfaction and she expressed understanding.     -Discussed with patient that she must stop smoking prior to any elective procedures as it puts her at a much higher risk for complications and wound healing issues. Patient notes that she will need a revision of her cervical fusion once she stops smoking.     I encouraged patient to reach out when she stops smoking, has cervical fusion revision and meets her goal weight.     The diagnosis codes I would use include:  N62 Symptomatic macromastia  M54.2 Neck Pain  M54.9 Upper Back Pain  M25.519Shoulder Pain  L30.4 Intertrigo, infections under the breast    The CPT codes for this surgery are 80681, 19318.50     Andrena JONELLE Backer, APRN-NP

## 2024-07-09 ENCOUNTER — Encounter: Admit: 2024-07-09 | Discharge: 2024-07-09 | Payer: MEDICARE

## 2024-07-15 ENCOUNTER — Encounter: Admit: 2024-07-15 | Discharge: 2024-07-15 | Payer: MEDICARE

## 2024-07-15 ENCOUNTER — Ambulatory Visit: Admit: 2024-07-15 | Discharge: 2024-07-15 | Payer: MEDICARE

## 2024-07-15 DIAGNOSIS — M47814 Spondylosis without myelopathy or radiculopathy, thoracic region: Principal | ICD-10-CM

## 2024-07-15 MED ORDER — MIDAZOLAM 1 MG/ML IJ SOLN
1 mg | INTRAVENOUS | 0 refills | Status: AC | PRN
Start: 2024-07-15 — End: ?

## 2024-07-15 MED ORDER — FENTANYL CITRATE (PF) 50 MCG/ML IJ SOLN
50 ug | INTRAVENOUS | 0 refills | Status: AC | PRN
Start: 2024-07-15 — End: ?

## 2024-07-15 MED ORDER — HYDROCODONE-ACETAMINOPHEN 5-325 MG PO TAB
1 | ORAL_TABLET | Freq: Two times a day (BID) | ORAL | 0 refills | 30.00000 days | Status: AC | PRN
Start: 2024-07-15 — End: ?

## 2024-07-15 MED ORDER — LIDOCAINE (PF) 10 MG/ML (1 %) IJ SOLN
10 mL | Freq: Once | INTRAMUSCULAR | 0 refills | Status: CP
Start: 2024-07-15 — End: ?

## 2024-07-15 MED ORDER — BUPIVACAINE (PF) 0.25 % (2.5 MG/ML) IJ SOLN
10 mL | Freq: Once | INTRAMUSCULAR | 0 refills | Status: CP
Start: 2024-07-15 — End: ?

## 2024-07-15 NOTE — Progress Notes
 SPINE CENTER  INTERVENTIONAL PAIN PROCEDURE HISTORY AND PHYSICAL    Chief Complaint: Pain    HISTORY OF PRESENT ILLNESS:  Kristen Vaughn is a 63 y.o. year old female who presents for for interventional treatment of axial pain. Patient denies any recent fevers, chills, infection, antibiotics, coagulopathy or contra-indicated anticoagulants. Risks of the procedure were discussed including but not limited to bleeding, infection, damage to surrounding structures and reaction to medications. Patient reports understanding and has elected to proceed with the procedure.        Past Medical History:    Allergy    Anxiety and depression    Arthritis    Asthma    Brain bleed (CMS-HCC)    Breast disorder    Carpal tunnel syndrome    Cervical myelopathy (CMS-HCC)    Cervical stenosis of spine    Chest pain    Constipation    Degenerative disc disease, cervical    Dizziness    Essential hypertension    Fibromyalgia    Generalized headaches    GERD (gastroesophageal reflux disease)    Herniated disc, cervical    History of abnormal electrocardiogram    History of colon polyps    History of MRSA infection    Hyperlipemia    Inflammatory arthritis    Joint pain    Muscle disease or syndrome    Nerve injury    Obesity    Osteoporosis    Pneumonia    Pulmonary emphysema (CMS-HCC)    Pulmonary hypertension (CMS-HCC)    Seasonal allergic reaction    Sleep apnea    Spinal headache    Spinal stenosis    Varicose veins       Surgical History:   Procedure Laterality Date    HX HYSTERECTOMY  1985    UPPER GASTROINTESTINAL ENDOSCOPY  2015    COLONOSCOPY  2015    hx colon polyps x2    CARDIOVASCULAR STRESS TEST  01/2016    FINGER TRIGGER RELEASE Right 2019    CARPAL TUNNEL RELEASE Right 2019    ANTERIOR CERVICAL DECOMPRESSION AND FUSION AT CERVICAL 6-7 N/A 10/28/2018    Performed by Christel Fonda DASEN, MD at [facility] OR    ARTHRODESIS SPINE ANTERIOR INTERBODY WITH DISCECTOMY/ OSTEOPHYTECTOMY/ DECOMPRESSION - CERVICAL BELOW C2 - EACH ADDITIONAL INTERSPACE N/A 10/28/2018    Performed by Christel Fonda DASEN, MD at [facility] OR    ANTERIOR INSTRUMENTATION - 2 TO 3 VERTEBRAL SEGMENTS N/A 10/28/2018    Performed by Christel Fonda DASEN, MD at [facility] OR    REVISION ANTERIOR/POSTERIOR CERVICAL 5-7 DECOMPRESSION AND FUSION N/A 08/21/2023    Performed by Christel Fonda DASEN, MD at [facility] OR    ARTHRODESIS SPINE ANTERIOR INTERBODY WITH DISCECTOMY/ OSTEOPHYTECTOMY/ DECOMPRESSION - CERVICAL BELOW C2 - EACH ADDITIONAL INTERSPACE N/A 08/21/2023    Performed by Christel Fonda DASEN, MD at [facility] OR    INSERTION INTERBODY BIOMECHANICAL DEVICE WITH INTEGRAL ANTERIOR INSTRUMENTATION FOR DEVICE ANCHORING TO INTERVERTEBRAL DISC SPACE IN CONJUNCTION WITH INTERBODY ARTHRODESIS - EACH INTERSPACE N/A 08/21/2023    Performed by Christel Fonda DASEN, MD at [facility] OR    POSTERIOR SEGMENTAL INSTRUMENTATION - 3 TO 6 VERTEBRAL SEGMENTS N/A 08/21/2023    Performed by Christel Fonda DASEN, MD at [facility] OR    FUSION SPINE POSTERIOR - CERVICAL BELOW C2 N/A 08/21/2023    Performed by Christel Fonda DASEN, MD at [facility] OR    FUSION SPINE POSTERIOR - LUMBAR - EACH ADDITIONAL SEGMENT N/A 08/21/2023  Performed by Christel Fonda DASEN, MD at [facility] OR    ABDOMEN SURGERY      DILATION AND CURETTAGE  1980's    HX TUBAL LIGATION      LAP CHOLECYSTECTOMY      SPINE SURGERY      TILT TABLE STUDY         family history includes Alcohol liver disease in her brother; Arthritis in her father; Asthma in her sister; Back pain in her brother; COPD in her brother; Cancer in her sister, sister and another family member; Coronary Artery Disease in her father, mother, and other family members; Diabetes in her brother and mother; Hypertension in her father; Joint Pain in her sister; Neck Pain in her sister; Osteoporosis in her sister; Premature Heart Disease in an other family member; Stroke in an other family member.    Social History     Socioeconomic History    Marital status: Single   Tobacco Use    Smoking status: Some Days     Current packs/day: 0.00     Average packs/day: 0.3 packs/day for 60.2 years (16.1 ttl pk-yrs)     Types: Cigarettes     Last attempt to quit: 03/12/2023     Years since quitting: 1.3     Passive exposure: Past    Smokeless tobacco: Never    Tobacco comments:     variable 1-1/2 a day, usually 1 or 2 per day.   Vaping Use    Vaping status: Some Days    Substances: Flavoring   Substance and Sexual Activity    Alcohol use: Not Currently     Alcohol/week: 0.0 - 1.0 standard drinks of alcohol     Comment: social    Drug use: Yes     Types: Marijuana     Comment: Gummies THC, last meth use July 2024    Sexual activity: Not Currently     Partners: Male     Birth control/protection: Condom, None       Allergies[1]    There were no vitals filed for this visit.          REVIEW OF SYSTEMS: 10 point ROS obtained and negative except for back pain      PHYSICAL EXAM:  General: 63 y.o. female appears stated age, in no acute distress  HEENT: Normocephalic, atraumatic  Neck: No thyroidmegaly  Cardiovascular: Well perfused  Pulmonary: Unlabored respirations  Extremities: No cyanosis, clubbing, or edema  Skin: No lesions seen on exposed skin  Psychiatric:  Appropriate mood and affect  Musculoskeletal: No atrophy.   Neurologic: Antigravity strength in all extremities. CN II -XII grossly intact.  Alert and oriented x 3.         IMPRESSION:    1. Compression fracture of T9 vertebra with routine healing, subsequent encounter    2. Thoracic spondylosis         PLAN:   Bilateral T7-T9 RFA    General Pre Procedural Sedation ASA Classification      I have discussed risks and alternatives of this type of sedation and procedure with: patient    NPO Status:Acceptable    Pregnancy Status: No    Prior Anesthetic Types: General and Anxiolysis    Patient's had previous experience with anesthesia and/or sedation complications: No    Family history of sedation complications: No    Airway: Airway assessment performed Mallampati  II (soft palate, uvula, fauces visible)    Head and Neck: No abnormalities noted  Mouth: No abnormalities noted    Medications for Procedural Sedation: Midazolam  and Fentanyl     Anesthesia Classification:  ASA II (A normal patient with mild systemic disease)    Patient remains a candidate for procedure: Yes    The intention for the procedure today is Anxiolysis.                          [1]   Allergies  Allergen Reactions    Erythromycin RASH     Rash on trunk    Strawberry RASH    Ascorbic Acid NAUSEA ONLY and UNKNOWN

## 2024-07-15 NOTE — Procedures
 Attending Surgeon: Albert JONELLE Sharps, MD    Anesthesia: Local    Pre-Procedure Diagnosis:   1. Thoracic spondylosis        Post-Procedure Diagnosis:   1. Thoracic spondylosis             Destruction of Nerve w/Fluoro Cervical/Thoracic    Laterality: bilateral    Location: Cervical/Thoracic - T6, T7 and T8      Consent:   Consent obtained: verbal and written  Consent given by: patient  Risks discussed: bleeding, bruising, infection and nerve damage    Discussed with patient the purpose of the treatment/procedure, other ways of treating my condition, including no treatment/ procedure and the risks and benefits of the alternatives. Patient has decided to proceed with treatment/procedure.        Universal Protocol:  Relevant documents: relevant documents present and verified  Test results: test results available and properly labeled  Imaging studies: imaging studies available  Required items: required blood products, implants, devices, and special equipment available  Site marked: the operative site was marked  Patient identity confirmed: Patient identify confirmed verbally with patient.        Time out: Immediately prior to procedure a time out was called to verify the correct patient, procedure, equipment, support staff and site/side marked as required      Patient has had 2 Medial Branch Blocks with greater than 80% relief that lasted more than 1 hour which is consistent with the local agent used.     Procedures Details:   Indications: pain     Prep: Betadine  Local anesthetic:  0.25% bupivacaine  - 0.37mL and 0.5% bupivacaine  - 0.5mL  Sedation: anxiolysis  Sedation Medications: Versed  2 mg and Fentanyl  50 mcg  Patient position: prone  Estimated Blood Loss: minimal  Specimens: none  Number of Levels Ablated: 2  Guidance: fluoroscopy  Needle size: 18 G  Active Needle Tip Length: 10mm  Neurolytic Technique: Radiofrequency Ablation    Motor Testing: Motor testing complete per protocol   Patient tolerance: Patient tolerated the procedure well with no immediate complications. Pressure was applied, and hemostasis was accomplished.  Comments: INTERVENTIONAL PAIN MANAGEMENT PROCEDURE REPORT    Thoracic Medial Branch Nerve Radiofrequency Ablation    Procedure Title(s):    1. Radiofrequency ablation of the bilateral T6 - T8 medial branch nerves   2. Intraoperative fluoroscopy    Attending Surgeon: Albert Sharps, MD    Anesthesia: Local                       Anxiolysis Yes      Indications:  The patient's history and physical exam were reviewed. The risks, benefits and alternatives to the procedure were discussed, and all questions were answered to the patient's satisfaction. The patient agreed to proceed, and written informed consent was obtained.     Procedure in Detail: IV was started? Yes    The patient was brought into the procedure room and placed in the prone position on the fluoroscopy table. Standard monitors were placed, and vital signs were observed throughout the procedure. The area of the thoracic spine was prepped with chlorhexidine  and draped in a sterile manner.     Using AP fluoroscopy, the superior border of the transverse process of the right T7 - T9 levels were identified and marked. A 18-gauge, 3.5 inch, 10 mm active tip radiofrequency probe was directed towards the targeted point under fluoroscopy until bone was contacted. Multiple views of AP, lateral, and  oblique fluoroscopy were used to confirm correct needle placement.  Prior to lesioning, 0.5 mL of 0.25% bupivacaine  and 0.5 mL of 1 lidocaine  was injected. Lesioning was carried out at 80 degrees Celsius for 90 seconds. After lesioning, the needles were removed, the patient's back cleaned, and bandages placed over the sites of needle insertion.  Attention was taken to the left side where the injection was performed in similar manner.     Disposition: The patient tolerated the procedure well, and there were no apparent complications. Vital signs remained stable througtout the procedure. The patient was taken to the recovery area where discharge instructions for the procedure were given.    Estimated Blood Loss: minimal    Specimens: none    Complications: None           Motor Voltage 2  Radiofrequency time 90  Radiofrequency Temperature 80      Administrations This Visit       bupivacaine  PF (MARCAINE ) 0.25 % injection 10 mL       Admin Date  07/15/2024 Action  Given Dose  10 mL Route  Injection Documented By  Sharl Mom, RN              fentaNYL  citrate PF (SUBLIMAZE ) injection 50 mcg       Admin Date  07/15/2024 Action  Given Dose  50 mcg Route  Intravenous Documented By  Harvey Lacks, RN               Admin Date  07/15/2024 Action  Given Dose  50 mcg Route  Intravenous Documented By  Harvey Lacks, RN              lidocaine  PF 1% (10 mg/mL) injection 10 mL       Admin Date  07/15/2024 Action  Given Dose  10 mL Route  Injection Documented By  Sharl Mom, RN              midazolam  (VERSED ) injection 1 mg       Admin Date  07/15/2024 Action  Given Dose  2 mg Route  Intravenous Documented By  Harvey Lacks, RN                  Estimated blood loss: none or minimal  Specimens: none  Patient tolerated the procedure well with no immediate complications. Pressure was applied, and hemostasis was accomplished.

## 2024-07-15 NOTE — Discharge Instructions - Supplementary Instructions
 RADIOFREQUENCY ABLATION   DISCHARGE INSTRUCTIONS    Go directly home and rest. DO NOT drive today.  If you have a dressing or bandage on, you may remove it in 12 hours. You may shower tomorrow.   Apply ice to the procedure site at 20-minute intervals frequently for the next 24 hours.  If you had an IV for your procedure and a lump or redness occurs at the site, you may apply a warm, moist compress for four times daily for 2-3 days.  If you had sedation for your procedure:  The anesthetic may make you drowsy and slow to react for up to 12 hours.  For the next 12 hours do not consume alcohol, drive, operate machinery, sign legal documents, or work.  Rest at home today.  A responsible adult needs to stay with you today and overnight.  Start with clear liquids and advance as tolerated.  Resume all previous medication unless directed not to.  It is not uncommon to experience an increase in pain for several days after the procedure.  Some people may experience a ?sunburn-like? sensation for a week in the area of the ablation.  If it becomes bothersome despite over the counter NSAIDs (if you can take these), call for further advice.  Ease back into your activities and daily routine as tolerated over the next 2-3 days.  The beneficial effects from the radiofrequency procedure may take several weeks to be noticed.  There should be minimal drainage and no swelling or redness at the injection sites. You should not experience a severe headache. You should not run a fever over 101?F. If any of these occur, call to report this to the nurse of Dr. Katrinka Blazing at [phone]. If you are calling after 4PM or on weekends/holidays, call [phone] and ask to have the on-call pain management resident paged or go to your local emergency room.  Follow up appointment as needed.     If you have questions for the surgery center, call [facility] at [phone].

## 2024-07-21 ENCOUNTER — Encounter: Admit: 2024-07-21 | Discharge: 2024-07-21 | Payer: MEDICARE

## 2024-07-22 ENCOUNTER — Encounter: Admit: 2024-07-22 | Discharge: 2024-07-22 | Payer: MEDICARE

## 2024-07-23 ENCOUNTER — Encounter: Admit: 2024-07-23 | Discharge: 2024-07-23 | Payer: MEDICARE
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
## 2024-07-29 ENCOUNTER — Encounter: Admit: 2024-07-29 | Discharge: 2024-07-29 | Payer: MEDICARE

## 2024-07-29 NOTE — Telephone Encounter [36]
 An encounter has been created for documentation only (often for preparation of an upcoming appointment or for follow up on orders/imaging) and patient does not need contact RN and did not miss a phone call or appointment.     RN prepped patient information for clinic with Randall Benders APRN NP on 08/04/24 at 1500 via in person appointment.     Here for 6 mo follow up  Dx:OSA, RLS  Pt known to sleep department by Dr. Blima     Last SS: 08/26/22 with AHI 12.9  Oximetry 01/10/23  Previous cpap 8-18     Smoking a couple of cigarettes a day, on chantix    History of asthma- on Wixela      LV: 11/20/23 with Randall Benders   Meds: albuterol , flonase , advair  are all on med list     RLS  Meds:lyrica -managed by other provider     Tobacco abuse   -LDCT screening thru pcp.    Pt to ED on 07/22/24 for back pain      Pt hospitalized from 4/14-4/30/25 at [facility] for chronic low back pain.      IFZ:Opwrjmz Deitra Pac  RN obtained download from [place]

## 2024-07-30 ENCOUNTER — Encounter: Admit: 2024-07-30 | Discharge: 2024-07-30 | Payer: MEDICARE

## 2024-07-31 ENCOUNTER — Encounter: Admit: 2024-07-31 | Discharge: 2024-07-31 | Payer: MEDICARE

## 2024-07-31 ENCOUNTER — Ambulatory Visit: Admit: 2024-07-31 | Discharge: 2024-08-01 | Payer: MEDICARE

## 2024-07-31 NOTE — Progress Notes [1]
 Date of Service: 08/04/2024    Subjective:             Kristen Vaughn is a 63 y.o. female.    History of Present Illness  I had the pleasure of seeing Kristen Vaughn in clinic for OSA follow-up. She is known to Dr. Blima. She has mild OSA (AHI 12) and is on CPAP 5-15. 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 a theater or a meeting) 0 0 0 0 0 2   As a passenger in a car for an hour without a break 0 0 0 0 0 2   Lying down to rest in the afternoon when circumstances permit 1 2 1 1  0 2   Sitting and talking to someone 0 0 0 0 0 1   Sitting quietly after a lunch without alcohol 0 0 0 0 2 2   In a car, while stopped for a few minutes in traffic 0 0 0 0 0 0   Epworth Sleepiness Scale Score 2  4  3  1 2 14        Patient-reported                   Objective:         acetaminophen  SR (TYLENOL  8 HOUR) 650 mg tablet Take one tablet by mouth every 8 hours as needed for Pain.    albuterol  sulfate (PROAIR  HFA) 90 mcg/actuation HFA aerosol inhaler Inhale two puffs by mouth into the lungs every 6 hours as needed for Wheezing or Shortness of Breath. Shake well before use.    buPROPion HCL SR (WELLBUTRIN SR) 150 mg tablet, 12 hr sustained-release     calcium  carbonate (CALCIUM  600 PO) Take 1 tablet by mouth daily.    celecoxib  (CELEBREX ) 200 mg capsule Take one capsule by mouth daily.    chlorhexidine  gluconate (PERIDEX ) 0.12 % mouthwash Swish and Spit 15 mL by mouth as directed twice daily.    CHOLEcalciferoL  (vitamin D3) 50 mcg (2,000 unit) tablet Take one tablet by mouth daily.    clonazePAM  (KLONOPIN ) 1 mg tablet Take one tablet by mouth daily as needed.    denosumab (PROLIA) 60 mg/mL injection Inject 1 mL under the skin every 180 days.    duloxetine  DR (CYMBALTA ) 20 mg capsule Take two capsules by mouth daily.    fluticasone  propionate (FLONASE  ALLERGY RELIEF) 50 mcg/actuation nasal spray, suspension Apply two sprays to each nostril as directed daily.  fluticasone -salmeterol (ADVAIR DISKUS) 250-50 mcg/dose inhalation disk Inhale one puff by mouth into the lungs twice daily.    HYDROcodone /acetaminophen  (NORCO) 5/325 mg tablet Take one tablet by mouth every 12 hours as needed for Pain.    lidocaine  (BLUE-EMU LIDOCAINE  PATCH) 4 % topical patch Apply one patch topically to affected area daily.    lidocaine /prilocaine  (EMLA ) 2.5/2.5 % topical cream APPLY TOPICALLY TO AFFECTED AREA AS NEEDED.    lisinopriL  (ZESTRIL ) 40 mg tablet Take one tablet by mouth daily. for high blood pressure    Magnesium  250 mg tab Take one tablet by mouth daily.    mirtazapine  (REMERON ) 30 mg tablet Take one tablet by mouth at bedtime daily.    multivitamin (ONE-A-DAY) tablet Take one tablet by mouth daily.    omeprazole DR(+) (PRILOSEC) 40 mg capsule Take one capsule by mouth twice daily.    pregabalin  (LYRICA ) 200 mg capsule TAKE 1 CAPSULE BY MOUTH THREE TIMES A DAY    rosuvastatin  (CRESTOR ) 10 mg tablet TAKE 1 TABLET BY MOUTH EVERY DAY    tiZANidine  (ZANAFLEX ) 4 mg tablet TAKE 1 TABLET BY MOUTH THREE TIMES A DAY    topiramate (TOPAMAX) 100 mg tablet Take one tablet by mouth twice daily (at 10AM and 10PM).    walker (ULTRA-LIGHT ROLLATOR) medical supply Use as directed.     Vitals:    08/04/24 1458   BP: 97/76   BP Source: Arm, Right Upper   Pulse: 78   Temp: 36.8 ?C (98.2 ?F) Resp: 16   SpO2: 98%   TempSrc: Oral   Weight: 93 kg (205 lb)   Height: 170.2 cm (5' 7)     Body mass index is 32.11 kg/m?SABRA     Physical Exam  Vitals reviewed.   Constitutional:       Appearance: Normal appearance.   Pulmonary:      Effort: Pulmonary effort is normal. No respiratory distress.   Skin:     General: Skin is warm and dry.   Neurological:      Mental Status: She is alert and oriented to person, place, and time.   Psychiatric:         Mood and Affect: Mood normal.         Behavior: Behavior normal.         Thought Content: Thought content normal.         Judgment: Judgment normal.                Assessment and Plan:    Problem   Osa (Obstructive Sleep Apnea)    HST 08/26/2022  BMI 38 243lb  AHI (4%) 12.9 supine 22.5  O2 low 72.7%, TIme <88% 306 minutes    Pt set up with AutoPAP @ 5-15cm H2O on 12/06/22.  DME: Camelia [facility]is [facility] / [facility]  Insurance: [employer]  If the patient has to meet compliance, their compliance window will be from 01/06/23 to 03/06/23.     Oximetry 01/10/2023  On CPAP 5-15  Below 88% for <1 minute  Oxygen Nadir 87%          OSA (obstructive sleep apnea)  Reviewed CPAP download. 6/30 days of usage.   Wearing 4 hours or greater 7% of nights. The average use on days used was 3 hours and 43 minutes.  Residual AHI 1.4.    The 95th percentile pressure was 10.5 cmH20, with a maximum of 12.0 cmH20, and a median of 8.6 cmH20.  The median leak was 2.2  L/min. She is tolerating and benefiting from therapy.     - Will continue current settings, CPAP 8-18. Ramp turned off. DME order placed for mask refit, recommend foam full face mask. Encouraged nightly usage.      - Keep in contact with DME for routine supplies, need to contact us  directly if unable to communicate with DME in a timely manner.     She has our contact information and was encouraged to call us  with any questions or concerns.    RTC in 6 months with Dr. Blima per staff policy    Orders Placed This Encounter    SLEEP STUDY    AMB REFERRAL TO HOME CARE     Future Appointments   Date Time Provider Department Center   08/12/2024  1:30 PM Christel Fonda DASEN, MD [facility] SPINE       Total time - 35 minutes

## 2024-08-04 ENCOUNTER — Encounter: Admit: 2024-08-04 | Discharge: 2024-08-04 | Payer: MEDICARE

## 2024-08-04 ENCOUNTER — Ambulatory Visit: Admit: 2024-08-04 | Discharge: 2024-08-05 | Payer: MEDICARE

## 2024-08-04 VITALS — BP 97/76 | HR 78 | Temp 98.20000°F | Resp 16 | Ht 67.0 in | Wt 205.0 lb

## 2024-08-04 DIAGNOSIS — G4733 Obstructive sleep apnea (adult) (pediatric): Principal | ICD-10-CM

## 2024-08-06 ENCOUNTER — Encounter: Admit: 2024-08-06 | Discharge: 2024-08-06 | Payer: MEDICARE

## 2024-08-06 DIAGNOSIS — Z981 Arthrodesis status: Principal | ICD-10-CM

## 2024-08-12 ENCOUNTER — Ambulatory Visit: Admit: 2024-08-12 | Discharge: 2024-08-12 | Payer: MEDICARE

## 2024-08-12 ENCOUNTER — Encounter: Admit: 2024-08-12 | Discharge: 2024-08-12 | Payer: MEDICARE

## 2024-08-12 MED ORDER — ROSUVASTATIN 10 MG PO TAB
10 mg | ORAL_TABLET | Freq: Every day | ORAL | 2 refills | 90.00000 days | Status: AC
Start: 2024-08-12 — End: ?

## 2024-08-14 ENCOUNTER — Ambulatory Visit: Admit: 2024-08-14 | Discharge: 2024-08-15 | Payer: MEDICARE

## 2024-08-14 ENCOUNTER — Encounter: Admit: 2024-08-14 | Discharge: 2024-08-14 | Payer: MEDICARE

## 2024-08-14 DIAGNOSIS — M47814 Spondylosis without myelopathy or radiculopathy, thoracic region: Secondary | ICD-10-CM

## 2024-08-14 DIAGNOSIS — M7918 Myalgia, other site: Secondary | ICD-10-CM

## 2024-08-14 DIAGNOSIS — M546 Pain in thoracic spine: Secondary | ICD-10-CM

## 2024-08-14 MED ORDER — METHOCARBAMOL 500 MG PO TAB
500-1000 mg | ORAL_TABLET | Freq: Three times a day (TID) | ORAL | 2 refills | 10.00000 days | Status: AC | PRN
Start: 2024-08-14 — End: ?
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
## 2024-08-19 ENCOUNTER — Encounter: Admit: 2024-08-19 | Discharge: 2024-08-19 | Payer: MEDICARE

## 2024-08-19 NOTE — Telephone Encounter [36]
 LOV 08/04/24 with pressure set at 8-18

## 2024-08-19 NOTE — Telephone Encounter [36]
-----   Message from Logan E sent at 08/05/2024  8:53 AM CST -----  1500 from tuesday 11/11- please get download in 2 weeks

## 2024-08-24 ENCOUNTER — Encounter: Admit: 2024-08-24 | Discharge: 2024-08-24 | Payer: MEDICARE

## 2024-08-26 ENCOUNTER — Encounter: Admit: 2024-08-26 | Discharge: 2024-08-26 | Payer: MEDICARE

## 2024-08-28 ENCOUNTER — Encounter: Admit: 2024-08-28 | Discharge: 2024-08-28 | Payer: MEDICARE

## 2024-09-01 ENCOUNTER — Encounter: Admit: 2024-09-01 | Discharge: 2024-09-01 | Payer: MEDICARE

## 2024-09-30 ENCOUNTER — Encounter: Admit: 2024-09-30 | Discharge: 2024-09-30 | Payer: MEDICARE

## 2024-10-20 ENCOUNTER — Encounter: Admit: 2024-10-20 | Discharge: 2024-10-20 | Payer: MEDICARE

## 2024-10-21 ENCOUNTER — Encounter: Admit: 2024-10-21 | Discharge: 2024-10-21 | Payer: MEDICARE

## 2024-10-21 DIAGNOSIS — M7918 Myalgia, other site: Secondary | ICD-10-CM

## 2024-10-21 DIAGNOSIS — M5416 Radiculopathy, lumbar region: Principal | ICD-10-CM

## 2024-10-27 ENCOUNTER — Encounter: Admit: 2024-10-27 | Discharge: 2024-10-27 | Payer: MEDICARE
